# Patient Record
Sex: Male | Born: 1950 | Hispanic: No | Marital: Married | Smoking: Former smoker
Health system: Southern US, Community
[De-identification: ages and names within clinical notes are randomized; demographics above are authoritative.]

## PROBLEM LIST (undated history)

## (undated) DIAGNOSIS — I10 Essential (primary) hypertension: Secondary | ICD-10-CM

## (undated) DIAGNOSIS — T7840XA Allergy, unspecified, initial encounter: Secondary | ICD-10-CM

## (undated) DIAGNOSIS — K219 Gastro-esophageal reflux disease without esophagitis: Secondary | ICD-10-CM

## (undated) DIAGNOSIS — I2510 Atherosclerotic heart disease of native coronary artery without angina pectoris: Secondary | ICD-10-CM

## (undated) DIAGNOSIS — E785 Hyperlipidemia, unspecified: Secondary | ICD-10-CM

## (undated) DIAGNOSIS — E119 Type 2 diabetes mellitus without complications: Secondary | ICD-10-CM

## (undated) DIAGNOSIS — E11319 Type 2 diabetes mellitus with unspecified diabetic retinopathy without macular edema: Secondary | ICD-10-CM

## (undated) HISTORY — DX: Essential (primary) hypertension: I10

## (undated) HISTORY — PX: CARDIAC CATHETERIZATION: SHX172

## (undated) HISTORY — DX: Hyperlipidemia, unspecified: E78.5

## (undated) HISTORY — DX: Gastro-esophageal reflux disease without esophagitis: K21.9

## (undated) HISTORY — DX: Type 2 diabetes mellitus without complications: E11.9

## (undated) HISTORY — DX: Type 2 diabetes mellitus with unspecified diabetic retinopathy without macular edema: E11.319

## (undated) HISTORY — DX: Allergy, unspecified, initial encounter: T78.40XA

## (undated) HISTORY — DX: Atherosclerotic heart disease of native coronary artery without angina pectoris: I25.10

---

## 2004-09-01 ENCOUNTER — Ambulatory Visit: Payer: Self-pay | Admitting: Family Medicine

## 2004-09-01 ENCOUNTER — Ambulatory Visit: Payer: Self-pay | Admitting: *Deleted

## 2004-10-12 ENCOUNTER — Ambulatory Visit: Payer: Self-pay | Admitting: Family Medicine

## 2009-02-03 ENCOUNTER — Ambulatory Visit: Payer: Self-pay | Admitting: Internal Medicine

## 2009-02-04 ENCOUNTER — Encounter (INDEPENDENT_AMBULATORY_CARE_PROVIDER_SITE_OTHER): Payer: Self-pay | Admitting: Adult Health

## 2009-02-04 LAB — CONVERTED CEMR LAB
ALT: 19 units/L (ref 0–53)
Alkaline Phosphatase: 127 units/L — ABNORMAL HIGH (ref 39–117)
Basophils Absolute: 0 10*3/uL (ref 0.0–0.1)
Basophils Relative: 1 % (ref 0–1)
Chlamydia, Swab/Urine, PCR: NEGATIVE
Creatinine, Ser: 0.87 mg/dL (ref 0.40–1.50)
Eosinophils Absolute: 0.2 10*3/uL (ref 0.0–0.7)
MCHC: 33 g/dL (ref 30.0–36.0)
MCV: 82.1 fL (ref 78.0–100.0)
Monocytes Relative: 9 % (ref 3–12)
Neutro Abs: 5.5 10*3/uL (ref 1.7–7.7)
Neutrophils Relative %: 62 % (ref 43–77)
PSA: 0.18 ng/mL (ref 0.10–4.00)
Platelets: 216 10*3/uL (ref 150–400)
RBC: 5.02 M/uL (ref 4.22–5.81)
RDW: 13.8 % (ref 11.5–15.5)
Sodium: 136 meq/L (ref 135–145)
Total Bilirubin: 0.4 mg/dL (ref 0.3–1.2)
Total Protein: 7.4 g/dL (ref 6.0–8.3)

## 2009-02-05 ENCOUNTER — Ambulatory Visit: Payer: Self-pay | Admitting: Internal Medicine

## 2009-02-18 ENCOUNTER — Ambulatory Visit: Payer: Self-pay | Admitting: Internal Medicine

## 2009-02-18 ENCOUNTER — Encounter (INDEPENDENT_AMBULATORY_CARE_PROVIDER_SITE_OTHER): Payer: Self-pay | Admitting: Adult Health

## 2009-02-18 LAB — CONVERTED CEMR LAB
AST: 16 units/L (ref 0–37)
Albumin: 4 g/dL (ref 3.5–5.2)
Alkaline Phosphatase: 84 units/L (ref 39–117)
BUN: 14 mg/dL (ref 6–23)
Calcium: 9.3 mg/dL (ref 8.4–10.5)
Chloride: 102 meq/L (ref 96–112)
Glucose, Bld: 204 mg/dL — ABNORMAL HIGH (ref 70–99)
Potassium: 4.4 meq/L (ref 3.5–5.3)
Sodium: 138 meq/L (ref 135–145)
Total Protein: 6.5 g/dL (ref 6.0–8.3)

## 2009-02-19 ENCOUNTER — Encounter (INDEPENDENT_AMBULATORY_CARE_PROVIDER_SITE_OTHER): Payer: Self-pay | Admitting: Adult Health

## 2009-02-19 LAB — CONVERTED CEMR LAB
HDL: 45 mg/dL (ref >39)
LDL Cholesterol: 29 mg/dL (ref 0–99)
Triglycerides: 60 mg/dL (ref <150)

## 2009-05-05 ENCOUNTER — Ambulatory Visit: Payer: Self-pay | Admitting: Internal Medicine

## 2009-05-14 ENCOUNTER — Encounter: Payer: Self-pay | Admitting: Internal Medicine

## 2009-05-14 ENCOUNTER — Ambulatory Visit (HOSPITAL_COMMUNITY): Admission: RE | Admit: 2009-05-14 | Discharge: 2009-05-14 | Payer: Self-pay | Admitting: Internal Medicine

## 2009-06-04 ENCOUNTER — Ambulatory Visit: Payer: Self-pay | Admitting: Internal Medicine

## 2009-08-04 ENCOUNTER — Encounter (INDEPENDENT_AMBULATORY_CARE_PROVIDER_SITE_OTHER): Payer: Self-pay | Admitting: Adult Health

## 2009-08-04 ENCOUNTER — Ambulatory Visit: Payer: Self-pay | Admitting: Internal Medicine

## 2009-08-04 LAB — CONVERTED CEMR LAB
ALT: 19 units/L (ref 0–53)
CO2: 23 meq/L (ref 19–32)
Calcium: 9 mg/dL (ref 8.4–10.5)
Chloride: 107 meq/L (ref 96–112)
Cholesterol: 82 mg/dL (ref 0–200)
Glucose, Bld: 212 mg/dL — ABNORMAL HIGH (ref 70–99)
HDL: 33 mg/dL — ABNORMAL LOW (ref >39)
Helicobacter Pylori Antibody-IgG: <0.4
LDL Cholesterol: 1 mg/dL (ref 0–99)
Sodium: 140 meq/L (ref 135–145)
Total Bilirubin: 0.3 mg/dL (ref 0.3–1.2)
Total CHOL/HDL Ratio: 2.5
Total Protein: 6.8 g/dL (ref 6.0–8.3)
Triglycerides: 239 mg/dL — ABNORMAL HIGH (ref <150)
VLDL: 48 mg/dL — ABNORMAL HIGH (ref 0–40)

## 2009-09-21 ENCOUNTER — Ambulatory Visit: Payer: Self-pay | Admitting: Internal Medicine

## 2009-12-14 ENCOUNTER — Ambulatory Visit: Payer: Self-pay | Admitting: Internal Medicine

## 2009-12-24 ENCOUNTER — Ambulatory Visit: Payer: Self-pay | Admitting: Internal Medicine

## 2010-01-13 ENCOUNTER — Ambulatory Visit: Payer: Self-pay | Admitting: Internal Medicine

## 2010-02-11 ENCOUNTER — Ambulatory Visit: Payer: Self-pay | Admitting: Internal Medicine

## 2010-02-11 ENCOUNTER — Encounter (INDEPENDENT_AMBULATORY_CARE_PROVIDER_SITE_OTHER): Payer: Self-pay | Admitting: Adult Health

## 2010-02-11 LAB — CONVERTED CEMR LAB
ALT: 19 units/L (ref 0–53)
AST: 19 units/L (ref 0–37)
Alkaline Phosphatase: 84 units/L (ref 39–117)
Creatinine, Ser: 0.93 mg/dL (ref 0.40–1.50)
Total Bilirubin: 0.4 mg/dL (ref 0.3–1.2)
Total CHOL/HDL Ratio: 2.7
VLDL: 15 mg/dL (ref 0–40)

## 2010-04-14 ENCOUNTER — Ambulatory Visit: Payer: Self-pay | Admitting: Internal Medicine

## 2010-04-14 ENCOUNTER — Encounter (INDEPENDENT_AMBULATORY_CARE_PROVIDER_SITE_OTHER): Payer: Self-pay | Admitting: Adult Health

## 2010-04-14 LAB — CONVERTED CEMR LAB: Microalb, Ur: 2.75 mg/dL — ABNORMAL HIGH (ref 0.00–1.89)

## 2010-08-26 ENCOUNTER — Encounter (INDEPENDENT_AMBULATORY_CARE_PROVIDER_SITE_OTHER): Payer: Self-pay | Admitting: *Deleted

## 2010-08-26 LAB — CONVERTED CEMR LAB
Calcium: 9.2 mg/dL (ref 8.4–10.5)
Creatinine, Ser: 1.13 mg/dL (ref 0.40–1.50)
Hgb A1c MFr Bld: 9.9 % — ABNORMAL HIGH (ref <5.7)
Sodium: 133 meq/L — ABNORMAL LOW (ref 135–145)

## 2015-05-13 ENCOUNTER — Ambulatory Visit (INDEPENDENT_AMBULATORY_CARE_PROVIDER_SITE_OTHER): Payer: Medicaid Other | Admitting: Family Medicine

## 2015-05-13 ENCOUNTER — Encounter: Payer: Self-pay | Admitting: Family Medicine

## 2015-05-13 VITALS — BP 112/58 | HR 76 | Temp 98.5°F | Ht 68.0 in | Wt 147.0 lb

## 2015-05-13 DIAGNOSIS — Z7189 Other specified counseling: Secondary | ICD-10-CM | POA: Diagnosis present

## 2015-05-13 DIAGNOSIS — E785 Hyperlipidemia, unspecified: Secondary | ICD-10-CM | POA: Diagnosis not present

## 2015-05-13 DIAGNOSIS — I10 Essential (primary) hypertension: Secondary | ICD-10-CM

## 2015-05-13 DIAGNOSIS — E1169 Type 2 diabetes mellitus with other specified complication: Secondary | ICD-10-CM | POA: Diagnosis not present

## 2015-05-13 DIAGNOSIS — R05 Cough: Secondary | ICD-10-CM

## 2015-05-13 DIAGNOSIS — J302 Other seasonal allergic rhinitis: Secondary | ICD-10-CM | POA: Diagnosis not present

## 2015-05-13 DIAGNOSIS — Z7689 Persons encountering health services in other specified circumstances: Secondary | ICD-10-CM

## 2015-05-13 DIAGNOSIS — K219 Gastro-esophageal reflux disease without esophagitis: Secondary | ICD-10-CM | POA: Insufficient documentation

## 2015-05-13 DIAGNOSIS — R059 Cough, unspecified: Secondary | ICD-10-CM | POA: Insufficient documentation

## 2015-05-13 DIAGNOSIS — E11319 Type 2 diabetes mellitus with unspecified diabetic retinopathy without macular edema: Secondary | ICD-10-CM

## 2015-05-13 NOTE — Progress Notes (Signed)
Patient ID: Alec York, male   DOB: Dec 08, 1950, 64 y.o.   MRN: [id number]    Subjective: XO:VANVBTYO HPI: Patient is a 64 y.o. male presenting to clinic today to establish care/diabetes management. Concerns today include:  1. Diabetes:  High at home: 315 Low at home: 150 Taking medications: Novolog 12u BID, Lantus 50u qhs, Amaryl 4mg  BID, Zocor 40, Lisinopril 40 Side effects: Last hypoglycemic episode ~4 months ago.   ROS: denies fever, chills, dizziness, LOC, polyuria, polydipsia, or chest pain. Endorses occasional numbness and tingling in LE but no ulcers or callus formation.  Patient has diabetic retinopathy and is followed by an eye doctor. Last eye exam: 08/2014 Last foot exam: 03/18/15 Last A1c: 12.8 (03/18/15) Nephropathy screen indicated?: no on ACE-I Last flu, zoster and/or pneumovax: flu shot last year, also has received pneumonia shot at some point.  Unsure of zoster.   2. Cough: Patient has had an ongoing dry cough for about 15 days.  Has used a cough medicine with DM in it with little relief.  He denies fevers, chills, weight loss, sick contacts, CP, SOB, wheeze, rhinorrhea.  Former smoker.  Exercises daily without difficulty.  Social History Reviewed: former smoker. FamHx and MedHx updated.  Please see EMR.  ROS: All other systems reviewed and are negative.  Objective: Office vital signs reviewed. BP 112/58 mmHg  Pulse 76  Temp(Src) 98.5 F (36.9 C) (Oral)  Ht 5\' 8"  (1.727 m)  Wt 147 lb (66.679 kg)  BMI 22.36 kg/m2  Physical Examination:  General: Awake, alert, well nourished, NAD HEENT: Normal, EOMI, MMM, poor dentition with caries and teeth missing Neck: supple, no LAD Cardio: RRR, S1S2 heard, no murmurs appreciated Pulm: CTAB, no wheezes, rhonchi or rales GI: soft, NT/ND,+BS x4, no hepatomegaly, no splenomegaly Extremities: WWP, No edema, cyanosis or clubbing; +2 pulses bilaterally MSK: Normal gait and station Skin: dry, intact, no rashes or  lesions  Assessment: 64 y.o. male with T2DM, HTN, HLD, diabetic retinopathy here to establish care.  Plan: See Problem List and After Visit Summary   Janora Norlander, DO PGY-1, [place]

## 2015-05-13 NOTE — Patient Instructions (Addendum)
It was a pleasure seeing you today, Mr Drone!  Information regarding what we discussed is included in this packet.  Please make an appointment to see me in 2 months for diabetes follow up.  We will check your hemoglobin A1c at that time.  In the meantime, continue taking your Diabetes medications as directed.  Continue yoga and diet modification.  We may need to refer you to an endocrinologist if your A1c has not improved on current therapies.  Take honey for your cough.  You may use Robitussin (PLAIN, no DM) for cough if honey does not help.  Please feel free to call our office at [phone] if any questions or concerns arise.  Warm Regards, Ashly M. Lajuana Ripple, DO

## 2015-05-13 NOTE — Assessment & Plan Note (Signed)
15 days.  No URI signs.  Possibly allergy related.  Pt with h/o smoking.  If not improved, would consider imaging.  No red flags at this time. -Honey.  If no relief with honey, use Robitussin PLAIN.  Avoid D and DM in setting of HTN -Return precautions reviewed.

## 2015-05-13 NOTE — Assessment & Plan Note (Signed)
Controlled.  Somewhat soft for age [age]. -Continue Lisinopril 40mg  for now.   -Will consider decreasing this if continued soft pressures at next visit. -Continue Yoga and diet modification -Return in 2 months for T2DM check

## 2015-05-13 NOTE — Assessment & Plan Note (Signed)
Last A1c 12.8 02/2015.  Lipid panel WNL.  Zocor 40mg , Lisinopril 40mg , Novolog, Lantus, Amaryl.  Not on aspirin.  No lows. -Would recommend addition of Aspirin 81mg  for primary prevention -Continue current therapies.   -Would consider changing Zocor to a statin with less medication interactions.  Patient tolerating medication. -Will repeat A1c in 2 months.   -Discussed consideration for endocrinologist referral if A1c continues to be high on multitherapies -Patient signed release of information form.  Would like to know what medications have tried/responses/failures/ which vaccines needed, etc -Will administer pna vaccine and give Rx for Zoster if not obtained -Patient to schedule appt with opthalomologist for continued f/u  -Return in 2 months for diabetes management.

## 2015-05-26 ENCOUNTER — Telehealth: Payer: Self-pay | Admitting: Family Medicine

## 2015-05-26 ENCOUNTER — Other Ambulatory Visit: Payer: Self-pay | Admitting: Family Medicine

## 2015-05-26 DIAGNOSIS — Z1211 Encounter for screening for malignant neoplasm of colon: Secondary | ICD-10-CM

## 2015-05-26 NOTE — Telephone Encounter (Signed)
Patient is calling about a gastroenterology (GI) appt that was supposed to be made for him. He says that he has been waiting on a phone call about this. He attempted to make the appt himself but he needs the referral/authorization placed for his insurance. Thank you, Fonda Kinder, ASA

## 2015-05-26 NOTE — Telephone Encounter (Signed)
PT informed that referral has been placed and that someone will be in contact with him. Katharina Caper, April D, [place]

## 2015-05-26 NOTE — Telephone Encounter (Signed)
Referral placed.

## 2015-05-29 ENCOUNTER — Ambulatory Visit: Payer: Medicaid Other | Admitting: Family Medicine

## 2015-06-01 ENCOUNTER — Encounter: Payer: Self-pay | Admitting: Family Medicine

## 2015-06-01 ENCOUNTER — Ambulatory Visit (INDEPENDENT_AMBULATORY_CARE_PROVIDER_SITE_OTHER): Payer: Medicare Other | Admitting: Family Medicine

## 2015-06-01 VITALS — BP 121/51 | HR 74 | Temp 97.7°F | Wt 149.5 lb

## 2015-06-01 DIAGNOSIS — R05 Cough: Secondary | ICD-10-CM

## 2015-06-01 DIAGNOSIS — M7551 Bursitis of right shoulder: Secondary | ICD-10-CM | POA: Insufficient documentation

## 2015-06-01 DIAGNOSIS — R059 Cough, unspecified: Secondary | ICD-10-CM

## 2015-06-01 DIAGNOSIS — J309 Allergic rhinitis, unspecified: Secondary | ICD-10-CM | POA: Insufficient documentation

## 2015-06-01 MED ORDER — NAPROXEN 500 MG PO TABS: 500.0000 mg | ORAL_TABLET | Freq: Two times a day (BID) | ORAL | Status: DC

## 2015-06-01 MED ORDER — BENZONATATE 100 MG PO CAPS: 100.0000 mg | ORAL_CAPSULE | Freq: Three times a day (TID) | ORAL | Status: DC | PRN

## 2015-06-01 MED ORDER — MOMETASONE FUROATE 50 MCG/ACT NA SUSP: 2.0000 | Freq: Every day | NASAL | Status: DC

## 2015-06-01 NOTE — Patient Instructions (Signed)
Thank you for coming in to clinic today.  1. For your Right Shoulder Pain - I think this is bursitis due to joint inflammation. - Take Naprosyn 500mg  twice daily (with food) for 2 weeks, then as needed - May take Tylenol Extra Strength 500mg  pills (take 1-2 tablets) every 6 hours as needed for pain - Stay active , do not lift heavy objects - use Heating pad as needed 2. For cough - I think due to allergies - Start nasonex nasal spray for next 1 month, 2 sprays in each nostril daily - Given Tessalon for cough, use as needed - May try over the counter Claritin 10mg  daily  Call [facility] with your questions prior to Endoscopy  Please schedule a follow-up appointment with Dr. Lajuana Ripple in 1 month to follow-up Shoulder  If you have any other questions or concerns, please feel free to call the clinic to contact me. You may also schedule an earlier appointment if necessary.  However, if your symptoms get significantly worse, please go to the Emergency Department to seek immediate medical attention.  Alec York, [place]

## 2015-06-01 NOTE — Progress Notes (Signed)
   Subjective:    Patient ID: Alec York, male    DOB: February 21, 1951, 64 y.o.   MRN: [id number]  Patient presents for a same day appointment.  HPI  RIGHT UPPER ARM / SHOULDER PAIN: - Reports symptoms of Right shoulder pain started 1 month ago. Previously had similar pain in Left shoulder since resolved over past few months. Denies any prior shoulder injury, surgery. States pain in the muscles from front R-shoulder to elbow but does not radiate into lower arm or hand. Describes constant pain 8/10, worse with activity or lifting, occasionally wakes up from sleep. - Currently disabled due to vision problem, not employed. Not currently doing any strenuous activities or occupation, no known scenario that could potentially have suffered trauma or injury - Took one Tylenol pill with good relief, otherwise has not taken any other meds for this. No topical therapy. - Admits stiffness in Right shoulder - Denies any weakness, numbness, tingling, pain in other joints, rash or erythema  COUGH: - Last seen at [facility] 05/13/15 for same complaint. Continues to complain of non-productive cough, for past 1 month, seems to be about the same. No significant associated symptoms. No previous URI or sinus infection. - H/o lisinopril x 3 years without problem, no prior cough or reaction. - No other sick contacts. No history of TB or known exposures. - Denies any fevers/chills, SOB, CP, sore throat  I have reviewed and updated the following as appropriate: allergies and current medications  Social Hx: - Former smoker - previously smoked 5 years ago for about 10-15 years  Review of Systems  See above HPI    Objective:   Physical Exam  BP 121/51 mmHg  Pulse 74  Temp(Src) 97.7 F (36.5 C) (Oral)  Wt 149 lb 8 oz (67.813 kg)  Gen - well-appearing, comfortable, cooperative, NAD HEENT - oropharynx clear, MMM Neck - supple, full active ROM, non-tender, Spurling's negative for radiculopathy Lungs - CTAB, no  wheezing, crackles, or rhonchi. Normal work of breathing. MSK - Right Shoulder: no deformity visible, stable to Left. Mild +TTP anterior shoulder region without any posterior tenderness or C/T-spine tenderness. Limited active ROM forward flex limited by pain above shoulder compared to Left (normal full active ROM all ranges), abduction up to shoulder only, pain with internal rotation behind back. 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 Skin - warm, dry, no rashes Neuro - intact distal sensation to light touch     Assessment & Plan:   See specific A&P problem list for details.

## 2015-06-01 NOTE — Assessment & Plan Note (Addendum)
Persistent chronic non-productive dry cough x 1.5 months, same complaint from last visit. Exam and rest of history unremarkable. Lungs clear. Considered ACEi etiology (on Lisinopril >3 years, still may be possibility), also with h/o allergies previously may be due to postnasal drainage w/o active sinusitis / URI.  Plan: 1. Start Nasonex 2 sprays daily up to 1 month 2. Try OTC claritin vs zyrtec x 1 month 3. Start OTC Nasal Saline 4. Given rx Tessalon 100mg  TID PRN 5. RTC 1 mo, consider DC ACEi and switch to ARB if no further etiology clear

## 2015-06-01 NOTE — Assessment & Plan Note (Addendum)
Consistent with subacute R-shoulder bursitis vs rotator cuff tendinopathy with some reduced active ROM but without significant evidence of muscle tear (no weakness). No clear etiology of injury, but pt 46 yr, may have underlying arthritis - No imaging on chart - Inadequate medical therapy x 1 month  Plan: 1. Start Naprosyn Naprosyn 500mg  twice daily (with food) for 2 weeks, then as needed 2. May take Tylenol Ex Str 500mg  (take 1-2 tablets) q 6 hr PRN 3. Relative rest but keep shoulder mobile, demonstrated ROM exercises, avoid heavy lifting 4. May try heating pad PRN 5. RTC 1 month re-evaluation, if not improved consider subacromial steroid inj, X-rays eval for arthritis. If worsening night-time symptoms or weakness, consider referral to [facility] for rotator cuff eval.

## 2015-06-04 DIAGNOSIS — K2980 Duodenitis without bleeding: Secondary | ICD-10-CM | POA: Diagnosis not present

## 2015-06-04 DIAGNOSIS — K449 Diaphragmatic hernia without obstruction or gangrene: Secondary | ICD-10-CM | POA: Diagnosis not present

## 2015-06-04 DIAGNOSIS — D509 Iron deficiency anemia, unspecified: Secondary | ICD-10-CM | POA: Diagnosis not present

## 2015-06-04 DIAGNOSIS — K259 Gastric ulcer, unspecified as acute or chronic, without hemorrhage or perforation: Secondary | ICD-10-CM | POA: Diagnosis not present

## 2015-06-08 DIAGNOSIS — E11351 Type 2 diabetes mellitus with proliferative diabetic retinopathy with macular edema: Secondary | ICD-10-CM | POA: Diagnosis not present

## 2015-06-08 DIAGNOSIS — H3582 Retinal ischemia: Secondary | ICD-10-CM | POA: Diagnosis not present

## 2015-06-19 DIAGNOSIS — E11351 Type 2 diabetes mellitus with proliferative diabetic retinopathy with macular edema: Secondary | ICD-10-CM | POA: Diagnosis not present

## 2015-08-05 DIAGNOSIS — H43391 Other vitreous opacities, right eye: Secondary | ICD-10-CM | POA: Diagnosis not present

## 2015-08-05 DIAGNOSIS — H3582 Retinal ischemia: Secondary | ICD-10-CM | POA: Diagnosis not present

## 2015-08-05 DIAGNOSIS — E11351 Type 2 diabetes mellitus with proliferative diabetic retinopathy with macular edema: Secondary | ICD-10-CM | POA: Diagnosis not present

## 2015-08-05 DIAGNOSIS — E11359 Type 2 diabetes mellitus with proliferative diabetic retinopathy without macular edema: Secondary | ICD-10-CM | POA: Diagnosis not present

## 2015-08-19 ENCOUNTER — Ambulatory Visit (INDEPENDENT_AMBULATORY_CARE_PROVIDER_SITE_OTHER): Payer: Medicare Other | Admitting: Family Medicine

## 2015-08-19 ENCOUNTER — Encounter: Payer: Self-pay | Admitting: Family Medicine

## 2015-08-19 VITALS — BP 125/62 | HR 103 | Wt 174.4 lb

## 2015-08-19 DIAGNOSIS — M7501 Adhesive capsulitis of right shoulder: Secondary | ICD-10-CM | POA: Insufficient documentation

## 2015-08-19 DIAGNOSIS — E11319 Type 2 diabetes mellitus with unspecified diabetic retinopathy without macular edema: Secondary | ICD-10-CM | POA: Diagnosis not present

## 2015-08-19 DIAGNOSIS — I10 Essential (primary) hypertension: Secondary | ICD-10-CM

## 2015-08-19 DIAGNOSIS — Z23 Encounter for immunization: Secondary | ICD-10-CM

## 2015-08-19 DIAGNOSIS — E11311 Type 2 diabetes mellitus with unspecified diabetic retinopathy with macular edema: Secondary | ICD-10-CM | POA: Diagnosis present

## 2015-08-19 LAB — POCT GLYCOSYLATED HEMOGLOBIN (HGB A1C): Hemoglobin A1C: 10.7

## 2015-08-19 NOTE — Patient Instructions (Addendum)
It was a pleasure seeing you today, Mr Menken.  I have place a referral to Sports Medicine for your shoulder and to Endocrinology for your Diabetes. Please make an appointment to see me in 3 months.  Please feel free to call our office at [phone] if any questions or concerns arise.  Warm Regards, Ashly M. Gottschalk, DO Diabetes and Foot Care Diabetes may cause you to have problems because of poor blood supply (circulation) to your feet and legs. This may cause the skin on your feet to become thinner, break easier, and heal more slowly. Your skin may become dry, and the skin may peel and crack. You may also have nerve damage in your legs and feet causing decreased feeling in them. You may not notice minor injuries to your feet that could lead to infections or more serious problems. Taking care of your feet is one of the most important things you can do for yourself.  HOME CARE INSTRUCTIONS  Wear shoes at all times, even in the house. Do not go barefoot. Bare feet are easily injured.  Check your feet daily for blisters, cuts, and redness. If you cannot see the bottom of your feet, use a mirror or ask someone for help.  Wash your feet with warm water (do not use hot water) and mild soap. Then pat your feet and the areas between your toes until they are completely dry. Do not soak your feet as this can dry your skin.  Apply a moisturizing lotion or petroleum jelly (that does not contain alcohol and is unscented) to the skin on your feet and to dry, brittle toenails. Do not apply lotion between your toes.  Trim your toenails straight across. Do not dig under them or around the cuticle. File the edges of your nails with an emery board or nail file.  Do not cut corns or calluses or try to remove them with medicine.  Wear clean socks or stockings every day. Make sure they are not too tight. Do not wear knee-high stockings since they may decrease blood flow to your legs.  Wear shoes that fit  properly and have enough cushioning. To break in new shoes, wear them for just a few hours a day. This prevents you from injuring your feet. Always look in your shoes before you put them on to be sure there are no objects inside.  Do not cross your legs. This may decrease the blood flow to your feet.  If you find a minor scrape, cut, or break in the skin on your feet, keep it and the skin around it clean and dry. These areas may be cleansed with mild soap and water. Do not cleanse the area with peroxide, alcohol, or iodine.  When you remove an adhesive bandage, be sure not to damage the skin around it.  If you have a wound, look at it several times a day to make sure it is healing.  Do not use heating pads or hot water bottles. They may burn your skin. If you have lost feeling in your feet or legs, you may not know it is happening until it is too late.  Make sure your health care Tarea Skillman performs a complete foot exam at least annually or more often if you have foot problems. Report any cuts, sores, or bruises to your health care Christella App immediately. SEEK MEDICAL CARE IF:   You have an injury that is not healing.  You have cuts or breaks in the skin.  You have an ingrown nail.  You notice redness on your legs or feet.  You feel burning or tingling in your legs or feet.  You have pain or cramps in your legs and feet.  Your legs or feet are numb.  Your feet always feel cold. SEEK IMMEDIATE MEDICAL CARE IF:   There is increasing redness, swelling, or pain in or around a wound.  There is a red line that goes up your leg.  Pus is coming from a wound.  You develop a fever or as directed by your health care Sabel Hornbeck.  You notice a bad smell coming from an ulcer or wound. Document Released: 11/04/2000 Document Revised: 07/10/2013 Document Reviewed: 04/16/2013 [facility] Patient Information 2015 [place], [place]. This information is not intended to replace advice given to you by your  health care Emmery Seiler. Make sure you discuss any questions you have with your health care Jewelz Kobus.

## 2015-08-19 NOTE — Progress Notes (Signed)
    Subjective:  CC:DM follow up HPI: Patient is a 64 y.o. male presenting to clinic today for follow up. Concerns today include:  1. Diabetes:  High at home: 300's when forgets insulin (otherwise 140-150's in am) Low at home: 120 Taking medications: Janumet, Amaryl, Lantus 50u HS, Novolog 20u QAM Side effects: none ROS: denies fever, chills, dizziness, LOC, chest pain.  Endorses numbness or tingling in extremities, polyuria, polydipsia Last eye exam: 06/2015.   Last foot exam: will perform today Last A1c: 02/2015 (10.2) Nephropathy screen indicated?: ACEi (01/2015 microalb 2.7)  Last flu, zoster and/or pneumovax: today flu  2. R shoulder pain Has had for about 4 months.  Has gotten worse over last 2 months.  Was seen in clinic and given Naproxen 500mg , which did not help.  Denies injury.   Cannot sleep on R shoulder.  Pain is described as achy in nature.  Pain is worse with trying to lift arm over head.  No overt pain in any other joints.  No fevers.  Endorses weakness in R forearm and numbness in tingling in both hands.  3. HTN Compliant with ACEi.  No concerns.  Denies CP, SOB, headache, edema.  Social History Reviewed: non smoker. FamHx and MedHx updated.  Please see EMR. Health Maintenance: Flu shot today  ROS: All other systems reviewed and are negative.  Objective: Office vital signs reviewed. BP 125/62 mmHg  Pulse 103  Wt 174 lb 6.4 oz (79.107 kg)  Physical Examination:  General: Awake, alert, well nourished, NAD HEENT: Normal, MMM Cardio: RRR, S1S2 heard, no murmurs appreciated Pulm: CTAB, no wheezes, rhonchi or rales, normal WOB Extremities: WWP, No edema, cyanosis or clubbing; +2 pulses bilaterally  RUE: AROM limited in flexion, abduction and extension/IR.  Strength 5/5 in all planes.  Pain with empty can test, Hawkins and crossover testing.  No scapular winging.  No ecchymosis or edema. MSK: Normal gait and station Neuro: Strength and sensation grossly intact,  follows commands  Results for orders placed or performed in visit on 08/19/15 (from the past 24 hour(s))  HgB A1c     Status: Abnormal   Collection Time: 08/19/15  9:50 AM  Result Value Ref Range   Hemoglobin A1C 10.7    Assessment/ Plan: 64 y.o. male with  Essential hypertension BP well controlled. -Continue ACEi  Type 2 diabetes mellitus with diabetic retinopathy A1c worsening today 10.7 from 10.2 in April.  Unsure if patient is simply not compliant (as he admits to forgetting to take meds at times) vs not monitoring diet vs refractory to multi therapies -Referral to endocrinology placed today -Flu shot given.  Will need 2nd PNA shot after 67 -DM foot exam performed today.  No ulcerations but decreased monofilament sensation. -Return in 3 months or sooner if needed.  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   -Patient to stop Naproxen and continue Motrin PRN -Home exercises provided -Referral to Sports medicine for evaluation/possible injection -Return precautions reviewed -Follow up PRN   Janora Norlander, DO PGY-2, [place]

## 2015-08-19 NOTE — Assessment & Plan Note (Signed)
BP well controlled. -Continue ACEi

## 2015-08-19 NOTE — Assessment & Plan Note (Signed)
A1c worsening today 10.7 from 10.2 in April.  Unsure if patient is simply not compliant (as he admits to forgetting to take meds at times) vs not monitoring diet vs refractory to multi therapies -Referral to endocrinology placed today -Flu shot given.  Will need 2nd PNA shot after 67 -DM foot exam performed today.  No ulcerations but decreased monofilament sensation. -Return in 3 months or sooner if needed.

## 2015-08-19 NOTE — Assessment & Plan Note (Signed)
Suspect frozen shoulder.   -Patient to stop Naproxen and continue Motrin PRN -Home exercises provided -Referral to Sports medicine for evaluation/possible injection -Return precautions reviewed -Follow up PRN

## 2015-08-20 ENCOUNTER — Ambulatory Visit: Payer: Medicare Other | Admitting: Family Medicine

## 2015-09-02 ENCOUNTER — Ambulatory Visit: Payer: Medicare Other | Admitting: Endocrinology

## 2015-09-02 ENCOUNTER — Ambulatory Visit (INDEPENDENT_AMBULATORY_CARE_PROVIDER_SITE_OTHER): Payer: Medicare Other | Admitting: Endocrinology

## 2015-09-02 ENCOUNTER — Encounter: Payer: Self-pay | Admitting: Endocrinology

## 2015-09-02 VITALS — BP 118/76 | HR 87 | Temp 98.2°F | Ht 68.5 in | Wt 151.0 lb

## 2015-09-02 DIAGNOSIS — E11319 Type 2 diabetes mellitus with unspecified diabetic retinopathy without macular edema: Secondary | ICD-10-CM

## 2015-09-02 DIAGNOSIS — R9431 Abnormal electrocardiogram [ECG] [EKG]: Secondary | ICD-10-CM | POA: Insufficient documentation

## 2015-09-02 NOTE — Patient Instructions (Addendum)
Please see a heart specialist.  you will receive a phone call, about a day and time for an appointment. good diet and exercise significantly improve the control of your diabetes.  please let me know if you wish to be referred to a dietician.  high blood sugar is very risky to your health.  you should see an eye doctor and dentist every year.  It is very important to get all recommended vaccinations.  controlling your blood pressure and cholesterol drastically reduces the damage diabetes does to your body.  Those who smoke should quit.  please discuss these with your doctor.   check your blood sugar twice a day.  vary the time of day when you check, between before the 3 meals, and at bedtime.  also check if you have symptoms of your blood sugar being too high or too low.  please keep a record of the readings and bring it to your next appointment here (or you can bring the meter itself).  You can write it on any piece of paper.  please call us sooner if your blood sugar goes below 70, or if you have a lot of readings over 200.   i think it is safe to go off the insulin.   In fact, you should stop taking the diabetes pills.   Please take the insulin as prescribed, and try not to miss any shots.   Please come back for a follow-up appointment in 1 month.

## 2015-09-02 NOTE — Progress Notes (Signed)
Subjective:    Patient ID: Alec York, male    DOB: 10-30-1951, 64 y.o.   MRN: [id number]  HPI pt states DM was dx'ed in 1990; he has mild neuropathy of the lower extremities; he has associated retinopathy and nephropathy; he has been on insulin since 2009; pt says his diet is good, but exercise is poor; he has never had pancreatitis, severe hypoglycemia or DKA.  He says cbg's are well-controlled when he takes insulin as rx'ed, but he often misses it.  He wants to go off insulin if possible.   Past Medical History  Diagnosis Date  . Diabetes mellitus without complication ([place])   . Hypertension   . Hyperlipidemia   . Diabetic retinopathy ([place])     Disabled 2/2 retinopathy  . GERD (gastroesophageal reflux disease)   . Allergy     No past surgical history on file.  Social History   Social History  . Marital Status: Married    Spouse Name: N/A  . Number of Children: N/A  . Years of Education: N/A   Occupational History  . Not on file.   Social History Main Topics  . Smoking status: Former Research scientist (life sciences)  . Smokeless tobacco: Never Used  . Alcohol Use: No  . Drug Use: No  . Sexual Activity: Yes    Birth Control/ Protection: None   Other Topics Concern  . Not on file   Social History Narrative    Current Outpatient Prescriptions on File Prior to Visit  Medication Sig Dispense Refill  . aspirin 81 MG tablet Take 81 mg by mouth daily.    . insulin aspart (NOVOLOG) 100 UNIT/ML injection Inject 12 Units into the skin 2 (two) times daily after a meal.    . insulin glargine (LANTUS) 100 UNIT/ML injection Inject 50 Units into the skin at bedtime.    [name] lisinopril (PRINIVIL,ZESTRIL) 40 MG tablet Take 40 mg by mouth daily.    . mometasone (NASONEX) 50 MCG/ACT nasal spray Place 2 sprays into the nose daily. 17 g 2  . naproxen (NAPROSYN) 500 MG tablet Take 1 tablet (500 mg total) by mouth 2 (two) times daily with a meal. (Patient not taking: Reported on 09/02/2015) 60 tablet 0  .  simvastatin (ZOCOR) 40 MG tablet Take 40 mg by mouth daily.     No current facility-administered medications on file prior to visit.    No Known Allergies  Family History  Problem Relation Age of Onset  . Diabetes Neg Hx     BP 118/76 mmHg  Pulse 87  Temp(Src) 98.2 F (36.8 C) (Oral)  Ht 5' 8.5" (1.74 m)  Wt 151 lb (68.493 kg)  BMI 22.62 kg/m2  SpO2 85%  Review of Systems denies weight loss, headache, chest pain, sob, n/v, urinary frequency, muscle cramps, excessive diaphoresis, depression, cold intolerance, rhinorrhea, and easy bruising.  He has chronic visual loss.    Objective:   Physical Exam VS: see vs page GEN: no distress HEAD: head: no deformity eyes: no periorbital swelling, no proptosis external nose and ears are normal mouth: no lesion seen NECK: supple, thyroid is not enlarged CHEST WALL: no deformity LUNGS: clear to auscultation BREASTS:  No gynecomastia CV: reg rate and rhythm, no murmur ABD: abdomen is soft, nontender.  no hepatosplenomegaly.  not distended.  no hernia MUSCULOSKELETAL: muscle bulk and strength are grossly normal.  no obvious joint swelling.  gait is normal and steady EXTEMITIES: no deformity.  no ulcer on the feet.  feet  are of normal color and temp.  no edema.  There is bilateral onychomycosis of the toenails PULSES: dorsalis pedis intact bilat.  no carotid bruit NEURO:  cn 2-12 grossly intact.   readily moves all 4's.  sensation is intact to touch on the feet, but decreased from normal.   SKIN:  Normal texture and temperature.  No rash or suspicious lesion is visible.  There is hyperpigmentation of the legs and feet NODES:  None palpable at the neck. PSYCH: alert, well-oriented.  Does not appear anxious nor depressed.     Lab Results  Component Value Date   HGBA1C 10.7 08/19/2015  i personally reviewed electrocardiogram tracing (today): Indication: DM Impression: inverted T-waves laterally  I have reviewed outside records, and  summarized: Pt was noted to have severely elevated a1c, and ref here    Assessment & Plan:  DM: severe exacerbation.  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  Abnormal ecg, new: he is asymptomatic, but the changes are focal.    Patient is advised the following: Patient Instructions  Please see a heart specialist.  you will receive a phone call, about a day and time for an appointment. good diet and exercise significantly improve the control of your diabetes.  please let me know if you wish to be referred to a dietician.  high blood sugar is very risky to your health.  you should see an eye doctor and dentist every year.  It is very important to get all recommended vaccinations.  controlling your blood pressure and cholesterol drastically reduces the damage diabetes does to your body.  Those who smoke should quit.  please discuss these with your doctor.   check your blood sugar twice a day.  vary the time of day when you check, between before the 3 meals, and at bedtime.  also check if you have symptoms of your blood sugar being too high or too low.  please keep a record of the readings and bring it to your next appointment here (or you can bring the meter itself).  You can write it on any piece of paper.  please call us sooner if your blood sugar goes below 70, or if you have a lot of readings over 200.   i think it is safe to go off the insulin.   In fact, you should stop taking the diabetes pills.   Please take the insulin as prescribed, and try not to miss any shots.   Please come back for a follow-up appointment in 1 month.     correction: "it is not safe to go off the insulin."

## 2015-09-03 ENCOUNTER — Other Ambulatory Visit: Payer: Self-pay | Admitting: *Deleted

## 2015-09-03 ENCOUNTER — Telehealth: Payer: Self-pay | Admitting: Endocrinology

## 2015-09-03 MED ORDER — LISINOPRIL 40 MG PO TABS: 40.0000 mg | ORAL_TABLET | Freq: Every day | ORAL | Status: DC

## 2015-09-03 NOTE — Telephone Encounter (Signed)
Pt has been made aware of the correction on the AVS

## 2015-09-03 NOTE — Telephone Encounter (Signed)
please call patient: There is a misprint on yesterday's avs: It should say "it is not safe to go off the insulin." i'll see you next time.

## 2015-09-09 ENCOUNTER — Ambulatory Visit: Payer: Medicare Other | Admitting: Endocrinology

## 2015-09-09 ENCOUNTER — Ambulatory Visit (INDEPENDENT_AMBULATORY_CARE_PROVIDER_SITE_OTHER): Payer: Medicare Other | Admitting: Sports Medicine

## 2015-09-09 ENCOUNTER — Encounter: Payer: Self-pay | Admitting: Sports Medicine

## 2015-09-09 VITALS — BP 128/57 | Ht 68.5 in | Wt 150.0 lb

## 2015-09-09 DIAGNOSIS — M7501 Adhesive capsulitis of right shoulder: Secondary | ICD-10-CM | POA: Diagnosis present

## 2015-09-09 MED ORDER — NORTRIPTYLINE HCL 25 MG PO CAPS: 25.0000 mg | ORAL_CAPSULE | Freq: Every day | ORAL | Status: DC

## 2015-09-09 NOTE — Progress Notes (Signed)
   Subjective:    Patient ID: Alec York, male    DOB: October 18, 1951, 64 y.o.   MRN: [id number]  HPI chief complaint: Right shoulder pain and stiffness  64 year old male comes in today complaining of 2 months of worsening right shoulder pain and stiffness. No trauma that he can recall but a gradual onset of pain that is diffuse around the shoulder . He has become quite limited in his use of his right arm due to his stiffness. He gets pain at night as well. He denies any problems with his shoulder in the past. Denies pain in the left shoulder. No numbness or tingling. No prior shoulder surgeries. He has tried naproxen sodium without any pain relief.  Past medical history reviewed. It is most significant for severely uncontrolled diabetes mellitus. Medications reviewed Allergies reviewed    Review of Systems    as above Objective:   Physical Exam Well-developed, well-nourished. No acute distress. Awake alert and oriented 3. Vital signs reviewed.  Right shoulder: No atrophy. Patient has severely limited range of motion actively and passively in all planes. No tenderness to palpation. No erythema. Rotator cuff strength is difficult to assess due to his limited mobility. Good radial and ulnar pulses distally  Left shoulder: Full painless range of motion. Good strength. No signs of impingement. Neurovascularly intact distally.       Assessment & Plan:  Right shoulder pain secondary to adhesive capsulitis  Patient has rather pronounced adhesive capsulitis. I think he would benefit from an intra-articular cortisone injection but I'm hesitant to do this with his uncontrolled diabetes. Instead I will place him on 25 mg of amitriptyline daily at bedtime. He will take this for a week and if he is still having pain then he can increase his dose to 50 mg daily at bedtime. He will start physical therapy and will follow-up with me in one month. He has an appointment to follow-up with his  endocrinologist prior to his follow-up visit with me. If his endocrinologist feels that it is safe to perform an intra-articular cortisone injection that I will be happy to do so at his follow-up visit. I did reassure the patient that adhesive capsulitis does resolve spontaneously even without any specific treatment although it may take several months for this to happen. His uncontrolled diabetes also makes him a poor surgical candidate.

## 2015-09-16 ENCOUNTER — Ambulatory Visit: Payer: Medicare Other | Admitting: Sports Medicine

## 2015-09-22 ENCOUNTER — Telehealth: Payer: Self-pay | Admitting: Endocrinology

## 2015-09-22 NOTE — Telephone Encounter (Signed)
New Message  This message is to inform you that we have made 3 consecutive attempts to contact the patient since 09/03/2015. We have also mailed a letter to the patient to inform them to call in and schedule. Although we were unsuccessful in these attempts we wanted you to be aware of our efforts. Will remove the patient from our referral work queue at this time    Jarold Motto [facility]

## 2015-09-23 DIAGNOSIS — E113591 Type 2 diabetes mellitus with proliferative diabetic retinopathy without macular edema, right eye: Secondary | ICD-10-CM | POA: Diagnosis not present

## 2015-09-23 DIAGNOSIS — H3582 Retinal ischemia: Secondary | ICD-10-CM | POA: Diagnosis not present

## 2015-09-23 DIAGNOSIS — E113512 Type 2 diabetes mellitus with proliferative diabetic retinopathy with macular edema, left eye: Secondary | ICD-10-CM | POA: Diagnosis not present

## 2015-10-01 ENCOUNTER — Other Ambulatory Visit: Payer: Self-pay | Admitting: *Deleted

## 2015-10-01 NOTE — Telephone Encounter (Signed)
Patient also need Accu-chek Aviva Plus Test strips and Relion Pen Needles 31x6 mm.  They are not listed on medication list.  Derl Barrow, RN

## 2015-10-02 ENCOUNTER — Other Ambulatory Visit: Payer: Self-pay | Admitting: Family Medicine

## 2015-10-02 DIAGNOSIS — E11319 Type 2 diabetes mellitus with unspecified diabetic retinopathy without macular edema: Secondary | ICD-10-CM

## 2015-10-02 MED ORDER — NOVOLOG FLEXPEN 100 UNIT/ML ~~LOC~~ SOPN: 12.0000 [IU] | PEN_INJECTOR | Freq: Two times a day (BID) | SUBCUTANEOUS | Status: DC

## 2015-10-02 MED ORDER — INSULIN PEN NEEDLE 31G X 6 MM MISC: Status: DC

## 2015-10-02 MED ORDER — JANUMET 50-1000 MG PO TABS: 1.0000 | ORAL_TABLET | Freq: Two times a day (BID) | ORAL | Status: DC

## 2015-10-02 MED ORDER — SIMVASTATIN 40 MG PO TABS: 40.0000 mg | ORAL_TABLET | Freq: Every day | ORAL | Status: DC

## 2015-10-02 MED ORDER — NORTRIPTYLINE HCL 25 MG PO CAPS: 25.0000 mg | ORAL_CAPSULE | Freq: Every day | ORAL | Status: DC

## 2015-10-02 MED ORDER — LANTUS SOLOSTAR 100 UNIT/ML ~~LOC~~ SOPN: 50.0000 [IU] | PEN_INJECTOR | Freq: Every day | SUBCUTANEOUS | Status: DC

## 2015-10-02 MED ORDER — GLUCOSE BLOOD VI STRP: ORAL_STRIP | Status: DC

## 2016-03-15 DIAGNOSIS — E113593 Type 2 diabetes mellitus with proliferative diabetic retinopathy without macular edema, bilateral: Secondary | ICD-10-CM | POA: Diagnosis not present

## 2016-03-15 DIAGNOSIS — H3582 Retinal ischemia: Secondary | ICD-10-CM | POA: Diagnosis not present

## 2016-04-04 ENCOUNTER — Ambulatory Visit: Payer: Medicare Other | Admitting: Family Medicine

## 2016-05-25 ENCOUNTER — Ambulatory Visit (INDEPENDENT_AMBULATORY_CARE_PROVIDER_SITE_OTHER): Payer: Medicare Other | Admitting: Family Medicine

## 2016-05-25 ENCOUNTER — Encounter: Payer: Self-pay | Admitting: Family Medicine

## 2016-05-25 VITALS — BP 124/66 | HR 89 | Temp 97.7°F | Ht 69.0 in | Wt 142.4 lb

## 2016-05-25 DIAGNOSIS — Z1159 Encounter for screening for other viral diseases: Secondary | ICD-10-CM | POA: Diagnosis not present

## 2016-05-25 DIAGNOSIS — I10 Essential (primary) hypertension: Secondary | ICD-10-CM

## 2016-05-25 DIAGNOSIS — E1169 Type 2 diabetes mellitus with other specified complication: Secondary | ICD-10-CM | POA: Diagnosis not present

## 2016-05-25 DIAGNOSIS — E785 Hyperlipidemia, unspecified: Secondary | ICD-10-CM | POA: Diagnosis not present

## 2016-05-25 DIAGNOSIS — Z125 Encounter for screening for malignant neoplasm of prostate: Secondary | ICD-10-CM

## 2016-05-25 DIAGNOSIS — Z114 Encounter for screening for human immunodeficiency virus [HIV]: Secondary | ICD-10-CM

## 2016-05-25 DIAGNOSIS — E11319 Type 2 diabetes mellitus with unspecified diabetic retinopathy without macular edema: Secondary | ICD-10-CM | POA: Diagnosis present

## 2016-05-25 DIAGNOSIS — R9431 Abnormal electrocardiogram [ECG] [EKG]: Secondary | ICD-10-CM | POA: Diagnosis not present

## 2016-05-25 LAB — POCT GLYCOSYLATED HEMOGLOBIN (HGB A1C): Hemoglobin A1C: 12.8

## 2016-05-25 NOTE — Patient Instructions (Addendum)
I have placed orders to check your cholesterol, prostate, kidney function, liver function, electrolytes.  Your A1c is VERY elevated today.  Please make sure that you schedule an appointment with Dr Loanne Drilling (endocrinology) when you leave today.  Schedule an appt with me to be seen for your full physical exam.  Dr Cordelia Pen information:  Address: [address], [place], [place] [address]  Phone: [phone]

## 2016-05-25 NOTE — Progress Notes (Signed)
Subjective: CC: HTN, DM2 HPI: Alec York is a 65 y.o. male presenting to clinic today for office visit. Concerns today include:  1. Hypertension Blood pressure at home: 120/70-80s Blood pressure today: 124/66 Meds: Compliant with Lisinopril Side effects: none ROS: Denies headache, dizziness, visual changes, nausea, vomiting, chest pain, abdominal pain or shortness of breath.  2. Diabetes:  Has not been checking BG since before going to [place].  Notes that has been eating typical [place] cuisine consisting of rice, beans, breads.  No sugary beverages.   Taking medications: Amaryl, Janumet, Lantus, Novolog (notes that he occ misses Novolog).  Not missing doses otherwise. ROS: denies fever, chills, dizziness, LOC, polyuria, polydipsia, numbness or tingling in extremities or chest pain. Last eye exam: 02/2016 Last foot exam: 07/2015 Last A1c: > 10 (07/2015) Nephropathy screen indicated?: on ACE-I  3. Prostate screen Patient notes that he has not had issues with prostate in past.  No family history of prostate cancer.  Denies dysuria, hesitancy, frequency, hematuria, weight loss, fevers, chills.  Notes occ nocturia if DM not controlled (if he has not taken his medications as directed).  4. Health screening Patient notes that he has never been checked for HIV, Hep C.  No known exposure.  Sexually active only with wife.  No h/o IV drug use or blood transfusions.  No unplanned weight loss.  Would like to have checked.  Social History Reviewed: non smoker. FamHx and MedHx reviewed.  Please see EMR. Health Maintenance: HIV, Hep C screen.  ROS: Per HPI  Objective: Office vital signs reviewed. BP 124/66 mmHg  Pulse 89  Temp(Src) 97.7 F (36.5 C) (Oral)  Ht 5\' 9"  (1.753 m)  Wt 142 lb 6.4 oz (64.592 kg)  BMI 21.02 kg/m2  SpO2 100%  Physical Examination:  General: Awake, alert, well nourished, No acute distress HEENT: Normal, MMM Cardio: regular rate and rhythm, S1S2 heard,  no murmurs appreciated Pulm: clear to auscultation bilaterally, no wheezes, rhonchi or rales, normal WOB on room air Ext: bilateral feet without ulceration or callous formation Skin: dry, intact, no rashes or lesions  Results for orders placed or performed in visit on 05/25/16 (from the past 24 hour(s))  POCT A1C     Status: Abnormal   Collection Time: 05/25/16  3:07 PM  Result Value Ref Range   Hemoglobin A1C 12.8    Assessment/ Plan: 65 y.o. male   1. Type 2 diabetes mellitus with retinopathy, macular edema presence unspecified, unspecified laterality, unspecified long term insulin use status, unspecified retinopathy severity ([place]), uncontrolled.   - reduction of carbohydrates encouraged - Patient to schedule f/u with Dr Loanne Drilling, endocrinology.  Needs medication modification/ continued dietary counseling - Will discuss referral to Dr Jenne Campus at next appt - POCT A1C - Lipid panel; Future - COMPLETE METABOLIC PANEL WITH GFR; Future  2. Essential hypertension, controlled. - Continue current antihypertensives - Lipid panel; Future - COMPLETE METABOLIC PANEL WITH GFR; Future  3. Hyperlipidemia associated with type 2 diabetes mellitus ([place]) - Lipid panel; Future - COMPLETE METABOLIC PANEL WITH GFR; Future  4. Screening for HIV (human immunodeficiency virus) - HIV antibody; Future  5. Need for hepatitis C screening test - Hepatitis C antibody; Future  6. Screening for prostate cancer.  Per patient request, PSA ordered. - PSA, Medicare; Future  7. T wave inversion in EKG. Unfortunately, not obtained before patient left office.  Asymptomatic.  Repeat EKG recommended by endocrinology for t wave inversions appreciated on office EKG. - Will plan  to obtain repeat at next visit and consider referral to Cardiology. - EKG 12-Lead   Fasting labs ordered.  Patient to schedule full physical exam within next few weeks.  Janora Norlander, DO PGY-3, [facility]

## 2016-05-31 ENCOUNTER — Other Ambulatory Visit: Payer: Medicare Other

## 2016-05-31 DIAGNOSIS — Z1159 Encounter for screening for other viral diseases: Secondary | ICD-10-CM | POA: Diagnosis not present

## 2016-05-31 DIAGNOSIS — Z125 Encounter for screening for malignant neoplasm of prostate: Secondary | ICD-10-CM

## 2016-05-31 DIAGNOSIS — E1169 Type 2 diabetes mellitus with other specified complication: Secondary | ICD-10-CM

## 2016-05-31 DIAGNOSIS — Z114 Encounter for screening for human immunodeficiency virus [HIV]: Secondary | ICD-10-CM | POA: Diagnosis not present

## 2016-05-31 DIAGNOSIS — I10 Essential (primary) hypertension: Secondary | ICD-10-CM | POA: Diagnosis not present

## 2016-05-31 DIAGNOSIS — E11319 Type 2 diabetes mellitus with unspecified diabetic retinopathy without macular edema: Secondary | ICD-10-CM

## 2016-05-31 DIAGNOSIS — E785 Hyperlipidemia, unspecified: Secondary | ICD-10-CM

## 2016-06-01 LAB — LIPID PANEL
CHOL/HDL RATIO: 2.9 ratio (ref <=5.0)
CHOLESTEROL: 85 mg/dL — AB (ref 125–200)
HDL: 29 mg/dL — AB (ref >=40)
LDL Cholesterol: 24 mg/dL (ref <130)
Triglycerides: 162 mg/dL — ABNORMAL HIGH (ref <150)
VLDL: 32 mg/dL — ABNORMAL HIGH (ref <30)

## 2016-06-01 LAB — COMPLETE METABOLIC PANEL WITH GFR
ALBUMIN: 4.2 g/dL (ref 3.6–5.1)
ALK PHOS: 100 U/L (ref 40–115)
ALT: 19 U/L (ref 9–46)
AST: 19 U/L (ref 10–35)
BUN: 16 mg/dL (ref 7–25)
CALCIUM: 9.4 mg/dL (ref 8.6–10.3)
CO2: 22 mmol/L (ref 20–31)
Chloride: 99 mmol/L (ref 98–110)
Creat: 0.98 mg/dL (ref 0.70–1.25)
GFR, Est African American: >89 mL/min (ref >=60)
GFR, Est Non African American: 82 mL/min (ref >=60)
Glucose, Bld: 267 mg/dL — ABNORMAL HIGH (ref 65–99)
POTASSIUM: 4.6 mmol/L (ref 3.5–5.3)
Sodium: 134 mmol/L — ABNORMAL LOW (ref 135–146)
Total Bilirubin: 0.3 mg/dL (ref 0.2–1.2)
Total Protein: 7.2 g/dL (ref 6.1–8.1)

## 2016-06-01 LAB — HIV ANTIBODY (ROUTINE TESTING W REFLEX): HIV 1&2 Ab, 4th Generation: NONREACTIVE

## 2016-06-01 LAB — PSA, MEDICARE: PSA: 0.17 ng/mL (ref <=4.00)

## 2016-06-01 LAB — HEPATITIS C ANTIBODY: HCV AB: NEGATIVE

## 2016-06-02 ENCOUNTER — Encounter: Payer: Self-pay | Admitting: Family Medicine

## 2016-10-10 ENCOUNTER — Other Ambulatory Visit: Payer: Self-pay | Admitting: Family Medicine

## 2016-10-10 NOTE — Telephone Encounter (Signed)
Refill x2 months.  Please call patient to make sure that they have scheduled with Dr Arnoldo Lenis (endocrinologist) for more refills.

## 2016-10-17 NOTE — Telephone Encounter (Signed)
Contacted pt to give him the below message and he said that he is not going to that doctor because they charge him too much money and he doesn't have money.  Said that he is only going to see Dr. Lajuana Ripple.  Will route to PCP as an FYI. Katharina Caper, April D, [place]

## 2016-10-28 ENCOUNTER — Other Ambulatory Visit: Payer: Self-pay | Admitting: Family Medicine

## 2016-12-13 DIAGNOSIS — E113591 Type 2 diabetes mellitus with proliferative diabetic retinopathy without macular edema, right eye: Secondary | ICD-10-CM | POA: Diagnosis not present

## 2016-12-13 DIAGNOSIS — H43812 Vitreous degeneration, left eye: Secondary | ICD-10-CM | POA: Diagnosis not present

## 2016-12-13 DIAGNOSIS — E113512 Type 2 diabetes mellitus with proliferative diabetic retinopathy with macular edema, left eye: Secondary | ICD-10-CM | POA: Diagnosis not present

## 2016-12-13 LAB — HM DIABETES EYE EXAM

## 2016-12-22 ENCOUNTER — Ambulatory Visit (INDEPENDENT_AMBULATORY_CARE_PROVIDER_SITE_OTHER): Payer: Medicare Other | Admitting: Family Medicine

## 2016-12-22 VITALS — BP 110/56 | HR 98 | Temp 98.4°F | Ht 69.0 in | Wt 148.4 lb

## 2016-12-22 DIAGNOSIS — Z23 Encounter for immunization: Secondary | ICD-10-CM

## 2016-12-22 DIAGNOSIS — E11319 Type 2 diabetes mellitus with unspecified diabetic retinopathy without macular edema: Secondary | ICD-10-CM | POA: Diagnosis not present

## 2016-12-22 DIAGNOSIS — R350 Frequency of micturition: Secondary | ICD-10-CM

## 2016-12-22 DIAGNOSIS — I10 Essential (primary) hypertension: Secondary | ICD-10-CM | POA: Diagnosis not present

## 2016-12-22 LAB — COMPLETE METABOLIC PANEL WITH GFR
ALBUMIN: 4.2 g/dL (ref 3.6–5.1)
ALK PHOS: 86 U/L (ref 40–115)
ALT: 21 U/L (ref 9–46)
AST: 23 U/L (ref 10–35)
BILIRUBIN TOTAL: 0.4 mg/dL (ref 0.2–1.2)
BUN: 13 mg/dL (ref 7–25)
CO2: 22 mmol/L (ref 20–31)
CREATININE: 1.21 mg/dL (ref 0.70–1.25)
Calcium: 9.3 mg/dL (ref 8.6–10.3)
Chloride: 99 mmol/L (ref 98–110)
GFR, Est African American: 73 mL/min (ref >=60)
GFR, Est Non African American: 63 mL/min (ref >=60)
GLUCOSE: 251 mg/dL — AB (ref 65–99)
Potassium: 5.3 mmol/L (ref 3.5–5.3)
SODIUM: 133 mmol/L — AB (ref 135–146)
TOTAL PROTEIN: 7.4 g/dL (ref 6.1–8.1)

## 2016-12-22 LAB — PSA: PSA: 0.2 ng/mL (ref <=4.0)

## 2016-12-22 LAB — POCT GLYCOSYLATED HEMOGLOBIN (HGB A1C): HEMOGLOBIN A1C: 12.6

## 2016-12-22 MED ORDER — ZOSTER VACCINE LIVE 19400 UNT/0.65ML ~~LOC~~ SUSR: 0.6500 mL | Freq: Once | SUBCUTANEOUS | 0 refills | Status: AC

## 2016-12-22 MED ORDER — ZOSTER VACCINE LIVE 19400 UNT/0.65ML ~~LOC~~ SUSR: 0.6500 mL | Freq: Once | SUBCUTANEOUS | 0 refills | Status: DC

## 2016-12-22 NOTE — Patient Instructions (Signed)
I recommend that you schedule an appointment with Dr Valentina Lucks (he is our pharmacist here) to help you tighten control of your blood sugar.  Your A1c continues to be elevated.  Monitor you blood sugars daily.  I have enclosed a blood sugar log.  I recommend that you have your Diabetic eye exam done.  Schedule your physical exam in the next 3 months.

## 2016-12-22 NOTE — Assessment & Plan Note (Signed)
BP controlled, if anything on the low side of normal.  Will consider reducing ACE-I.  CMP ordered.

## 2016-12-22 NOTE — Assessment & Plan Note (Addendum)
A1c 12.6 today.  He is noncompliant with Novolog and BG testing.  I am reluctant to titrate insulin when he is not taking it.  I have recommended that he follow up with Dr Valentina Lucks in the next couple of weeks.  He seems concerned about developing gangrene/ amputations but this does not motivate him to take his insulin.  Perhaps he would also benefit from [place] visit at the Pharmacy visit with Dr Valentina Lucks.  DM foot exam performed.  Rx written for Accucheck meter/ strips/ lancets today x12 months.  Test QID.  CMP.  Will obtain DM eye exam from [facility].  BG log given to patient to bring to next visit.

## 2016-12-22 NOTE — Addendum Note (Signed)
Addended by: Katharina Caper, APRIL D on: 12/22/2016 12:33 PM   Modules accepted: Orders, SmartSet

## 2016-12-22 NOTE — Progress Notes (Signed)
Subjective: CC: DM HPI: Alec York is a 66 y.o. male presenting to clinic today for:  1. Diabetes:  Patient last seen 05/25/16 for DM.  At that time his A1c was 12.8.  He was taking Amaryl, Janumet, Lantus and inconsistently Novolog.  Today, he reports that he has not taken Novolog in over a month.  He notes that he has also not been checking his BG in >1 month.  Reports compliance with ACE-I, ASA, and statin. ROS: denies fever, chills, dizziness, LOC, chest pain, numbness or tingling in extremities.  Endorses polyuria, polydipsia.  Last eye exam: last week, [facility] Last foot exam: >6 months ago Nephropathy screen indicated?: NO. on ACE-I, which he reports compliance Last flu, zoster and/or pneumovax: Due for PNA, Zostavax and TDap  2.Hypertension Meds: Compliant with Lisinopril 40mg  daily ROS: Denies headache, dizziness, visual changes, nausea, vomiting, chest pain, abdominal pain or shortness of breath.  Social Hx reviewed: non smoker. MedHx, medications and allergies reviewed.  Please see EMR. Health Maintenance: Flu, Shingles, PNA, TDap ROS: Per HPI  Objective: Office vital signs reviewed. BP (!) 110/56   Pulse 98   Temp 98.4 F (36.9 C) (Oral)   Ht 5\' 9"  (1.753 m)   Wt 148 lb 6.4 oz (67.3 kg)   SpO2 98%   BMI 21.91 kg/m   Physical Examination:  General: Awake, alert, well nourished, well appearing male, accompanied to visit by wife, No acute distress HEENT: Normal    Eyes: wears glasses, sclera white    Throat: moist mucus membranes Cardio: regular rate and rhythm, S1S2 heard, no murmurs appreciated Pulm: clear to auscultation bilaterally, no wheezes, rhonchi or rales; normal work of breathing on room air Ext: cool, +1posterior tib pulses  Diabetic Foot Form - Detailed   Diabetic Foot Exam - detailed Diabetic Foot exam was performed with the following findings:  Yes 12/22/2016  9:46 AM  Visual Foot Exam completed.:  Yes  Is there a history of foot  ulcer?:  No Can the patient see the bottom of their feet?:  Yes Are the shoes appropriate in style and fit?:  Yes Is there swelling or and abnormal foot shape?:  No Are the toenails long?:  No Are the toenails thick?:  Yes Do you have pain in calf while walking?:  No Is there a claw toe deformity?:  No Is there elevated skin temparature?:  No Is there limited skin dorsiflexion?:  No Is there foot or ankle muscle weakness?:  No Are the toenails ingrown?:  No Normal Range of Motion:  Yes Pulse Foot Exam completed.:  Yes  Right posterior Tibialias:  Present Left posterior Tibialias:  Present  Right Dorsalis Pedis:  Present Left Dorsalis Pedis:  Present  Sensory Foot Exam Completed.:  Yes Swelling:  No Semmes-Weinstein Monofilament Test R Foot Test Control:  Pos L Foot Test Control:  Pos  R Site 1-Great Toe:  Pos L Site 1-Great Toe:  Pos  R Site 4:  Pos L Site 4:  Pos  R Site 5:  Pos L Site 5:  Pos    Comments:  Decreased monofilament on the dorsal aspect of toes 3-5 bilaterally.     Results for orders placed or performed in visit on 12/22/16 (from the past 24 hour(s))  HgB A1c     Status: Abnormal   Collection Time: 12/22/16  9:24 AM  Result Value Ref Range   Hemoglobin A1C 12.6    Assessment/ Plan: 66 y.o. male  Type 2 diabetes mellitus with diabetic retinopathy A1c 12.6 today.  He is noncompliant with Novolog and BG testing.  I am reluctant to titrate insulin when he is not taking it.  I have recommended that he follow up with Dr Valentina Lucks in the next couple of weeks.  He seems concerned about developing gangrene/ amputations but this does not motivate him to take his insulin.  Perhaps he would also benefit from [place] visit at the Pharmacy visit with Dr Valentina Lucks.  DM foot exam performed.  Rx written for Accucheck meter/ strips/ lancets today x12 months.  Test QID.  CMP.  Will obtain DM eye exam from [facility].  BG log given to patient to bring to next  visit.  Essential hypertension BP controlled, if anything on the low side of normal.  Will consider reducing ACE-I.  CMP ordered.  Urinary frequency.  Likely related to uncontrolled BG.  However, patient would like PSA test done, so will complete this.  Last PSA 05/2016 was 0.17. - COMPLETE METABOLIC PANEL WITH GFR - PSA  Need for shingles vaccine. Rx provided - Zoster Vaccine Live, PF, (ZOSTAVAX) 29562 UNT/0.65ML injection; Inject 19,400 Units into the skin once.  Dispense: 1 each; Refill: 0  Encounter for immunization - Flu Vaccine QUAD 36+ mos IM administered today  Need for Tdap vaccination - Rx for Tdap provided.  Follow up in 3 months for A1c, Annual exam.  Follow up in next 2 weeks with Dr Valentina Lucks. Janora Norlander, DO PGY-3, [place] [facility]

## 2016-12-23 ENCOUNTER — Telehealth: Payer: Self-pay | Admitting: Family Medicine

## 2016-12-23 ENCOUNTER — Other Ambulatory Visit: Payer: Self-pay | Admitting: Family Medicine

## 2016-12-23 ENCOUNTER — Encounter: Payer: Self-pay | Admitting: Family Medicine

## 2016-12-23 DIAGNOSIS — E11319 Type 2 diabetes mellitus with unspecified diabetic retinopathy without macular edema: Secondary | ICD-10-CM

## 2016-12-23 MED ORDER — ACCU-CHEK SOFTCLIX LANCET DEV MISC: 3 refills | Status: DC

## 2016-12-23 MED ORDER — BLOOD GLUCOSE MONITOR KIT: PACK | 0 refills | Status: DC

## 2016-12-23 MED ORDER — GLUCOSE BLOOD VI STRP: ORAL_STRIP | 3 refills | Status: DC

## 2016-12-23 NOTE — Telephone Encounter (Signed)
rx needs to include icd 10 code

## 2016-12-23 NOTE — Telephone Encounter (Signed)
Signed by Dr Ree Kida.  Rx's placed in fax pile to be sent to [facility] pharmacy [phone]

## 2016-12-23 NOTE — Telephone Encounter (Signed)
Needs another prescription for accucheck avia plus and test strips, soft click lancets.  Pt wants 3 month supply and 3 refills.  Needs to be signed by faculty rather than resident.  [facility]

## 2016-12-26 ENCOUNTER — Telehealth: Payer: Self-pay | Admitting: Family Medicine

## 2016-12-26 NOTE — Telephone Encounter (Signed)
-----   Message from Janora Norlander, DO sent at 12/22/2016 11:27 AM EST ----- Regarding: eye exam Please obtain DM eye exam from [place]  Thanks!

## 2016-12-26 NOTE — Telephone Encounter (Signed)
I have requested a DM eye exam report from [facility] . Their office will fax the report to our office.    [place]

## 2017-01-12 ENCOUNTER — Ambulatory Visit: Payer: Medicare Other | Admitting: Pharmacist

## 2017-04-28 ENCOUNTER — Other Ambulatory Visit: Payer: Self-pay | Admitting: *Deleted

## 2017-04-28 MED ORDER — LANTUS SOLOSTAR 100 UNIT/ML ~~LOC~~ SOPN: PEN_INJECTOR | SUBCUTANEOUS | 0 refills | Status: DC

## 2017-05-08 ENCOUNTER — Telehealth: Payer: Self-pay | Admitting: *Deleted

## 2017-05-08 NOTE — Telephone Encounter (Signed)
Prior Authorization received from [employer] for [employer]. Formulary preferred by insurance: [place]mir, Engineer, agricultural or [place].  Please change to one of the preferred medications.   Derl Barrow, RN

## 2017-05-09 ENCOUNTER — Other Ambulatory Visit: Payer: Self-pay | Admitting: Family Medicine

## 2017-05-09 MED ORDER — BASAGLAR KWIKPEN 100 UNIT/ML ~~LOC~~ SOPN: 50.0000 [IU] | PEN_INJECTOR | Freq: Every day | SUBCUTANEOUS | 0 refills | Status: DC

## 2017-05-09 NOTE — Telephone Encounter (Signed)
Prescription for Basaglar sent in. Dose is still 50u qhs.  Please let patient know of change and have him schedule a f/u to meet new PCP in 1 month for DM2/ continued refills.

## 2017-05-09 NOTE — Telephone Encounter (Signed)
Patient aware that insulin was changed to Basaglar 50 Units and to schedule appointment with new PCP.  Derl Barrow, RN

## 2017-05-09 NOTE — Telephone Encounter (Signed)
Another PA request for Lantus SoloStar.  Please see previous message.  Derl Barrow, RN

## 2017-05-09 NOTE — Progress Notes (Signed)
Insurance does not cover lantus.   Replaced with basaglar./

## 2017-05-25 ENCOUNTER — Ambulatory Visit: Payer: Medicare Other | Admitting: Family Medicine

## 2017-06-15 ENCOUNTER — Encounter: Payer: Self-pay | Admitting: Student

## 2017-06-15 ENCOUNTER — Ambulatory Visit (INDEPENDENT_AMBULATORY_CARE_PROVIDER_SITE_OTHER): Payer: Medicare Other | Admitting: Student

## 2017-06-15 VITALS — BP 90/50 | HR 69 | Temp 98.1°F | Ht 69.0 in | Wt 145.8 lb

## 2017-06-15 DIAGNOSIS — Z23 Encounter for immunization: Secondary | ICD-10-CM | POA: Diagnosis not present

## 2017-06-15 DIAGNOSIS — M25561 Pain in right knee: Secondary | ICD-10-CM | POA: Diagnosis not present

## 2017-06-15 DIAGNOSIS — M25562 Pain in left knee: Secondary | ICD-10-CM | POA: Diagnosis not present

## 2017-06-15 DIAGNOSIS — I10 Essential (primary) hypertension: Secondary | ICD-10-CM

## 2017-06-15 DIAGNOSIS — M25569 Pain in unspecified knee: Secondary | ICD-10-CM

## 2017-06-15 DIAGNOSIS — E11319 Type 2 diabetes mellitus with unspecified diabetic retinopathy without macular edema: Secondary | ICD-10-CM | POA: Diagnosis present

## 2017-06-15 DIAGNOSIS — G8929 Other chronic pain: Secondary | ICD-10-CM

## 2017-06-15 LAB — POCT GLYCOSYLATED HEMOGLOBIN (HGB A1C): Hemoglobin A1C: >15

## 2017-06-15 MED ORDER — NOVOLOG FLEXPEN 100 UNIT/ML ~~LOC~~ SOPN: 10.0000 [IU] | PEN_INJECTOR | Freq: Three times a day (TID) | SUBCUTANEOUS | 11 refills | Status: DC

## 2017-06-15 MED ORDER — LIRAGLUTIDE 18 MG/3ML ~~LOC~~ SOPN: PEN_INJECTOR | SUBCUTANEOUS | 3 refills | Status: DC

## 2017-06-15 MED ORDER — METFORMIN HCL 1000 MG PO TABS: 1000.0000 mg | ORAL_TABLET | Freq: Two times a day (BID) | ORAL | 3 refills | Status: DC

## 2017-06-15 MED ORDER — TETANUS-DIPHTH-ACELL PERTUSSIS 5-2.5-18.5 LF-MCG/0.5 IM SUSP: 0.5000 mL | Freq: Once | INTRAMUSCULAR | 0 refills | Status: DC

## 2017-06-15 MED ORDER — GLUCAGON (RDNA) 1 MG IJ KIT: 1.0000 mg | PACK | Freq: Once | INTRAMUSCULAR | 12 refills | Status: DC | PRN

## 2017-06-15 NOTE — Patient Instructions (Addendum)
It was great seeing you today! We have addressed the following issues today Diabetes: your A1c is greater than 15% today. It was 12.6% about 6 months ago. Your goal A1c is less than 8%. See below for more information about A1c.  1. Check your blood glucose 4 times a day (15 minutes before each main meal and about 10 pm) 2-3 days a week and write down the numbers on the book we gave you 2. Inject 50 units of Basaglar before bedtime (at 10 PM) 3. Inject 10 units of NovoLog 3 times a day (15 minutes before each meal) 4. We will stopped the Janumet and glimepiride 5. We started metformin 1000 mg twice a day 6. We started Victoza injection once a day. Follow the directions on the prescription 7. Eat regular meals (breakfast, lunch and dinner) around-the-clock. You may snack as needed. See below about few tips and recommendations on diet and exercise.  8. Watch for symptoms of low blood sugar (hypoglycemia). Read below about these symptoms and management. 9. Please come back and see [place] in two weeks.  10. Please bring all your medication bottles to that visit.   What is A1c:  The A1C test result reflects your average blood sugar level for the past two to three months. Specifically, the A1C test measures what percentage of your hemoglobin - a protein in red blood cells that carries oxygen - is coated with sugar (glycated). The higher your A1C level, the poorer your blood sugar control and the higher your risk of diabetes complications. Portion Size    Choose healthier foods such as 100% whole grains, vegetables, fruits, beans, nut seeds, olive oil, most vegetable oils, fat-free dietary, wild game and fish.   Avoid sweet tea, other sweetened beverages, soda, fruit juice, cold cereal and milk and trans fat.   Eat at least 3 meals and 1-2 snacks per day.  Aim for no more than 5 hours between eating.  Eat breakfast within one hour of getting up.    Exercise at least 150 minutes per week, including  weight resistance exercises 3 or 4 times per week.   Try to lose at least 7-10% of your current body weight.   Hypoglycemia Hypoglycemia is when the sugar (glucose) level in the blood is too low. Symptoms of low blood sugar may include:  Feeling: ? Hungry. ? Worried or nervous (anxious). ? Sweaty and clammy. ? Confused. ? Dizzy. ? Sleepy. ? Sick to your stomach (nauseous).  Having: ? A fast heartbeat. ? A headache. ? A change in your vision. ? Jerky movements that you cannot control (seizure). ? Nightmares. ? Tingling or no feeling (numbness) around the mouth, lips, or tongue.  Having trouble with: ? Talking. ? Paying attention (concentrating). ? Moving (coordination). ? Sleeping.  Shaking.  Passing out (fainting).  Getting upset easily (irritability).  Low blood sugar can happen to people who have diabetes and people who do not have diabetes. Low blood sugar can happen quickly, and it can be an emergency. Treating Low Blood Sugar Low blood sugar is often treated by eating or drinking something sugary right away. If you can think clearly and swallow safely, follow the 15:15 rule:  Take 15 grams of a fast-acting carb (carbohydrate). Some fast-acting carbs are: ? 1 tube of glucose gel. ? 3 sugar tablets (glucose pills). ? 6-8 pieces of hard candy. ? 4 oz (120 mL) of fruit juice. ? 4 oz (120 mL) of regular (not diet) soda.  Check your  blood sugar 15 minutes after you take the carb.  If your blood sugar is still at or below 70 mg/dL (3.9 mmol/L), take 15 grams of a carb again.  If your blood sugar does not go above 70 mg/dL (3.9 mmol/L) after 3 tries, get help right away.  After your blood sugar goes back to normal, eat a meal or a snack within 1 hour.  Treating Very Low Blood Sugar If your blood sugar is at or below 54 mg/dL (3 mmol/L), you have very low blood sugar (severe hypoglycemia). This is an emergency. Do not wait to see if the symptoms will go away. Get  medical help right away. Call your local emergency services (911 in the [place] Do not drive yourself to the hospital. If you have very low blood sugar and you cannot eat or drink, you may need a glucagon shot (injection). A family member or friend should learn how to check your blood sugar and how to give you a glucagon shot. Ask your doctor if you need to have a glucagon shot kit at home. Follow these instructions at home: General instructions  Avoid any diets that cause you to not eat enough food. Talk with your doctor before you start any new diet.  Take over-the-counter and prescription medicines only as told by your doctor.  Limit alcohol to no more than 1 drink per day for nonpregnant women and 2 drinks per day for men. One drink equals 12 oz of beer, 5 oz of wine, or 1 oz of hard liquor.  Keep all follow-up visits as told by your doctor. This is important. If You Have Diabetes:   Make sure you know the symptoms of low blood sugar.  Always keep a source of sugar with you, such as: ? Sugar. ? Sugar tablets. ? Glucose gel. ? Fruit juice. ? Regular soda (not diet soda). ? Milk. ? Hard candy. ? Honey.  Take your medicines as told.  Follow your exercise and meal plan. ? Eat on time. Do not skip meals. ? Follow your sick day plan when you cannot eat or drink normally. Make this plan ahead of time with your doctor.  Check your blood sugar as often as told by your doctor. Always check before and after exercise.  Share your diabetes care plan with: ? Your work or school. ? People you live with.  Check your pee (urine) for ketones: ? When you are sick. ? As told by your doctor.  Carry a card or wear jewelry that says you have diabetes. If You Have Low Blood Sugar From Other Causes:   Check your blood sugar as often as told by your doctor.  Follow instructions from your doctor about what you cannot eat or drink. Contact a doctor if:  You have trouble keeping your blood  sugar in your target range.  You have low blood sugar often. Get help right away if:  You still have symptoms after you eat or drink something sugary.  Your blood sugar is at or below 54 mg/dL (3 mmol/L).  You have jerky movements that you cannot control.  You pass out. These symptoms may be an emergency. Do not wait to see if the symptoms will go away. Get medical help right away. Call your local emergency services (911 in the [place] Do not drive yourself to the hospital. This information is not intended to replace advice given to you by your health care provider. Make sure you discuss any questions you have  with your health care provider. Document Released: 02/01/2010 Document Revised: 04/14/2016 Document Reviewed: 12/11/2015 Elsevier Interactive Patient Education  [employer].

## 2017-06-15 NOTE — Assessment & Plan Note (Addendum)
Poorly controlled partly due to poor compliance with his medications. A1c > 15% today. There is also an element of poor healthy literacy. Discussed about his A1c and the impact of poorly controlled diabetes on his health. Patient voices understanding this. Plan:  1. Check your blood glucose 4 times a day (15 minutes before each main meal and about 10 pm) 2-3 days a week and write down the numbers on the book we gave you.  2. Inject 50 units of Basaglar before bedtime (at 10 PM) 3. Inject 10 units of NovoLog 3 times a day (15 minutes before each meal) 4. We stopped Janumet and glimepiride. The later will increase his risk of hypoglycemia with insulin. 5. We started metformin 1000 mg twice a day 6. We started Victoza injection once a day. Follow the directions on the prescription. 7. Eat regular meals (breakfast, lunch and dinner) around-the-clock. You may snack as needed. Gave him handout on diet, portion size and exercise.  8. Watch for symptoms of low blood sugar (hypoglycemia). Gave handout about symptoms and management of hypoglycemia. Gave Rx for glucagon. 9. Please come back and see [place] in two weeks.  10. Follow up in two weeks.

## 2017-06-15 NOTE — Progress Notes (Signed)
Subjective:    Alec York is a 66 y.o. old male here for follow up on diabetes Hindi interpretor with ID #: [id number] was used for this encounter  HPI Diabetes Mellitus Patient presents for follow up of diabetes. Current symptoms include: hyperglycemia, increase appetite, polydipsia, polyuria and visual disturbances. Symptoms have gradually worsened. Patient denies foot ulcerations, hypoglycemia , nausea, vomiting, weight loss and or fever. Evaluation to date has included: hemoglobin A1C which was elevated to >15% today. His previous A1c was 12.6%.  Home fasting sugars: BGs range between 180 and 220. He checks his BG in the morning two to three times a week. Patient is supposed to be on basaglar 50 units at bedtime, NovoLog 15 units twice a day with meals, Janumet and glimipride. However, he injects 40 units of basaglar before bedtime and 15 units of Novolog once a day before dinner. He hasn't been to a dentist in his life due to insurance issue. Doesn't exercise. Wife cooks at home. He reports eating some vegetables.  Denies drinking juice or soda.   PMH/Problem List: has Acid reflux; Type 2 diabetes mellitus with diabetic retinopathy ([place]); Essential hypertension; Hyperlipidemia associated with type 2 diabetes mellitus ([place]); Bursitis of right shoulder; Allergic rhinitis; Adhesive capsulitis of right shoulder; Nonspecific abnormal electrocardiogram (ECG) (EKG); and Knee pain, chronic on his problem list.   has a past medical history of Allergy; Diabetes mellitus without complication ([place]); Diabetic retinopathy ([place]); GERD (gastroesophageal reflux disease); Hyperlipidemia; and Hypertension.  FH:  Family History  Problem Relation Age of Onset  . Diabetes Neg Hx     SH Social History  Substance Use Topics  . Smoking status: Former Research scientist (life sciences)  . Smokeless tobacco: Never Used  . Alcohol use No    Review of Systems Review of systems negative except for pertinent positives and negatives in history  of present illness above.     Objective:    Vitals:   06/15/17 0837  BP: (!) 90/50  Pulse: 69  Temp: 98.1 F (36.7 C)  TempSrc: Oral  SpO2: 99%  Weight: 145 lb 12.8 oz (66.1 kg)  Height: 5\' 9"  (1.753 m)    Physical Exam GEN: appears well, no apparent distress. HEM: negative for cervical or periauricular lymphadenopathies ENDO: negative thyromegally CVS: RRR, nl S1&S2, no murmurs, no edema. DP and PT pulses 1+ bilaterally RESP: no IWOB, good air movement bilaterally, CTAB GI: BS present & normal, soft, NTND MSK: no focal tenderness or notable swelling SKIN: no apparent skin lesion on his feet or legs. Toenails short NEURO: alert and oiented appropriately, no gross deficits  PSYCH: euthymic mood with congruent affect    Assessment and Plan:  Type 2 diabetes mellitus with diabetic retinopathy Poorly controlled partly due to poor compliance with his medications. A1c > 15% today. There is also an element of poor healthy literacy. Discussed about his A1c and the impact of poorly controlled diabetes on his health. Patient voices understanding this. Plan:  1. Check your blood glucose 4 times a day (15 minutes before each main meal and about 10 pm) 2-3 days a week and write down the numbers on the book we gave you.  2. Inject 50 units of Basaglar before bedtime (at 10 PM) 3. Inject 10 units of NovoLog 3 times a day (15 minutes before each meal) 4. We stopped Janumet and glimepiride. The later will increase his risk of hypoglycemia with insulin. 5. We started metformin 1000 mg twice a day 6. We started Victoza injection once a day.  Follow the directions on the prescription. 7. Eat regular meals (breakfast, lunch and dinner) around-the-clock. You may snack as needed. Gave him handout on diet, portion size and exercise.  8. Watch for symptoms of low blood sugar (hypoglycemia). Gave handout about symptoms and management of hypoglycemia. Gave Rx for glucagon. 9. Please come back and see [place]  in two weeks.  10. Follow up in two weeks.   Essential hypertension He is slightly hypotensive but not symptomatic.  -BMP today. We may need to back up on his medication if this is the case when he returns in two weeks  Knee pain, chronic Couldn't get time to address this today. Will discuss when he returns in two weeks  Orders Placed This Encounter  Procedures  . Basic metabolic panel  . HgB A1c   Meds ordered this encounter  Medications  . DISCONTD: Tdap (BOOSTRIX) 5-2.5-18.5 LF-MCG/0.5 injection    Sig: Inject 0.5 mLs into the muscle once.    Dispense:  0.5 mL    Refill:  0  . liraglutide 18 MG/3ML SOPN    Sig: Inject 0.6 mg once daily for 1 week; then increase to 1.2 mg once daily; if your blood glucose remains greater than 140 mg/dL, may increase further to 1.8 mg once daily.    Dispense:  9 mL    Refill:  3  . NOVOLOG FLEXPEN 100 UNIT/ML FlexPen    Sig: Inject 10 Units into the skin 3 (three) times daily with meals.    Dispense:  15 mL    Refill:  11    Please consider 90 day supplies to promote better adherence  . glucagon 1 MG injection    Sig: Inject 1 mg into the vein once as needed.    Dispense:  1 each    Refill:  12  . metFORMIN (GLUCOPHAGE) 1000 MG tablet    Sig: Take 1 tablet (1,000 mg total) by mouth 2 (two) times daily with a meal.    Dispense:  180 tablet    Refill:  3   Return in about 2 weeks (around 06/29/2017) for DM and Knee pain.  Mercy Riding, MD 06/15/17 Pager: [phone]

## 2017-06-15 NOTE — Assessment & Plan Note (Signed)
Couldn't get time to address this today. Will discuss when he returns in two weeks

## 2017-06-15 NOTE — Assessment & Plan Note (Signed)
He is slightly hypotensive but not symptomatic.  -BMP today. We may need to back up on his medication if this is the case when he returns in two weeks

## 2017-06-16 ENCOUNTER — Encounter: Payer: Self-pay | Admitting: Student

## 2017-06-16 LAB — BASIC METABOLIC PANEL
BUN / CREAT RATIO: 10 (ref 10–24)
BUN: 12 mg/dL (ref 8–27)
CHLORIDE: 93 mmol/L — AB (ref 96–106)
CO2: 23 mmol/L (ref 20–29)
Calcium: 9.3 mg/dL (ref 8.6–10.2)
Creatinine, Ser: 1.16 mg/dL (ref 0.76–1.27)
GFR calc non Af Amer: 66 mL/min/{1.73_m2} (ref >59)
GFR, EST AFRICAN AMERICAN: 77 mL/min/{1.73_m2} (ref >59)
GLUCOSE: 421 mg/dL — AB (ref 65–99)
POTASSIUM: 5.8 mmol/L — AB (ref 3.5–5.2)
SODIUM: 131 mmol/L — AB (ref 134–144)

## 2017-06-16 NOTE — Progress Notes (Signed)
BMP with glucose to 421 and hyperkalemia to 5.8. Normal bicarb and no anion gap. Called and advised patient to stop lisinopril. Patient to check his blood pressure and keep log. Advised patient to take lisinopril only if his blood pressure is more than 140/90. Patient has follow up in two weeks.  Patient voiced understanding and agrees.

## 2017-06-20 ENCOUNTER — Other Ambulatory Visit: Payer: Self-pay | Admitting: Student

## 2017-06-20 DIAGNOSIS — E11319 Type 2 diabetes mellitus with unspecified diabetic retinopathy without macular edema: Secondary | ICD-10-CM

## 2017-06-20 MED ORDER — GLUCAGON (RDNA) 1 MG IJ KIT: 1.0000 mg | PACK | Freq: Once | INTRAMUSCULAR | 12 refills | Status: DC | PRN

## 2017-07-07 ENCOUNTER — Ambulatory Visit (INDEPENDENT_AMBULATORY_CARE_PROVIDER_SITE_OTHER): Payer: Medicare Other | Admitting: Student

## 2017-07-07 ENCOUNTER — Encounter: Payer: Self-pay | Admitting: Student

## 2017-07-07 VITALS — BP 130/62 | HR 84 | Temp 98.1°F | Ht 69.0 in | Wt 147.4 lb

## 2017-07-07 DIAGNOSIS — I739 Peripheral vascular disease, unspecified: Secondary | ICD-10-CM

## 2017-07-07 DIAGNOSIS — M792 Neuralgia and neuritis, unspecified: Secondary | ICD-10-CM | POA: Diagnosis not present

## 2017-07-07 DIAGNOSIS — E11319 Type 2 diabetes mellitus with unspecified diabetic retinopathy without macular edema: Secondary | ICD-10-CM | POA: Diagnosis present

## 2017-07-07 DIAGNOSIS — E1169 Type 2 diabetes mellitus with other specified complication: Secondary | ICD-10-CM

## 2017-07-07 DIAGNOSIS — E1142 Type 2 diabetes mellitus with diabetic polyneuropathy: Secondary | ICD-10-CM | POA: Insufficient documentation

## 2017-07-07 DIAGNOSIS — Z79899 Other long term (current) drug therapy: Secondary | ICD-10-CM

## 2017-07-07 DIAGNOSIS — Z5181 Encounter for therapeutic drug level monitoring: Secondary | ICD-10-CM | POA: Diagnosis not present

## 2017-07-07 DIAGNOSIS — E785 Hyperlipidemia, unspecified: Secondary | ICD-10-CM | POA: Diagnosis not present

## 2017-07-07 DIAGNOSIS — I10 Essential (primary) hypertension: Secondary | ICD-10-CM

## 2017-07-07 MED ORDER — LIRAGLUTIDE 18 MG/3ML ~~LOC~~ SOPN: PEN_INJECTOR | SUBCUTANEOUS | 3 refills | Status: DC

## 2017-07-07 MED ORDER — GABAPENTIN 300 MG PO CAPS: 300.0000 mg | ORAL_CAPSULE | Freq: Every day | ORAL | 3 refills | Status: DC

## 2017-07-07 MED ORDER — LISINOPRIL 5 MG PO TABS: 5.0000 mg | ORAL_TABLET | Freq: Every day | ORAL | 0 refills | Status: DC

## 2017-07-07 NOTE — Assessment & Plan Note (Signed)
Well-controlled off medication. He used to be on lisinopril 40 mg daily. We discontinued this about 2 weeks ago due to hyperkalemia. Will resume at 5 mg daily for renal protection. BMP today.

## 2017-07-07 NOTE — Progress Notes (Signed)
Subjective:    Alec York is a 66 y.o. old male here for follow-up on diabetes, hypertension and leg pain Pacific interpreter was ID [id number] was used for part of this encounter. HPI Diabetes: was seen in clinic about 2 weeks ago. A1c greater than 15 at that time. We added Victoza and increased his insulin doses at that visit. However, patient has not filled the prescription yet. He reports taking his Basaglar 40-50 units nightly,  novoLog 10 units 3 times a day. And metformin 1000 mg twice a day. He brought his fasting and pre-meal blood glucose numbers to this visit, which ranges from 140-202. Fasting and pre-meal blood glucose is usually in upper 100s. Denies symptoms of hypoglycemia. Reports some numbness and tingling in his legs. Has diabetic retinopathy already. He also takes atorvastatin and aspirin. We stopped his lisinopril due to hyperkalemia.  Hypertension: discontinued his lisinopril about 2 weeks ago due to hyperkalemia. He brought his home blood pressure log to this visit. Systolic blood pressure in the range of 109-139. Diastolic blood pressure in the range of 58-80. Blood pressure from this morning 131/71 at home. Denies chest pain or dyspnea.  Leg pain: reports some tingling sensation in his legs bilaterally. This has been a chronic issue. Denies fever or swelling. Lost his left being toenail about 10 days ago when he was taking off his socks. Denies swelling, discharge, redness or fever. Patient is a former smoker. He is on aspirin and atorvastatin daily. Reports good compliance with his medications.  PMH/Problem List: has Acid reflux; Type 2 diabetes mellitus with diabetic retinopathy ([place]); Essential hypertension; Hyperlipidemia associated with type 2 diabetes mellitus ([place]); Bursitis of right shoulder; Allergic rhinitis; Adhesive capsulitis of right shoulder; Nonspecific abnormal electrocardiogram (ECG) (EKG); Knee pain, chronic; PAD (peripheral artery disease) ([place]); and  Neuropathic pain on his problem list.   has a past medical history of Allergy; Diabetes mellitus without complication ([place]); Diabetic retinopathy ([place]); GERD (gastroesophageal reflux disease); Hyperlipidemia; and Hypertension.  FH:  Family History  Problem Relation Age of Onset  . Diabetes Neg Hx     SH Social History  Substance Use Topics  . Smoking status: Former Research scientist (life sciences)  . Smokeless tobacco: Never Used  . Alcohol use No    Review of Systems Review of systems negative except for pertinent positives and negatives in history of present illness above.     Objective:     Vitals:   07/07/17 0831  BP: 130/62  Pulse: 84  Temp: 98.1 F (36.7 C)  TempSrc: Oral  SpO2: 97%  Weight: 147 lb 6.4 oz (66.9 kg)  Height: 5\' 9"  (1.753 m)    Physical Exam GEN: appears well, no apparent distress. Head: normocephalic and atraumatic  CVS: RRR, nl S1&S2, no murmurs, no edema. Very faint DP and PT pulses bilaterally. RESP: no IWOB, good air movement bilaterally, CTAB GI: BS present & normal, soft, NTND GU: no suprapubic or CVA tenderness MSK: no focal tenderness or notable swelling SKIN: Shining skins in lower extremities bilaterally. Very cold to touch bilaterally. NEURO: alert and oiented appropriately, no gross deficits  PSYCH: euthymic mood with congruent affect Diabetic Foot Exam: Inspection: no skin lesion, ulcer or callus. Toenails trimmed  Vascular: Very faint DP & PT bilaterally Neuro: 9-point monofilament exam will very poor sharp sensation  Assessment and Plan:  Type 2 diabetes mellitus with diabetic retinopathy Called the pharmacy who confirmed that patient has not filled his Victoza. I strongly encouraged him to fill his Victoza and start using  it.  1. Check your blood glucose 4 times a day (15 minutes before each main meal and about 10 pm) 2-3 days a week and write down the numbers on the book we gave you. Please let us know if your blood glucose is less than 100  consistently for 3 to 4 days 2. Inject 50 units of Basglar before bedtime (at 10 PM) 3. Inject 10 units of NovoLog 3 times a day (15 minutes before each meal) 4. Continue taking the metformin 1000 mg twice a day 5. Please fill your prescription for Victoza and start using it. Follow the direction on the prescription. 6. Eat regular meals (breakfast, lunch and dinner) around-the-clock. You may snack as needed. See below about few tips and recommendations on diet and exercise.  7. Watch for symptoms of low blood sugar (hypoglycemia). Read below about these symptoms and management. 8. We have restarted your lisinopril at 5 mg daily 9. Please come back and see [place] in 3 months 10. Please bring all your medication bottles to that visit.  We will check vitamin B12 level today.  Essential hypertension Well-controlled off medication. He used to be on lisinopril 40 mg daily. We discontinued this about 2 weeks ago due to hyperkalemia. Will resume at 5 mg daily for renal protection. BMP today.  Hyperlipidemia associated with type 2 diabetes mellitus He is on atorvastatin and baby aspirin. Continued medications. Will check lipid panel today.   PAD (peripheral artery disease) ([place]) Patient was very faint DP and PT pulses bilaterally. Both feet are cold to touch. Also with shining skins over the lower legs. He has significant smoking history. He will benefit from ABI. Recommended follow-up with Dr. Valentina Lucks in 2 weeks. He is already on atorvastatin and aspirin. I strongly recommended daily walking.  Neuropathic pain Likely due to poorly controlled diabetes. There could also be some component of PAD contributing to this. We will start gabapentin at 300 mg nightly. He may go up to 3 times a day if needed. Warned about the side effects including drowsiness and sedation.  Orders Placed This Encounter  Procedures  . Basic metabolic panel  . Lipid Panel  . Vitamin B12   Meds ordered this encounter    Medications  . liraglutide 18 MG/3ML SOPN    Sig: Inject 0.6 mg once daily for 1 week; then increase to 1.2 mg once daily; if your blood glucose remains greater than 140 mg/dL, may increase further to 1.8 mg once daily.    Dispense:  9 mL    Refill:  3  . gabapentin (NEURONTIN) 300 MG capsule    Sig: Take 1 capsule (300 mg total) by mouth at bedtime. You may take every 8 hours.    Dispense:  90 capsule    Refill:  3  . lisinopril (PRINIVIL,ZESTRIL) 5 MG tablet    Sig: Take 1 tablet (5 mg total) by mouth daily.    Dispense:  90 tablet    Refill:  0   Return in about 2 weeks (around 07/21/2017) for ABI with Dr. Valentina Lucks and Wellness visit with Mayra Reel.  Mercy Riding, MD 07/07/17 Pager: [phone]

## 2017-07-07 NOTE — Patient Instructions (Signed)
It was great seeing you today! We have addressed the following issues today Diabetes:  1. Check your blood glucose 4 times a day (15 minutes before each main meal and about 10 pm) 2-3 days a week and write down the numbers on the book we gave you. Please let us know if your blood glucose is less than 100 consistently for 3 to 4 days 2. Inject 50 units of Basglar before bedtime (at 10 PM) 3. Inject 10 units of NovoLog 3 times a day (15 minutes before each meal) 4. Continue taking the metformin 1000 mg twice a day 5. Please take the new prescription we gave you to the pharmacy to have it filled 6. Eat regular meals (breakfast, lunch and dinner) around-the-clock. You may snack as needed. See below about few tips and recommendations on diet and exercise.  7. Watch for symptoms of low blood sugar (hypoglycemia). Read below about these symptoms and management. 8. We have restarted your lisinopril at 5 mg daily 9. Please come back and see [place] in 3 months 10. Please bring all your medication bottles to that visit.  Leg pain: This is likely due to your poorly controlled diabetes. We started you on a medication called gabapentin. You can start taking this at bedtime. You may increase to 3 times a daily if needed. This medicine may make you sleepy and drowsy. I also recommend follow-up with our pharmacist to check circulation in your legs. Meanwhile, I strongly recommend daily walking.   What is A1c:  The A1C test result reflects your average blood sugar level for the past two to three months. Specifically, the A1C test measures what percentage of your hemoglobin - a protein in red blood cells that carries oxygen - is coated with sugar (glycated). The higher your A1C level, the poorer your blood sugar control and the higher your risk of diabetes complications. Portion Size    Choose healthier foods such as 100% whole grains, vegetables, fruits, beans, nut seeds, olive oil, most vegetable oils, fat-free  dietary, wild game and fish.   Avoid sweet tea, other sweetened beverages, soda, fruit juice, cold cereal and milk and trans fat.   Eat at least 3 meals and 1-2 snacks per day.  Aim for no more than 5 hours between eating.  Eat breakfast within one hour of getting up.    Exercise at least 150 minutes per week, including weight resistance exercises 3 or 4 times per week.   Try to lose at least 7-10% of your current body weight.   Hypoglycemia Hypoglycemia is when the sugar (glucose) level in the blood is too low. Symptoms of low blood sugar may include:  Feeling: ? Hungry. ? Worried or nervous (anxious). ? Sweaty and clammy. ? Confused. ? Dizzy. ? Sleepy. ? Sick to your stomach (nauseous).  Having: ? A fast heartbeat. ? A headache. ? A change in your vision. ? Jerky movements that you cannot control (seizure). ? Nightmares. ? Tingling or no feeling (numbness) around the mouth, lips, or tongue.  Having trouble with: ? Talking. ? Paying attention (concentrating). ? Moving (coordination). ? Sleeping.  Shaking.  Passing out (fainting).  Getting upset easily (irritability).  Low blood sugar can happen to people who have diabetes and people who do not have diabetes. Low blood sugar can happen quickly, and it can be an emergency. Treating Low Blood Sugar Low blood sugar is often treated by eating or drinking something sugary right away. If you can think clearly and  swallow safely, follow the 15:15 rule:  Take 15 grams of a fast-acting carb (carbohydrate). Some fast-acting carbs are: ? 1 tube of glucose gel. ? 3 sugar tablets (glucose pills). ? 6-8 pieces of hard candy. ? 4 oz (120 mL) of fruit juice. ? 4 oz (120 mL) of regular (not diet) soda.  Check your blood sugar 15 minutes after you take the carb.  If your blood sugar is still at or below 70 mg/dL (3.9 mmol/L), take 15 grams of a carb again.  If your blood sugar does not go above 70 mg/dL (3.9 mmol/L) after  3 tries, get help right away.  After your blood sugar goes back to normal, eat a meal or a snack within 1 hour.  Treating Very Low Blood Sugar If your blood sugar is at or below 54 mg/dL (3 mmol/L), you have very low blood sugar (severe hypoglycemia). This is an emergency. Do not wait to see if the symptoms will go away. Get medical help right away. Call your local emergency services (911 in the [place] Do not drive yourself to the hospital. If you have very low blood sugar and you cannot eat or drink, you may need a glucagon shot (injection). A family member or friend should learn how to check your blood sugar and how to give you a glucagon shot. Ask your doctor if you need to have a glucagon shot kit at home. Follow these instructions at home: General instructions  Avoid any diets that cause you to not eat enough food. Talk with your doctor before you start any new diet.  Take over-the-counter and prescription medicines only as told by your doctor.  Limit alcohol to no more than 1 drink per day for nonpregnant women and 2 drinks per day for men. One drink equals 12 oz of beer, 5 oz of wine, or 1 oz of hard liquor.  Keep all follow-up visits as told by your doctor. This is important. If You Have Diabetes:   Make sure you know the symptoms of low blood sugar.  Always keep a source of sugar with you, such as: ? Sugar. ? Sugar tablets. ? Glucose gel. ? Fruit juice. ? Regular soda (not diet soda). ? Milk. ? Hard candy. ? Honey.  Take your medicines as told.  Follow your exercise and meal plan. ? Eat on time. Do not skip meals. ? Follow your sick day plan when you cannot eat or drink normally. Make this plan ahead of time with your doctor.  Check your blood sugar as often as told by your doctor. Always check before and after exercise.  Share your diabetes care plan with: ? Your work or school. ? People you live with.  Check your pee (urine) for ketones: ? When you are  sick. ? As told by your doctor.  Carry a card or wear jewelry that says you have diabetes. If You Have Low Blood Sugar From Other Causes:   Check your blood sugar as often as told by your doctor.  Follow instructions from your doctor about what you cannot eat or drink. Contact a doctor if:  You have trouble keeping your blood sugar in your target range.  You have low blood sugar often. Get help right away if:  You still have symptoms after you eat or drink something sugary.  Your blood sugar is at or below 54 mg/dL (3 mmol/L).  You have jerky movements that you cannot control.  You pass out. These symptoms may be  an emergency. Do not wait to see if the symptoms will go away. Get medical help right away. Call your local emergency services (911 in the [place] Do not drive yourself to the hospital. This information is not intended to replace advice given to you by your health care provider. Make sure you discuss any questions you have with your health care provider. Document Released: 02/01/2010 Document Revised: 04/14/2016 Document Reviewed: 12/11/2015 Elsevier Interactive Patient Education  [employer].    Dr. Cyndia Skeeters

## 2017-07-07 NOTE — Assessment & Plan Note (Addendum)
Called the pharmacy who confirmed that patient has not filled his Victoza. I strongly encouraged him to fill his Victoza and start using it.  1. Check your blood glucose 4 times a day (15 minutes before each main meal and about 10 pm) 2-3 days a week and write down the numbers on the book we gave you. Please let us know if your blood glucose is less than 100 consistently for 3 to 4 days 2. Inject 50 units of Basglar before bedtime (at 10 PM) 3. Inject 10 units of NovoLog 3 times a day (15 minutes before each meal) 4. Continue taking the metformin 1000 mg twice a day 5. Please fill your prescription for Victoza and start using it. Follow the direction on the prescription. 6. Eat regular meals (breakfast, lunch and dinner) around-the-clock. You may snack as needed. See below about few tips and recommendations on diet and exercise.  7. Watch for symptoms of low blood sugar (hypoglycemia). Read below about these symptoms and management. 8. We have restarted your lisinopril at 5 mg daily 9. Please come back and see [place] in 3 months 10. Please bring all your medication bottles to that visit.  We will check vitamin B12 level today.

## 2017-07-07 NOTE — Assessment & Plan Note (Signed)
Likely due to poorly controlled diabetes. There could also be some component of PAD contributing to this. We will start gabapentin at 300 mg nightly. He may go up to 3 times a day if needed. Warned about the side effects including drowsiness and sedation.

## 2017-07-07 NOTE — Assessment & Plan Note (Signed)
Patient was very faint DP and PT pulses bilaterally. Both feet are cold to touch. Also with shining skins over the lower legs. He has significant smoking history. He will benefit from ABI. Recommended follow-up with Dr. Valentina Lucks in 2 weeks. He is already on atorvastatin and aspirin. I strongly recommended daily walking.

## 2017-07-07 NOTE — Assessment & Plan Note (Signed)
He is on atorvastatin and baby aspirin. Continued medications. Will check lipid panel today.

## 2017-07-08 LAB — LIPID PANEL
CHOLESTEROL TOTAL: 76 mg/dL — AB (ref 100–199)
Chol/HDL Ratio: 2.6 ratio (ref 0.0–5.0)
HDL: 29 mg/dL — ABNORMAL LOW (ref >39)
LDL Calculated: 25 mg/dL (ref 0–99)
TRIGLYCERIDES: 108 mg/dL (ref 0–149)
VLDL CHOLESTEROL CAL: 22 mg/dL (ref 5–40)

## 2017-07-08 LAB — BASIC METABOLIC PANEL
BUN / CREAT RATIO: 12 (ref 10–24)
BUN: 12 mg/dL (ref 8–27)
CHLORIDE: 93 mmol/L — AB (ref 96–106)
CO2: 23 mmol/L (ref 20–29)
Calcium: 9.8 mg/dL (ref 8.6–10.2)
Creatinine, Ser: 1.01 mg/dL (ref 0.76–1.27)
GFR calc Af Amer: 90 mL/min/{1.73_m2} (ref >59)
GFR calc non Af Amer: 78 mL/min/{1.73_m2} (ref >59)
Glucose: 233 mg/dL — ABNORMAL HIGH (ref 65–99)
Potassium: 5.1 mmol/L (ref 3.5–5.2)
SODIUM: 134 mmol/L (ref 134–144)

## 2017-07-08 LAB — VITAMIN B12: Vitamin B-12: 220 pg/mL — ABNORMAL LOW (ref 232–1245)

## 2017-07-10 ENCOUNTER — Encounter: Payer: Self-pay | Admitting: Student

## 2017-07-10 NOTE — Progress Notes (Signed)
Vit B12 level  low. I recommended taking vit B12 daily. This is available over the counter. Patient voiced understanding and agrees to the plan. Result letter routed to admin for mail out.

## 2017-08-07 ENCOUNTER — Other Ambulatory Visit: Payer: Self-pay | Admitting: Family Medicine

## 2017-08-07 ENCOUNTER — Other Ambulatory Visit: Payer: Self-pay | Admitting: Student

## 2017-08-07 DIAGNOSIS — I10 Essential (primary) hypertension: Secondary | ICD-10-CM

## 2017-08-12 ENCOUNTER — Other Ambulatory Visit: Payer: Self-pay | Admitting: Family Medicine

## 2017-09-05 ENCOUNTER — Ambulatory Visit: Payer: Medicare Other | Admitting: Student

## 2017-09-05 ENCOUNTER — Other Ambulatory Visit: Payer: Self-pay | Admitting: Student

## 2017-09-05 DIAGNOSIS — E11319 Type 2 diabetes mellitus with unspecified diabetic retinopathy without macular edema: Secondary | ICD-10-CM

## 2017-09-05 MED ORDER — BASAGLAR KWIKPEN 100 UNIT/ML ~~LOC~~ SOPN: 50.0000 [IU] | PEN_INJECTOR | Freq: Every day | SUBCUTANEOUS | 0 refills | Status: DC

## 2017-09-25 ENCOUNTER — Encounter: Payer: Self-pay | Admitting: Student

## 2017-09-25 ENCOUNTER — Ambulatory Visit (INDEPENDENT_AMBULATORY_CARE_PROVIDER_SITE_OTHER): Payer: Medicare Other | Admitting: Student

## 2017-09-25 VITALS — BP 118/60 | HR 98 | Temp 98.2°F | Wt 143.8 lb

## 2017-09-25 DIAGNOSIS — E11319 Type 2 diabetes mellitus with unspecified diabetic retinopathy without macular edema: Secondary | ICD-10-CM | POA: Diagnosis present

## 2017-09-25 DIAGNOSIS — M79605 Pain in left leg: Secondary | ICD-10-CM

## 2017-09-25 DIAGNOSIS — E785 Hyperlipidemia, unspecified: Secondary | ICD-10-CM

## 2017-09-25 DIAGNOSIS — E1169 Type 2 diabetes mellitus with other specified complication: Secondary | ICD-10-CM | POA: Diagnosis not present

## 2017-09-25 LAB — POCT GLYCOSYLATED HEMOGLOBIN (HGB A1C): HEMOGLOBIN A1C: 13.4

## 2017-09-25 MED ORDER — GABAPENTIN 300 MG PO CAPS: ORAL_CAPSULE | ORAL | 3 refills | Status: DC

## 2017-09-25 MED ORDER — ATORVASTATIN CALCIUM 40 MG PO TABS: 40.0000 mg | ORAL_TABLET | Freq: Every day | ORAL | 3 refills | Status: DC

## 2017-09-25 NOTE — Progress Notes (Signed)
Subjective:    Alec York is a 66 y.o. old male here stabbing pain in right foot and right leg.  HPI Right foot and leg stabbing pain: for three weeks. It is on and off. Denies fever. Vaseline and topical ice gel. No pain with walking. Pain worse with sitting. Unfortunately, he didnot follow-up with Dr. Valentina Lucks for ABI when I recommended at his last visit with me.  Denies fever or swelling.  His diabetes is poorly controlled with last A1c greater than 15%.  He also has history of diabetic neuropathy and retinopathy.  Diabetes: taking basaglar 50 units before bedtime, novolog 10 units before meals, victoza 0.6 and metformin 1000 mg twice daily.  Reports good compliance with medication.  Reports checking his blood glucose before meals twice a day.  He did not bring his glucose log to this visit.  He says he is lost number is 120 and his highest number is 160.  He has diabetic retinopathy.  He is followed by ophthalmology. He reports stabbing left foot and leg pain likely due to his diabetic neuropathy. Otherwise, denies other symptoms of hyperglycemia or hypoglycemia.   PMH/Problem List: has Acid reflux; Type 2 diabetes mellitus with diabetic retinopathy ([place]); Essential hypertension; Hyperlipidemia associated with type 2 diabetes mellitus ([place]); Bursitis of right shoulder; Allergic rhinitis; Adhesive capsulitis of right shoulder; Nonspecific abnormal electrocardiogram (ECG) (EKG); Knee pain, chronic; PAD (peripheral artery disease) ([place]); and Neuropathic pain on their problem list.   has a past medical history of Allergy, Diabetes mellitus without complication ([place]), Diabetic retinopathy ([place]), GERD (gastroesophageal reflux disease), Hyperlipidemia, and Hypertension.  FH:  Family History  Problem Relation Age of Onset  . Diabetes Neg Hx     SH Social History   Tobacco Use  . Smoking status: Former Research scientist (life sciences)  . Smokeless tobacco: Never Used  Substance Use Topics  . Alcohol use: No   Alcohol/week: 0.0 oz  . Drug use: No    Review of Systems Review of systems negative except for pertinent positives and negatives in history of present illness above.     Objective:     Vitals:   09/25/17 1347  BP: 118/60  Pulse: 98  Temp: 98.2 F (36.8 C)  TempSrc: Oral  SpO2: 98%  Weight: 143 lb 12.8 oz (65.2 kg)   Body mass index is 21.24 kg/m.  Physical Exam GENERAL: appears well, no ditress NECK: Supple LUNGS:  No IWOB, good air movement, CTAB HEART:  RRR, normal heart sounds ABD:  soft, NT with active BS EXT: See foot exam below NEURO:   diminished sensation on monofilament exam bilaterally PSYCH: normal affect   Diabetic foot exam Inspection: Dry, lichinified leathery skins over the distal half of his legs, toenails trimmed Vascular: DP pulses 1+ bilaterally, PT pulses 2+ bilaterally Neuro: Significantly diminished sharp sensation on monofilament exam bilaterally    Assessment and Plan:  1. Type 2 diabetes mellitus with retinopathy, macular edema presence unspecified, unspecified laterality, unspecified retinopathy severity, unspecified whether long term insulin use ([place]): Poorly controlled but improving.  A1c 13.6% today; > 15% previously -We will continue Basglar 50 units at bedtime, NovoLog 10 units before meals, Victoza 1.2 mg and metformin 1000 mg twice daily -Discussed about lifestyle change including diet and exercise.  Gave handout as well. -Referral to Dr. Valentina Lucks for ABI  2.  Leg pain, diffuse, left: I think this is a combination of neuropathy and PAD.  He has poorly controlled diabetes. He had 1+ DP pulses bilaterally although his PT  pulses are good.  Increase gabapentin to 600 mg before bedtime, 300 mg with breakfast and 300 mg with lunch.  Follow-up with Dr. Valentina Lucks for ABI.   3. Hyperlipidemia associated with type 2 diabetes mellitus ([place]): Changed his simvastatin to atorvastatin.  We will continue baby aspirin  Return in about 1 week (around  10/02/2017) for ABI with Dr. Valentina Lucks.  Mercy Riding, MD 09/25/17 Pager: [phone]

## 2017-09-25 NOTE — Patient Instructions (Signed)
It was great seeing you today! We have addressed the following issues today Right foot and leg pain: this is likely neuropathic pain due to your diabetes.  I have increased your gabapentin dose.  I also recommend you come and see a pharmacist to evaluate the blood vessels in the legs.  Please check your feet daily.  See below for your diabetes  Diabetes: your A1c is 13.6% today. It was greater than 15.0 %. Your goal A1c is less than 8.0 %. See below for more information about A1c.  1. Check your blood glucose 4 times a day (15 minutes before each main meal and about 10 pm) 2-3 days a week and write down the numbers on the book we gave you 2. Inject 50 units of Basaglar before bedtime (at 10 PM) 3. Inject 10 units of NovoLog 3 times a day (15 minutes before each meal) 4. Injects 1.2 mg of your Victoza daily 5. Eat regular meals (breakfast, lunch and dinner) around-the-clock. You may snack as needed. See below about few tips and recommendations on diet and exercise.  6. Watch for symptoms of low blood sugar (hypoglycemia). Read below about these symptoms and management  7. Please come back and see [place] in three months.  8. Please bring all your medication bottles to that visit.   What is A1c:  The A1C test result reflects your average blood sugar level for the past two to three months. Specifically, the A1C test measures what percentage of your hemoglobin - a protein in red blood cells that carries oxygen - is coated with sugar (glycated). The higher your A1C level, the poorer your blood sugar control and the higher your risk of diabetes complications. Portion Size    Choose healthier foods such as 100% whole grains, vegetables, fruits, beans, nut seeds, olive oil, most vegetable oils, fat-free dietary, wild game and fish.   Avoid sweet tea, other sweetened beverages, soda, fruit juice, cold cereal and milk and trans fat.   Eat at least 3 meals and 1-2 snacks per day.  Aim for no more than 5  hours between eating.  Eat breakfast within one hour of getting up.    Exercise at least 150 minutes per week, including weight resistance exercises 3 or 4 times per week.   Try to lose at least 7-10% of your current body weight.   Hypoglycemia Hypoglycemia is when the sugar (glucose) level in the blood is too low. Symptoms of low blood sugar may include:  Feeling: ? Hungry. ? Worried or nervous (anxious). ? Sweaty and clammy. ? Confused. ? Dizzy. ? Sleepy. ? Sick to your stomach (nauseous).  Having: ? A fast heartbeat. ? A headache. ? A change in your vision. ? Jerky movements that you cannot control (seizure). ? Nightmares. ? Tingling or no feeling (numbness) around the mouth, lips, or tongue.  Having trouble with: ? Talking. ? Paying attention (concentrating). ? Moving (coordination). ? Sleeping.  Shaking.  Passing out (fainting).  Getting upset easily (irritability).  Low blood sugar can happen to people who have diabetes and people who do not have diabetes. Low blood sugar can happen quickly, and it can be an emergency. Treating Low Blood Sugar Low blood sugar is often treated by eating or drinking something sugary right away. If you can think clearly and swallow safely, follow the 15:15 rule:  Take 15 grams of a fast-acting carb (carbohydrate). Some fast-acting carbs are: ? 1 tube of glucose gel. ? 3 sugar tablets (glucose  pills). ? 6-8 pieces of hard candy. ? 4 oz (120 mL) of fruit juice. ? 4 oz (120 mL) of regular (not diet) soda.  Check your blood sugar 15 minutes after you take the carb.  If your blood sugar is still at or below 70 mg/dL (3.9 mmol/L), take 15 grams of a carb again.  If your blood sugar does not go above 70 mg/dL (3.9 mmol/L) after 3 tries, get help right away.  After your blood sugar goes back to normal, eat a meal or a snack within 1 hour.  Treating Very Low Blood Sugar If your blood sugar is at or below 54 mg/dL (3 mmol/L), you  have very low blood sugar (severe hypoglycemia). This is an emergency. Do not wait to see if the symptoms will go away. Get medical help right away. Call your local emergency services (911 in the [place] Do not drive yourself to the hospital. If you have very low blood sugar and you cannot eat or drink, you may need a glucagon shot (injection). A family member or friend should learn how to check your blood sugar and how to give you a glucagon shot. Ask your doctor if you need to have a glucagon shot kit at home. Follow these instructions at home: General instructions  Avoid any diets that cause you to not eat enough food. Talk with your doctor before you start any new diet.  Take over-the-counter and prescription medicines only as told by your doctor.  Limit alcohol to no more than 1 drink per day for nonpregnant women and 2 drinks per day for men. One drink equals 12 oz of beer, 5 oz of wine, or 1 oz of hard liquor.  Keep all follow-up visits as told by your doctor. This is important. If You Have Diabetes:   Make sure you know the symptoms of low blood sugar.  Always keep a source of sugar with you, such as: ? Sugar. ? Sugar tablets. ? Glucose gel. ? Fruit juice. ? Regular soda (not diet soda). ? Milk. ? Hard candy. ? Honey.  Take your medicines as told.  Follow your exercise and meal plan. ? Eat on time. Do not skip meals. ? Follow your sick day plan when you cannot eat or drink normally. Make this plan ahead of time with your doctor.  Check your blood sugar as often as told by your doctor. Always check before and after exercise.  Share your diabetes care plan with: ? Your work or school. ? People you live with.  Check your pee (urine) for ketones: ? When you are sick. ? As told by your doctor.  Carry a card or wear jewelry that says you have diabetes. If You Have Low Blood Sugar From Other Causes:   Check your blood sugar as often as told by your doctor.  Follow  instructions from your doctor about what you cannot eat or drink. Contact a doctor if:  You have trouble keeping your blood sugar in your target range.  You have low blood sugar often. Get help right away if:  You still have symptoms after you eat or drink something sugary.  Your blood sugar is at or below 54 mg/dL (3 mmol/L).  You have jerky movements that you cannot control.  You pass out. These symptoms may be an emergency. Do not wait to see if the symptoms will go away. Get medical help right away. Call your local emergency services (911 in the [place] Do not drive  yourself to the hospital. This information is not intended to replace advice given to you by your health care provider. Make sure you discuss any questions you have with your health care provider. Document Released: 02/01/2010 Document Revised: 04/14/2016 Document Reviewed: 12/11/2015 Elsevier Interactive Patient Education  [employer].

## 2017-09-28 ENCOUNTER — Ambulatory Visit: Payer: Medicare Other | Admitting: Student

## 2017-10-04 ENCOUNTER — Other Ambulatory Visit: Payer: Self-pay | Admitting: Student

## 2017-10-04 DIAGNOSIS — E11319 Type 2 diabetes mellitus with unspecified diabetic retinopathy without macular edema: Secondary | ICD-10-CM

## 2017-10-05 ENCOUNTER — Ambulatory Visit: Payer: Medicare Other | Admitting: Pharmacist

## 2017-10-19 ENCOUNTER — Ambulatory Visit (INDEPENDENT_AMBULATORY_CARE_PROVIDER_SITE_OTHER): Payer: Medicare Other | Admitting: Pharmacist

## 2017-10-19 ENCOUNTER — Encounter: Payer: Self-pay | Admitting: Pharmacist

## 2017-10-19 DIAGNOSIS — M7989 Other specified soft tissue disorders: Secondary | ICD-10-CM | POA: Diagnosis not present

## 2017-10-19 NOTE — Progress Notes (Signed)
   S:    Patient arrives in good spirits, ambulating without assistance, accompanied by his wife. She presents to the clinic for PADABI evaluation. Patient was referred on 09/25/17 by Dr. Cyndia Skeeters. Patient was last seen by Primary Care Provider on 09/25/17.   Family History: Brother with T2DM and amputation at age 64. Former smoker - smoked 7 cigarettes/day from age 17 to 7 years old. Quit smoking 4-5 years ago. No history of heart issues.  Patient reports pain with walking. Pain is described as stabbing, tingling, sharp, tightness, right leg is worse than left (heat in right leg when left leg is cool). Reports pain starting while at rest or standing still. Reports pain worsens when walking up hill or in a hurry. Reports pain when walking at an ordinary pace on a level surface; denies pain resolves on sitting. Pain is localized to right leg and has been occurring for the past month.  O:  Lower extremity Physical Exam includes diminished pulses, thinning of limb hair, thinning of subcutaneous fat, thickened brittle nails; right worse than left. Of note, patient reported thickened toe nails since age 66.   ABI overall = unable to be calculated. Right Arm 162 mmHg    Left Arm 158 mmHg Right ankle posterior tibial >200 mmHg     dorsalis pedis 146 mmHg Left ankle posterior tibial 184 mmHg    dorsalis pedis 192 mmHg  A/P: Leg Pain primarily in Right leg over the last month.  ABI performed. Unable to calculate ABI due to calcification. Consulted Dr. Andria Frames and Dr. Gerarda Fraction who noted significant tightness, heat, and difference in right leg compared to patient's left leg. Patient was referred to vascular lab for leg ultrasound ASAP (soonest appointment available was tomorrow morning) to rule out DVT. Patient was advised to immediately report to the ER with any s/sx of SOB or chest pain - patient expressed understanding. No changes were made to his medications at this time. Will follow-up once we get the results  from the ultrasound.  Results reviewed and written information provided. Total time in face-to-face counseling 35 minutes.  Patient seen with Drusilla Kanner, PharmD Candidate, Leeroy Cha, PharmD PGY-1 Resident, and Deirdre Pippins, PharmD, PGY2 Resident.

## 2017-10-19 NOTE — Progress Notes (Signed)
Patient ID: Alec York, male   DOB: 1951/09/22, 66 y.o.   MRN: [id number] Reviewed: Agree with Dr. Graylin Shiver documentation and management.

## 2017-10-19 NOTE — Assessment & Plan Note (Signed)
Leg Pain primarily in Right leg over the last month.  ABI performed. Unable to calculate ABI due to calcification. Consulted Dr. Andria Frames and Dr. Gerarda Fraction who noted significant tightness, heat, and difference in right leg compared to patient's left leg. Patient was referred to vascular lab for leg ultrasound ASAP (soonest appointment available was tomorrow morning) to rule out DVT. Patient was advised to immediately report to the ER with any s/sx of SOB or chest pain - patient expressed understanding. No changes were made to his medications at this time. Will follow-up once we get the results from the ultrasound.

## 2017-10-19 NOTE — Progress Notes (Addendum)
Went to evaluate patient after concern for unilateral leg swelling with pain. Patient was being seen in pharmacy clinic for ABIs. Has extensive [facility] of PAD. Patient states that leg swelling has been present for 1 month. Endorses pain with walking.  BP 136/66 (BP Location: Left Arm, Patient Position: Sitting, Cuff Size: Normal)   Pulse 78   Ht 5\' 8"  (1.727 m)   Wt 66.9 kg (147 lb 6.4 oz)   SpO2 96%   BMI 22.41 kg/m  PE: General - well appearing, no acute distress Respiratory - normal work of breathing Extremities - right leg with increased swelling compared to left. Some mild erythema. Positive Homan's sign on right. Calf tenderness to palpation. No excessive warmth. Right leg also with 1+ pitting edema up to knee.  Gait - abnormal  A/P: Concern for possible DVT -Order given to obtain venous doppler US STAT; order placed  Luiz Blare, DO 10/19/17  Vascular evaluation - negative for DVT.

## 2017-10-19 NOTE — Patient Instructions (Signed)
Thanks for coming in to see [place] today.  1. We have referred you to get an ultrasound on your leg tomorrow morning at 9:00.  2. If you have any symptoms of shortness of breath or pain in your chest, go to the emergency room immediately.  3. We will get the results from your ultrasound and follow-up with you.

## 2017-10-20 ENCOUNTER — Telehealth: Payer: Self-pay | Admitting: Student

## 2017-10-20 ENCOUNTER — Ambulatory Visit (HOSPITAL_COMMUNITY)
Admission: RE | Admit: 2017-10-20 | Discharge: 2017-10-20 | Disposition: A | Discharge status: Home / Self Care | Payer: Medicare Other | Source: Ambulatory Visit | Attending: Family Medicine | Admitting: Family Medicine

## 2017-10-20 DIAGNOSIS — M7989 Other specified soft tissue disorders: Secondary | ICD-10-CM | POA: Diagnosis not present

## 2017-10-20 DIAGNOSIS — I739 Peripheral vascular disease, unspecified: Secondary | ICD-10-CM

## 2017-10-20 DIAGNOSIS — R590 Localized enlarged lymph nodes: Secondary | ICD-10-CM | POA: Insufficient documentation

## 2017-10-20 NOTE — Progress Notes (Signed)
*  PRELIMINARY RESULTS* Vascular Ultrasound Right lower extremity venous duplex has been completed.  Preliminary findings: No evidence of deep vein thrombosis or baker's cyst in the right lower extremity.  Prominent lymph nodes noted bilaterally in the groin area.  Attempted to call Dr. Jamey Ripa office at 9:15, no answer.   Myrtie Cruise Derius Ghosh 10/20/2017, 9:20 AM

## 2017-10-20 NOTE — Telephone Encounter (Signed)
Called and talked to patient about his LE ultrasound which is negative for DVT. ABI performed in clinic but not able to calculate due to calcified arteries. I told him we are referring him to vascular surgery to evaluate his arteries. I encouraged him to continue taking his ASA, atorvastatin, and walk more. Advised him to call our office if he doesn't hear from any one in the next couple of weeks. He voiced understanding and agrees. He appreciated the call. We will follow up on the bilateral femoral LAD.

## 2018-02-22 ENCOUNTER — Ambulatory Visit: Payer: Medicare Other | Admitting: Student

## 2018-03-22 ENCOUNTER — Ambulatory Visit (INDEPENDENT_AMBULATORY_CARE_PROVIDER_SITE_OTHER): Payer: Medicare HMO | Admitting: Student

## 2018-03-22 ENCOUNTER — Encounter: Payer: Self-pay | Admitting: Student

## 2018-03-22 VITALS — BP 110/60 | HR 77 | Temp 97.7°F | Wt 148.0 lb

## 2018-03-22 DIAGNOSIS — E11319 Type 2 diabetes mellitus with unspecified diabetic retinopathy without macular edema: Secondary | ICD-10-CM

## 2018-03-22 DIAGNOSIS — R590 Localized enlarged lymph nodes: Secondary | ICD-10-CM | POA: Diagnosis not present

## 2018-03-22 LAB — POCT UA - MICROALBUMIN
Creatinine, POC: 50 mg/dL
MICROALBUMIN (UR) POC: 30 mg/L

## 2018-03-22 LAB — POCT GLYCOSYLATED HEMOGLOBIN (HGB A1C): Hemoglobin A1C: 14.2

## 2018-03-22 MED ORDER — BASAGLAR KWIKPEN 100 UNIT/ML ~~LOC~~ SOPN: 50.0000 [IU] | PEN_INJECTOR | Freq: Every morning | SUBCUTANEOUS | 3 refills | Status: DC

## 2018-03-22 MED ORDER — LIRAGLUTIDE 18 MG/3ML ~~LOC~~ SOPN: 1.8000 mg | PEN_INJECTOR | Freq: Every day | SUBCUTANEOUS | 3 refills | Status: DC

## 2018-03-22 NOTE — Patient Instructions (Addendum)
It was great seeing you today! We have addressed the following issues today Neck swelling/lymphadenopathy: We have scheduled an ultrasound of your neck for 03/23/2018 at [place] [facility] on [address] at 10:30 AM.  This arrived at your appointment about 20 minutes early.  Diabetes: your A1c is 13.4% today. It was 14.2%. Your goal A1c is less than 7.5%. See below for more information about A1c.  1. Check your blood glucose 4 times a day (15 minutes before each main meal and about 10 pm) 2-3 days a week and write down the numbers on the book we gave you 2. Inject 50 units of Basaglar in the morning 3. Inject 10 units of NovoLog 3 times a day (15 minutes before each meal) 4. Increase your Victoza to 1.8 mg daily 5. Eat regular meals (breakfast, lunch and dinner) around-the-clock. You may snack as needed. See below about few tips and recommendations on diet and exercise.  6. Watch for symptoms of low blood sugar (hypoglycemia). Read below about these symptoms and management. 7. Please come back and see [place] in 1 week 8. Please bring all your medication bottles to that visit.   What is A1c:  The A1C test result reflects your average blood sugar level for the past two to three months. Specifically, the A1C test measures what percentage of your hemoglobin - a protein in red blood cells that carries oxygen - is coated with sugar (glycated). The higher your A1C level, the poorer your blood sugar control and the higher your risk of diabetes complications. Portion Size    Choose healthier foods such as 100% whole grains, vegetables, fruits, beans, nut seeds, olive oil, most vegetable oils, fat-free dietary, wild game and fish.   Avoid sweet tea, other sweetened beverages, soda, fruit juice, cold cereal and milk and trans fat.   Eat at least 3 meals and 1-2 snacks per day.  Aim for no more than 5 hours between eating.  Eat breakfast within one hour of getting up.    Exercise at least  150 minutes per week, including weight resistance exercises 3 or 4 times per week.   Try to lose at least 7-10% of your current body weight.   Hypoglycemia Hypoglycemia is when the sugar (glucose) level in the blood is too low. Symptoms of low blood sugar may include:  Feeling: ? Hungry. ? Worried or nervous (anxious). ? Sweaty and clammy. ? Confused. ? Dizzy. ? Sleepy. ? Sick to your stomach (nauseous).  Having: ? A fast heartbeat. ? A headache. ? A change in your vision. ? Jerky movements that you cannot control (seizure). ? Nightmares. ? Tingling or no feeling (numbness) around the mouth, lips, or tongue.  Having trouble with: ? Talking. ? Paying attention (concentrating). ? Moving (coordination). ? Sleeping.  Shaking.  Passing out (fainting).  Getting upset easily (irritability).  Low blood sugar can happen to people who have diabetes and people who do not have diabetes. Low blood sugar can happen quickly, and it can be an emergency. Treating Low Blood Sugar Low blood sugar is often treated by eating or drinking something sugary right away. If you can think clearly and swallow safely, follow the 15:15 rule:  Take 15 grams of a fast-acting carb (carbohydrate). Some fast-acting carbs are: ? 1 tube of glucose gel. ? 3 sugar tablets (glucose pills). ? 6-8 pieces of hard candy. ? 4 oz (120 mL) of fruit juice. ? 4 oz (120 mL) of regular (not diet) soda.  Check your blood sugar 15 minutes after you take the carb.  If your blood sugar is still at or below 70 mg/dL (3.9 mmol/L), take 15 grams of a carb again.  If your blood sugar does not go above 70 mg/dL (3.9 mmol/L) after 3 tries, get help right away.  After your blood sugar goes back to normal, eat a meal or a snack within 1 hour.  Treating Very Low Blood Sugar If your blood sugar is at or below 54 mg/dL (3 mmol/L), you have very low blood sugar (severe hypoglycemia). This is an emergency. Do not wait to see  if the symptoms will go away. Get medical help right away. Call your local emergency services (911 in the [place] Do not drive yourself to the hospital. If you have very low blood sugar and you cannot eat or drink, you may need a glucagon shot (injection). A family member or friend should learn how to check your blood sugar and how to give you a glucagon shot. Ask your doctor if you need to have a glucagon shot kit at home. Follow these instructions at home: General instructions  Avoid any diets that cause you to not eat enough food. Talk with your doctor before you start any new diet.  Take over-the-counter and prescription medicines only as told by your doctor.  Limit alcohol to no more than 1 drink per day for nonpregnant women and 2 drinks per day for men. One drink equals 12 oz of beer, 5 oz of wine, or 1 oz of hard liquor.  Keep all follow-up visits as told by your doctor. This is important. If You Have Diabetes:   Make sure you know the symptoms of low blood sugar.  Always keep a source of sugar with you, such as: ? Sugar. ? Sugar tablets. ? Glucose gel. ? Fruit juice. ? Regular soda (not diet soda). ? Milk. ? Hard candy. ? Honey.  Take your medicines as told.  Follow your exercise and meal plan. ? Eat on time. Do not skip meals. ? Follow your sick day plan when you cannot eat or drink normally. Make this plan ahead of time with your doctor.  Check your blood sugar as often as told by your doctor. Always check before and after exercise.  Share your diabetes care plan with: ? Your work or school. ? People you live with.  Check your pee (urine) for ketones: ? When you are sick. ? As told by your doctor.  Carry a card or wear jewelry that says you have diabetes. If You Have Low Blood Sugar From Other Causes:   Check your blood sugar as often as told by your doctor.  Follow instructions from your doctor about what you cannot eat or drink. Contact a doctor  if:  You have trouble keeping your blood sugar in your target range.  You have low blood sugar often. Get help right away if:  You still have symptoms after you eat or drink something sugary.  Your blood sugar is at or below 54 mg/dL (3 mmol/L).  You have jerky movements that you cannot control.  You pass out. These symptoms may be an emergency. Do not wait to see if the symptoms will go away. Get medical help right away. Call your local emergency services (911 in the [place] Do not drive yourself to the hospital. This information is not intended to replace advice given to you by your health care provider. Make sure you discuss any questions  you have with your health care provider. Document Released: 02/01/2010 Document Revised: 04/14/2016 Document Reviewed: 12/11/2015 Elsevier Interactive Patient Education  [employer].

## 2018-03-22 NOTE — Progress Notes (Signed)
Subjective:    Alec York is a 67 y.o. old male here for follow-up on diabetes.  HPI Lump on his right neck: for two months. No change in size. No recent illness, fever, toothache, cough, excessive night sweat, unintentional weight loss. Denies any lump elsewhere.  Reports history of tobacco use.  Smoked 4 to 5 cigarettes since he was 67 years of age and quit about 4 years ago.  Denies history of alcohol use.  Denies family history of cancer.  Diabetes: A1c 14.2%.  It was 13.4 previously about 5 months ago.  He is on Basaglar 50 units, NovoLog 10 units 3 times daily, Victoza 1.2 mg daily and metformin 1000 mg twice daily.  She reports good compliance with his medication except his NovoLog.  He reports skipping his NovoLog dose intermittently. Cooks and eats at home.  Denies drinking soda or drinking juice.  He does not exercise.  He reports a lot of life stress.  He is also stressed out about the lump on his right neck.  He is worried that it could be a cancer.  He also reports deteriorating vision.  He says he has an upcoming appointment with an eye doctor in a week.  PMH/Problem List: has Acid reflux; Type 2 diabetes mellitus with diabetic retinopathy ([place]); Essential hypertension; Hyperlipidemia associated with type 2 diabetes mellitus ([place]); Bursitis of right shoulder; Allergic rhinitis; Adhesive capsulitis of right shoulder; Nonspecific abnormal electrocardiogram (ECG) (EKG); Knee pain, chronic; PAD (peripheral artery disease) ([place]); Neuropathic pain; Right leg swelling; and Cervical lymphadenopathy on their problem list.   has a past medical history of Allergy, Diabetes mellitus without complication ([place]), Diabetic retinopathy ([place]), GERD (gastroesophageal reflux disease), Hyperlipidemia, and Hypertension.  FH:  Family History  Problem Relation Age of Onset  . Diabetes Neg Hx     SH Social History   Tobacco Use  . Smoking status: Former Research scientist (life sciences)  . Smokeless tobacco: Never Used    Substance Use Topics  . Alcohol use: No    Alcohol/week: 0.0 oz  . Drug use: No    Review of Systems Review of systems negative except for pertinent positives and negatives in history of present illness above.     Objective:     Vitals:   03/22/18 0839  BP: 110/60  Pulse: 77  Temp: 97.7 F (36.5 C)  TempSrc: Oral  SpO2: 99%  Weight: 148 lb (67.1 kg)   Body mass index is 22.5 kg/m.  Physical Exam  GEN: appears well & comfortable. No apparent distress. Head: normocephalic and atraumatic  Eyes: conjunctiva without injection. Sclera anicteric.  Arcus senilis Ears: external ear, ear canal and TM normal Oropharynx: MMM. No erythema. No exudation or petechiae.  Uvula midline.  Very poor dentition. HEM: Cervical lymphadenopathy about 2 cm in diameter over the posterior aspect of right SCM.  Soft to touch.  Mobile.  No overlying skin erythema or tenderness.  No anterior cervical, supraclavicular or axillary lymphadenopathy. CVS: RRR, nl s1 & s2, no murmurs, no edema.  RESP: no IWOB, good air movement bilaterally, CTAB GI: BS present & normal, soft, NTND.  No palpable mass. MSK: no focal tenderness or notable swelling ENDO: negative thyromegally NEURO: alert and oiented appropriately, no gross deficits   PSYCH: euthymic mood with congruent affect  Diabetic Foot Exam: Inspection: tight glistening skin over lower half of his lower legs bilaterally.  Scabbed circular wound over left lateral malleolus without surrounding skin erythema or underlying abscess. Toe nails trimmed.  Vascular: No palpable DP  or PT pulses bilaterally Neuro: Intact 9-point monofilament exam in fourth and fifth toes only.    Assessment and Plan:  1. Type 2 diabetes mellitus with retinopathy of both eyes, without long-term current use of insulin, macular edema presence unspecified, unspecified retinopathy severity ([place]): Poorly controlled.  A1c 14.2% today.  It was 13.5% previously.  He reports good  compliance his medication but I doubt about this.  Recommend increasing his Victoza to 1.8 mg daily.  Continue Basaglar at 50 units every morning.  We will continue NovoLog 10 units 3 times daily and metformin 1000 mg twice daily.  Recommend the importance of taking his medications regularly.  We also discussed about lifestyle change including diet and exercise.  Unfortunately, he is now anxious about his lymphadenopathy.  Foot exam as above.  Given prescription for diabetic shoe and [facility].  He has an upcoming appointment with ophthalmologist in a week.  Will check urine microalbumin today. I recommended follow-up with Dr. Valentina Lucks in 2 weeks.  Patient declined [facility] today.  2. Cervical lymphadenopathy: For 2 months now.  He has no constitutional symptoms.  On exam, he had a palpable, soft and mobile lump over the posterior aspect of her right SCM concerning for lymphadenopathy.  He has no anterior cervical, supraclavicular or axillary lymphadenopathy.  Given history of smoking and his age, this is concerning for malignancy.  Ordered stat ultrasound which is a scheduled for tomorrow at 10:30 AM at [place] [facility] on [address]  Depending on the result, we may have to refer him to ENT urgently. - CBC with Differential/Platelet - US Soft Tissue Head/Neck; Future  Return in about 2 weeks (around 04/05/2018) for Diabetes with Dr. Valentina Lucks.  Mercy Riding, MD 03/22/18 Pager: [phone]  Precepted with Dr. Wendy Poet

## 2018-03-23 ENCOUNTER — Telehealth: Payer: Self-pay | Admitting: Student

## 2018-03-23 ENCOUNTER — Ambulatory Visit
Admission: RE | Admit: 2018-03-23 | Discharge: 2018-03-23 | Disposition: A | Discharge status: Home / Self Care | Payer: Medicare HMO | Source: Ambulatory Visit | Attending: Family Medicine | Admitting: Family Medicine

## 2018-03-23 DIAGNOSIS — R590 Localized enlarged lymph nodes: Secondary | ICD-10-CM

## 2018-03-23 LAB — CBC WITH DIFFERENTIAL/PLATELET
Basophils Absolute: 0 10*3/uL (ref 0.0–0.2)
Basos: 0 %
EOS (ABSOLUTE): 0.2 10*3/uL (ref 0.0–0.4)
EOS: 2 %
HEMATOCRIT: 38.9 % (ref 37.5–51.0)
HEMOGLOBIN: 12.3 g/dL — AB (ref 13.0–17.7)
IMMATURE GRANS (ABS): 0 10*3/uL (ref 0.0–0.1)
IMMATURE GRANULOCYTES: 0 %
LYMPHS: 49 %
Lymphocytes Absolute: 6.3 10*3/uL — ABNORMAL HIGH (ref 0.7–3.1)
MCH: 26.7 pg (ref 26.6–33.0)
MCHC: 31.6 g/dL (ref 31.5–35.7)
MCV: 85 fL (ref 79–97)
MONOCYTES: 6 %
MONOS ABS: 0.8 10*3/uL (ref 0.1–0.9)
NEUTROS PCT: 43 %
Neutrophils Absolute: 5.4 10*3/uL (ref 1.4–7.0)
Platelets: 235 10*3/uL (ref 150–379)
RBC: 4.6 x10E6/uL (ref 4.14–5.80)
RDW: 13.5 % (ref 12.3–15.4)
WBC: 12.8 10*3/uL — AB (ref 3.4–10.8)

## 2018-03-23 NOTE — Telephone Encounter (Signed)
Called and discussed ultrasound finding and his CBC.  Ultrasound showed multiple well-circumscribed neck lesions are identified, consistent with lymph nodes, the largest of which measures 2.1 x 1.5 x 2.3 cm which is concerning for lymphoma or metastatic squamous cell carcinoma. He also has mild leukocytosis and lymphocytosis on CBC. It is hard to tell for sure what this could be, and that he may need biopsy or other test.  So, urgent referral to ENT is warranted. Patient voiced understanding and agrees.  He appreciated the call.  I placed an urgent referral to ENT.   He had no further question.

## 2018-03-28 ENCOUNTER — Telehealth: Payer: Self-pay

## 2018-03-28 NOTE — Telephone Encounter (Signed)
Received PA from [facility] for [employer]. Covered insulins include Lantus Solostar u-100 Toujeo solostar u-300 Hinda Kehr Flextouch u-100 Levemis flextouch u-100 If patient has not tried these, he will need to have his rx changed to one of the covered meds.  Wallace Cullens, RN

## 2018-03-30 ENCOUNTER — Other Ambulatory Visit: Payer: Self-pay | Admitting: Student

## 2018-03-30 DIAGNOSIS — E11319 Type 2 diabetes mellitus with unspecified diabetic retinopathy without macular edema: Secondary | ICD-10-CM

## 2018-03-30 MED ORDER — INSULIN GLARGINE 100 UNIT/ML SOLOSTAR PEN: 50.0000 [IU] | PEN_INJECTOR | Freq: Every morning | SUBCUTANEOUS | 11 refills | Status: DC

## 2018-03-30 NOTE — Telephone Encounter (Signed)
Changed to Lantus Solostar pen.

## 2018-04-02 DIAGNOSIS — R6889 Other general symptoms and signs: Secondary | ICD-10-CM | POA: Diagnosis not present

## 2018-04-06 ENCOUNTER — Ambulatory Visit: Payer: Medicare HMO | Admitting: Student

## 2018-04-06 DIAGNOSIS — E113512 Type 2 diabetes mellitus with proliferative diabetic retinopathy with macular edema, left eye: Secondary | ICD-10-CM | POA: Diagnosis not present

## 2018-04-06 DIAGNOSIS — H3582 Retinal ischemia: Secondary | ICD-10-CM | POA: Diagnosis not present

## 2018-04-06 DIAGNOSIS — E113591 Type 2 diabetes mellitus with proliferative diabetic retinopathy without macular edema, right eye: Secondary | ICD-10-CM | POA: Diagnosis not present

## 2018-04-06 DIAGNOSIS — R6889 Other general symptoms and signs: Secondary | ICD-10-CM | POA: Diagnosis not present

## 2018-04-12 DIAGNOSIS — R590 Localized enlarged lymph nodes: Secondary | ICD-10-CM | POA: Diagnosis not present

## 2018-04-12 DIAGNOSIS — R221 Localized swelling, mass and lump, neck: Secondary | ICD-10-CM | POA: Diagnosis not present

## 2018-04-12 DIAGNOSIS — R6889 Other general symptoms and signs: Secondary | ICD-10-CM | POA: Diagnosis not present

## 2018-04-12 DIAGNOSIS — D479 Neoplasm of uncertain behavior of lymphoid, hematopoietic and related tissue, unspecified: Secondary | ICD-10-CM | POA: Diagnosis not present

## 2018-04-12 DIAGNOSIS — R591 Generalized enlarged lymph nodes: Secondary | ICD-10-CM | POA: Insufficient documentation

## 2018-04-18 DIAGNOSIS — R6889 Other general symptoms and signs: Secondary | ICD-10-CM | POA: Diagnosis not present

## 2018-04-18 DIAGNOSIS — E113512 Type 2 diabetes mellitus with proliferative diabetic retinopathy with macular edema, left eye: Secondary | ICD-10-CM | POA: Diagnosis not present

## 2018-04-24 ENCOUNTER — Telehealth: Payer: Self-pay | Admitting: Student

## 2018-04-24 ENCOUNTER — Other Ambulatory Visit: Payer: Self-pay | Admitting: Student

## 2018-04-24 DIAGNOSIS — I10 Essential (primary) hypertension: Secondary | ICD-10-CM

## 2018-04-24 NOTE — Telephone Encounter (Signed)
Pt called and wanted to check on the status of his results from his latest blood work. He said he has also not heard from the ENT. Pt would like Dr Cyndia Skeeters to contact him.

## 2018-04-25 NOTE — Telephone Encounter (Signed)
Attempted to call patient on his work phone [phone].  Someone with a male voice picked up the phone and said wrong number. Then attempted to call home phone but no answer or voice mail. Attempted to call emergency contact but she said she doesn't have his cell phone. She said she won't see him until next week either.  Patient has a lump on his neck concerning for lymphoma.  I have already talked to him about his blood tests which is concerning. He was referred to ENT but seems like he didn't show up (see under referral in chart review). Please advise him to call [facility] ENT to see if they can reschedule him.

## 2018-05-01 LAB — HM DIABETES EYE EXAM

## 2018-05-08 DIAGNOSIS — R6889 Other general symptoms and signs: Secondary | ICD-10-CM | POA: Diagnosis not present

## 2018-05-09 ENCOUNTER — Encounter: Payer: Self-pay | Admitting: *Deleted

## 2018-05-09 NOTE — Telephone Encounter (Signed)
Letter created and routed to admin to mail. Brannen Koppen, Salome Spotted, CMA

## 2018-05-09 NOTE — Telephone Encounter (Signed)
Attempted to call, no answer and no machine.    Dr. Cyndia Skeeters,  Would you like to mail a letter?  Fleeger, Salome Spotted, CMA

## 2018-05-09 NOTE — Telephone Encounter (Signed)
Yes please. Also,would you put ENT's number on there so he could call to make an appointment. If would be nice if he could call us back and provide a number that we can use to get in touch with him easily.

## 2018-05-10 ENCOUNTER — Ambulatory Visit: Payer: Medicare HMO | Admitting: Podiatry

## 2018-05-10 VITALS — BP 124/66 | HR 74

## 2018-05-10 DIAGNOSIS — M2012 Hallux valgus (acquired), left foot: Secondary | ICD-10-CM

## 2018-05-10 DIAGNOSIS — B351 Tinea unguium: Secondary | ICD-10-CM

## 2018-05-10 DIAGNOSIS — E1151 Type 2 diabetes mellitus with diabetic peripheral angiopathy without gangrene: Secondary | ICD-10-CM | POA: Diagnosis not present

## 2018-05-10 DIAGNOSIS — M2011 Hallux valgus (acquired), right foot: Secondary | ICD-10-CM | POA: Diagnosis not present

## 2018-05-10 DIAGNOSIS — M2042 Other hammer toe(s) (acquired), left foot: Secondary | ICD-10-CM

## 2018-05-10 DIAGNOSIS — M21612 Bunion of left foot: Secondary | ICD-10-CM | POA: Diagnosis not present

## 2018-05-10 DIAGNOSIS — M2041 Other hammer toe(s) (acquired), right foot: Secondary | ICD-10-CM

## 2018-05-10 DIAGNOSIS — E1142 Type 2 diabetes mellitus with diabetic polyneuropathy: Secondary | ICD-10-CM

## 2018-05-10 DIAGNOSIS — R6889 Other general symptoms and signs: Secondary | ICD-10-CM | POA: Diagnosis not present

## 2018-05-10 DIAGNOSIS — M21611 Bunion of right foot: Secondary | ICD-10-CM

## 2018-05-10 DIAGNOSIS — I739 Peripheral vascular disease, unspecified: Secondary | ICD-10-CM | POA: Diagnosis not present

## 2018-05-10 NOTE — Patient Instructions (Signed)

## 2018-05-13 NOTE — Progress Notes (Signed)
Subjective:  Patient ID: Alec York, male    DOB: December 20, 1950,  MRN: [id number]  Chief Complaint  Patient presents with  . Diabetes    diabetic foot exam - interested in diabetic shoes   67 y.o. male returns for diabetic foot care. Last AMBS was 355. Reports numbness and tingling in their feet. Reports cramping in legs and thighs.   Past Medical History:  Diagnosis Date  . Allergy   . Diabetes mellitus without complication ([place])   . Diabetic retinopathy ([place])    Disabled 2/2 retinopathy  . GERD (gastroesophageal reflux disease)   . Hyperlipidemia   . Hypertension    No past surgical history on file.  Current Outpatient Medications:  .  aspirin 81 MG tablet, Take 81 mg by mouth daily., Disp: , Rfl:  .  atorvastatin (LIPITOR) 40 MG tablet, Take 1 tablet (40 mg total) daily by mouth., Disp: 90 tablet, Rfl: 3 .  blood glucose meter kit and supplies KIT, Dispense based on patient and insurance preference. Use up to four times daily as directed. Dx E11.319, Disp: 1 each, Rfl: 0 .  gabapentin (NEURONTIN) 300 MG capsule, Take 2 capsules (600 mg total) at bedtime by mouth AND 1 capsule (300 mg total) daily with breakfast AND 1 capsule (300 mg total) daily after lunch. You may take every 8 hours.., Disp: 360 capsule, Rfl: 3 .  glucagon 1 MG injection, Inject 1 mg into the muscle once as needed. May repeat in 15 minutes if needed., Disp: 1 each, Rfl: 12 .  glucose blood (ACCU-CHEK AVIVA PLUS) test strip, Use up to 4 times daily to check blood sugar ICD 10 code E11.319, Disp: 400 each, Rfl: 3 .  Insulin Glargine (LANTUS) 100 UNIT/ML Solostar Pen, Inject 50 Units into the skin every morning., Disp: 15 mL, Rfl: 11 .  Lancet Devices (ACCU-CHEK SOFTCLIX) lancets, Use as instructed up to 4 times daily.  Dx E11.319, Disp: 400 each, Rfl: 3 .  liraglutide (VICTOZA) 18 MG/3ML SOPN, Inject 0.3 mLs (1.8 mg total) into the skin daily., Disp: 9 mL, Rfl: 3 .  lisinopril (PRINIVIL,ZESTRIL) 5 MG tablet,  TAKE 1 TABLET BY MOUTH ONCE DAILY, Disp: 90 tablet, Rfl: 0 .  metFORMIN (GLUCOPHAGE) 1000 MG tablet, Take 1 tablet (1,000 mg total) by mouth 2 (two) times daily with a meal., Disp: 180 tablet, Rfl: 3 .  NOVOLOG FLEXPEN 100 UNIT/ML FlexPen, Inject 10 Units into the skin 3 (three) times daily with meals., Disp: 15 mL, Rfl: 11 .  RELION PEN NEEDLES 31G X 6 MM MISC, USE AS DIRECTED TO  CHECK  BLOOD  SUGAR, Disp: 50 each, Rfl: 23  No Known Allergies   Objective:   Vitals:   05/10/18 0857  BP: 124/66  Pulse: 74   General AA&O x3. Normal mood and affect.  Vascular Dorsalis pedis pulses present 1+ bilaterally  Posterior tibial pulses absent bilaterally  Capillary refill normal to all digits. Pedal hair growth diminished.  Neurologic Epicritic sensation present bilaterally. Protective sensation with 5.07 monofilament  absent bilaterally. Vibratory sensation present bilaterally.  Dermatologic No open lesions. Interspaces clear of maceration.  Normal skin temperature and turgor. Hyperkeratotic lesions: none bilaterally. Nails: discoloration dark brown, irregular, wavy nails, thickening  Orthopedic: No history of amputation. MMT 5/5 in dorsiflexion, plantarflexion, inversion, and eversion. Normal lower extremity joint ROM without pain or crepitus. 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  Diabetes with DPN, Onychomycosis -Educated on diabetic footcare. Diabetic risk level 2 -At risk foot care provided as below. -Would benefit from diabetic shoes due to diabetes with PAD, DPN, HAV, hammertoe  Procedure: Nail Debridement Rationale: Patient meets criteria for routine foot care due to PAD/DPN Type of Debridement: manual, sharp debridement. Instrumentation: Nail nipper, rotary burr. Number of Nails: 10  Return in about 3 months (around 08/10/2018) for Diabetic Foot Care.

## 2018-05-16 DIAGNOSIS — C8581 Other specified types of non-Hodgkin lymphoma, lymph nodes of head, face, and neck: Secondary | ICD-10-CM | POA: Diagnosis not present

## 2018-05-16 DIAGNOSIS — C8511 Unspecified B-cell lymphoma, lymph nodes of head, face, and neck: Secondary | ICD-10-CM | POA: Diagnosis not present

## 2018-05-16 DIAGNOSIS — R221 Localized swelling, mass and lump, neck: Secondary | ICD-10-CM | POA: Diagnosis not present

## 2018-05-17 ENCOUNTER — Encounter: Payer: Self-pay | Admitting: Student

## 2018-05-17 DIAGNOSIS — C8581 Other specified types of non-Hodgkin lymphoma, lymph nodes of head, face, and neck: Secondary | ICD-10-CM | POA: Diagnosis not present

## 2018-05-17 DIAGNOSIS — R221 Localized swelling, mass and lump, neck: Secondary | ICD-10-CM | POA: Diagnosis not present

## 2018-05-17 NOTE — Progress Notes (Signed)
Statement for therapeutic shoe signed and faxed to [phone]. Office note from 03/22/2018 attached.

## 2018-05-18 ENCOUNTER — Encounter: Payer: Self-pay | Admitting: Student

## 2018-05-31 DIAGNOSIS — C8591 Non-Hodgkin lymphoma, unspecified, lymph nodes of head, face, and neck: Secondary | ICD-10-CM | POA: Insufficient documentation

## 2018-05-31 DIAGNOSIS — C8581 Other specified types of non-Hodgkin lymphoma, lymph nodes of head, face, and neck: Secondary | ICD-10-CM | POA: Diagnosis not present

## 2018-06-04 ENCOUNTER — Telehealth: Payer: Self-pay | Admitting: Hematology

## 2018-06-04 NOTE — Telephone Encounter (Signed)
New patient appt has been scheduled for the pt to see Dr. Irene Limbo on 7/17 at 2pm. Pt aware to arrive 30 minutes early. Voiced understanding.

## 2018-06-05 NOTE — Progress Notes (Signed)
HEMATOLOGY/ONCOLOGY CONSULTATION NOTE  Date of Service: 06/06/2018  Patient Care Team: Bonnita Hollow, MD as PCP - General (Family Medicine)  Dr. Wendee Beavers as PCP at [place]:  Non-Hodgkin's Lymphoma   HISTORY OF PRESENTING ILLNESS:   Alec York is a wonderful 67 y.o. male who has been referred to [place] by ENT Dr. Melissa Montane for evaluation and management of Non-Hodgkin's Lymphoma. He is accompanied today by his wife. The pt reports that he is doing well overall.   The pt had a Fine needle aspiration of the left neck mass on 05/16/18 which confirmed a B-cell Non-Hodgkin's lymphoma.   The pt reports well controlled DM and HTN. He denies neuropathy in his hands but this sometimes occurs in his legs. He also endorses vision changes related to his DM and he takes Lantus and Metformin. He denies heart or kidney problems, or previous surgeries. He notes that his PCP Dr. Wendee Beavers manages his DM.   He first noticed the right neck mass about 3 months ago and describes that the appearance was sudden. He denies any other lumps or bumps, fevers, chills, night sweats, unexpected weight loss, and change in appetite.   Of note prior to the patient's visit today, pt has had US Soft Tissue Head/Neck completed on 03/23/18 with results revealing Multiple well-circumscribed neck lesions are identified, consistent with lymph nodes, the largest of which measures 2.1 x 1.5 x 2.3 cm. Benign behavior is not established. Bulky adenopathy such as this could relate to lymphoma, or metastatic squamous cell carcinoma. There is no visible extranodal spread of tumor.   Most recent lab results (03/22/18) of CBC w/diff is as follows: all values are WNL except for WBC at 12.8k, HGB at 12.3, Lymphs abs at 6.3k.  On review of systems, pt reports left neck mass, eating well, and denies fevers, chills, night sweats, unexpected weight loss, noticing any  other lumps or bumps, pain along the spine, abdominal pains, leg swelling, testicular pain or swelling, skin rashes, and any other symptoms.   On Social Hx the pt reports that he quit smoking cigarettes 10 years ago, and had been smoking 5-6 cigarettes each day. He denies drinking any ETOH.  On Family Hx the pt denies cancer.   MEDICAL HISTORY:  Past Medical History:  Diagnosis Date  . Allergy   . Diabetes mellitus without complication ([place])   . Diabetic retinopathy ([place])    Disabled 2/2 retinopathy  . GERD (gastroesophageal reflux disease)   . Hyperlipidemia   . Hypertension     SURGICAL HISTORY: No past surgical history on file.  SOCIAL HISTORY: Social History   Socioeconomic History  . Marital status: Married    Spouse name: Not on file  . Number of children: Not on file  . Years of education: Not on file  . Highest education level: Not on file  Occupational History  . Not on file  Social Needs  . Financial resource strain: Not on file  . Food insecurity:    Worry: Not on file    Inability: Not on file  . Transportation needs:    Medical: Not on file    Non-medical: Not on file  Tobacco Use  . Smoking status: Former Research scientist (life sciences)  . Smokeless tobacco: Never Used  Substance and Sexual Activity  . Alcohol use: No    Alcohol/week: 0.0 oz  . Drug use: No  . Sexual activity: Yes  Birth control/protection: None  Lifestyle  . Physical activity:    Days per week: Not on file    Minutes per session: Not on file  . Stress: Not on file  Relationships  . Social connections:    Talks on phone: Not on file    Gets together: Not on file    Attends religious service: Not on file    Active member of club or organization: Not on file    Attends meetings of clubs or organizations: Not on file    Relationship status: Not on file  . Intimate partner violence:    Fear of current or ex partner: Not on file    Emotionally abused: Not on file    Physically abused: Not on file     Forced sexual activity: Not on file  Other Topics Concern  . Not on file  Social History Narrative  . Not on file    FAMILY HISTORY: Family History  Problem Relation Age of Onset  . Diabetes Neg Hx     ALLERGIES:  has No Known Allergies.  MEDICATIONS:  Current Outpatient Medications  Medication Sig Dispense Refill  . aspirin 81 MG tablet Take 81 mg by mouth daily.    [name] atorvastatin (LIPITOR) 40 MG tablet Take 1 tablet (40 mg total) daily by mouth. 90 tablet 3  . blood glucose meter kit and supplies KIT Dispense based on patient and insurance preference. Use up to four times daily as directed. Dx E11.319 1 each 0  . gabapentin (NEURONTIN) 300 MG capsule Take 2 capsules (600 mg total) at bedtime by mouth AND 1 capsule (300 mg total) daily with breakfast AND 1 capsule (300 mg total) daily after lunch. You may take every 8 hours.. 360 capsule 3  . glucagon 1 MG injection Inject 1 mg into the muscle once as needed. May repeat in 15 minutes if needed. 1 each 12  . glucose blood (ACCU-CHEK AVIVA PLUS) test strip Use up to 4 times daily to check blood sugar ICD 10 code E11.319 400 each 3  . Insulin Glargine (LANTUS) 100 UNIT/ML Solostar Pen Inject 50 Units into the skin every morning. 15 mL 11  . Lancet Devices (ACCU-CHEK SOFTCLIX) lancets Use as instructed up to 4 times daily.  Dx E11.319 400 each 3  . liraglutide (VICTOZA) 18 MG/3ML SOPN Inject 0.3 mLs (1.8 mg total) into the skin daily. 9 mL 3  . lisinopril (PRINIVIL,ZESTRIL) 5 MG tablet TAKE 1 TABLET BY MOUTH ONCE DAILY 90 tablet 0  . metFORMIN (GLUCOPHAGE) 1000 MG tablet Take 1 tablet (1,000 mg total) by mouth 2 (two) times daily with a meal. 180 tablet 3  . NOVOLOG FLEXPEN 100 UNIT/ML FlexPen Inject 10 Units into the skin 3 (three) times daily with meals. 15 mL 11  . RELION PEN NEEDLES 31G X 6 MM MISC USE AS DIRECTED TO  CHECK  BLOOD  SUGAR 50 each 23   No current facility-administered medications for this visit.     REVIEW OF  SYSTEMS:    10 Point review of Systems was done is negative except as noted above.  PHYSICAL EXAMINATION: ECOG PERFORMANCE STATUS: 1 - Symptomatic but completely ambulatory  . Vitals:   06/06/18 1402  BP: 123/67  Pulse: 98  Resp: 18  Temp: 98.4 F (36.9 C)  SpO2: 100%   Filed Weights   06/06/18 1402  Weight: 147 lb (66.7 kg)   .Body mass index is 22.35 kg/m.  GENERAL:alert, in no acute distress  and comfortable SKIN: no acute rashes, no significant lesions EYES: conjunctiva are pink and non-injected, sclera anicteric OROPHARYNX: MMM, no exudates, no oropharyngeal erythema or ulceration NECK: supple, no JVD LYMPH:  no palpable lymphadenopathy in the axillary or inguinal regions. (+) Left upper cervical LN is palpable, palpable right cervical mass.  LUNGS: clear to auscultation b/l with normal respiratory effort HEART: regular rate & rhythm ABDOMEN:  normoactive bowel sounds , non tender, not distended. Extremity: no pedal edema PSYCH: alert & oriented x 3 with fluent speech NEURO: no focal motor/sensory deficits  LABORATORY DATA:  I have reviewed the data as listed  . CBC Latest Ref Rng & Units 03/22/2018 02/04/2009  WBC 3.4 - 10.8 x10E3/uL 12.8(H) 8.9  Hemoglobin 13.0 - 17.7 g/dL 12.3(L) 13.6  Hematocrit 37.5 - 51.0 % 38.9 41.2  Platelets 150 - 379 x10E3/uL 235 216    . CMP Latest Ref Rng & Units 07/07/2017 06/15/2017 12/22/2016  Glucose 65 - 99 mg/dL 233(H) 421(H) 251(H)  BUN 8 - 27 mg/dL 12 12 13   Creatinine 0.76 - 1.27 mg/dL 1.01 1.16 1.21  Sodium 134 - 144 mmol/L 134 131(L) 133(L)  Potassium 3.5 - 5.2 mmol/L 5.1 5.8(H) 5.3  Chloride 96 - 106 mmol/L 93(L) 93(L) 99  CO2 20 - 29 mmol/L 23 23 22   Calcium 8.6 - 10.2 mg/dL 9.8 9.3 9.3  Total Protein 6.1 - 8.1 g/dL - - 7.4  Total Bilirubin 0.2 - 1.2 mg/dL - - 0.4  Alkaline Phos 40 - 115 U/L - - 86  AST 10 - 35 U/L - - 23  ALT 9 - 46 U/L - - 21   05/23/18 Fine Needle Aspiration Flow Cytometry:    RADIOGRAPHIC  STUDIES: I have personally reviewed the radiological images as listed and agreed with the findings in the report. No results found.  ASSESSMENT & PLAN:  67 y.o. male with  1. Newly diagnosed likely B cell Non-Hodgkin's Lymphoma PLAN -Discussed patient's most recent labs from 03/22/18, Lymphs abs at 6.3k, HGB at 12.3, PLT normal at 235k -Reviewed the 03/23/18 US Soft Tissue Head/Neck which revealed Multiple well-circumscribed neck lesions are identified, consistent with lymph nodes, the largest of which measures 2.1 x 1.5 x 2.3 cm. Benign behavior is not established. Bulky adenopathy such as this could relate to lymphoma, or metastatic squamous cell carcinoma. There is no visible extranodal spread of tumor.  -Discussed the 05/16/18 Flow cytometry results which revealed NHL B-Cell lymphoma, and discussed that this is not completely diagnostic  -Discussed my recommendation for an excisional biopsy with ENT Dr. Melissa Montane - pt much prefers to have a needle/core biopsy with IR and understands that this may not be completely diagnostic either -Will refer pt to IR for needle/core biopsy -Will order PET/CT  -Will order ECHO -Will order labs today -Discussed the importance of controlling his blood sugars well  -Will see pt back in 2 weeks   2.  Patient Active Problem List   Diagnosis Date Noted  . Lymphadenopathy 04/12/2018  . Cervical lymphadenopathy 03/22/2018  . Right leg swelling 10/19/2017  . PAD (peripheral artery disease) ([place]) 07/07/2017  . Neuropathic pain 07/07/2017  . Knee pain, chronic 06/15/2017  . Nonspecific abnormal electrocardiogram (ECG) (EKG) 09/02/2015  . Adhesive capsulitis of right shoulder 08/19/2015  . Bursitis of right shoulder 06/01/2015  . Allergic rhinitis 06/01/2015  . Acid reflux 05/13/2015  . Type 2 diabetes mellitus with diabetic retinopathy ([place]) 05/13/2015  . Essential hypertension 05/13/2015  . Hyperlipidemia associated with  type 2 diabetes mellitus ([place])  05/13/2015   F/u with PCP for mx  Labs today US guided core needle biopsy ASAP PET/CT in 5 days ECHO RTC with Dr Irene Limbo in 2 weeks with above results   All of the patients questions were answered with apparent satisfaction. The patient knows to call the clinic with any problems, questions or concerns.  The total time spent in the appt was 60 minutes and more than 50% was on counseling and direct patient cares.    Sullivan Lone MD MS AAHIVMS [facility] Hematology/Oncology Physician [facility]  (Office):       [phone] (Work cell):  [phone] (Fax):           [phone]  06/06/2018 2:55 PM  I, Baldwin [place], am acting as a Education administrator for Dr Irene Limbo.   .I have reviewed the above documentation for accuracy and completeness, and I agree with the above. Brunetta Genera MD

## 2018-06-06 ENCOUNTER — Inpatient Hospital Stay: Payer: Medicare HMO | Attending: Hematology | Admitting: Hematology

## 2018-06-06 ENCOUNTER — Telehealth: Payer: Self-pay | Admitting: Hematology

## 2018-06-06 ENCOUNTER — Inpatient Hospital Stay: Payer: Medicare HMO

## 2018-06-06 VITALS — BP 123/67 | HR 98 | Temp 98.4°F | Resp 18 | Ht 68.0 in | Wt 147.0 lb

## 2018-06-06 DIAGNOSIS — E1140 Type 2 diabetes mellitus with diabetic neuropathy, unspecified: Secondary | ICD-10-CM

## 2018-06-06 DIAGNOSIS — I10 Essential (primary) hypertension: Secondary | ICD-10-CM | POA: Insufficient documentation

## 2018-06-06 DIAGNOSIS — C8590 Non-Hodgkin lymphoma, unspecified, unspecified site: Secondary | ICD-10-CM

## 2018-06-06 DIAGNOSIS — Z87891 Personal history of nicotine dependence: Secondary | ICD-10-CM | POA: Insufficient documentation

## 2018-06-06 DIAGNOSIS — C8591 Non-Hodgkin lymphoma, unspecified, lymph nodes of head, face, and neck: Secondary | ICD-10-CM | POA: Diagnosis not present

## 2018-06-06 DIAGNOSIS — Z794 Long term (current) use of insulin: Secondary | ICD-10-CM | POA: Insufficient documentation

## 2018-06-06 LAB — CBC WITH DIFFERENTIAL/PLATELET
Basophils Absolute: 0.1 10*3/uL (ref 0.0–0.1)
Basophils Relative: 1 %
EOS PCT: 2 %
Eosinophils Absolute: 0.2 10*3/uL (ref 0.0–0.5)
HCT: 37.7 % — ABNORMAL LOW (ref 38.4–49.9)
Hemoglobin: 12.3 g/dL — ABNORMAL LOW (ref 13.0–17.1)
LYMPHS ABS: 4.5 10*3/uL — AB (ref 0.9–3.3)
Lymphocytes Relative: 37 %
MCH: 27 pg — AB (ref 27.2–33.4)
MCHC: 32.6 g/dL (ref 32.0–36.0)
MCV: 82.9 fL (ref 79.3–98.0)
MONO ABS: 1 10*3/uL — AB (ref 0.1–0.9)
Monocytes Relative: 8 %
Neutro Abs: 6.3 10*3/uL (ref 1.5–6.5)
Neutrophils Relative %: 52 %
PLATELETS: 210 10*3/uL (ref 140–400)
RBC: 4.55 MIL/uL (ref 4.20–5.82)
RDW: 14 % (ref 11.0–14.6)
WBC: 12.1 10*3/uL — ABNORMAL HIGH (ref 4.0–10.3)

## 2018-06-06 LAB — CMP (CANCER CENTER ONLY)
ALK PHOS: 168 U/L — AB (ref 38–126)
ALT: 18 U/L (ref 0–44)
ANION GAP: 7 (ref 5–15)
AST: 19 U/L (ref 15–41)
Albumin: 4 g/dL (ref 3.5–5.0)
BUN: 13 mg/dL (ref 8–23)
CALCIUM: 9.7 mg/dL (ref 8.9–10.3)
CO2: 27 mmol/L (ref 22–32)
Chloride: 99 mmol/L (ref 98–111)
Creatinine: 1.29 mg/dL — ABNORMAL HIGH (ref 0.61–1.24)
GFR, EST NON AFRICAN AMERICAN: 57 mL/min — AB (ref >60)
Glucose, Bld: 423 mg/dL — ABNORMAL HIGH (ref 70–99)
Potassium: 5.3 mmol/L — ABNORMAL HIGH (ref 3.5–5.1)
Sodium: 133 mmol/L — ABNORMAL LOW (ref 135–145)
TOTAL PROTEIN: 8 g/dL (ref 6.5–8.1)
Total Bilirubin: 0.3 mg/dL (ref 0.3–1.2)

## 2018-06-06 LAB — LACTATE DEHYDROGENASE: LDH: 161 U/L (ref 98–192)

## 2018-06-06 NOTE — Telephone Encounter (Signed)
Scheduled appt per 7/17 los - pt is aware of appt - - gave avs and calender per los.

## 2018-06-07 ENCOUNTER — Ambulatory Visit (HOSPITAL_COMMUNITY)
Admission: RE | Admit: 2018-06-07 | Discharge: 2018-06-07 | Disposition: A | Discharge status: Home / Self Care | Payer: Medicare HMO | Source: Ambulatory Visit | Attending: Hematology | Admitting: Hematology

## 2018-06-07 DIAGNOSIS — C8590 Non-Hodgkin lymphoma, unspecified, unspecified site: Secondary | ICD-10-CM | POA: Diagnosis not present

## 2018-06-07 DIAGNOSIS — E785 Hyperlipidemia, unspecified: Secondary | ICD-10-CM | POA: Insufficient documentation

## 2018-06-07 DIAGNOSIS — I10 Essential (primary) hypertension: Secondary | ICD-10-CM | POA: Diagnosis not present

## 2018-06-07 DIAGNOSIS — E119 Type 2 diabetes mellitus without complications: Secondary | ICD-10-CM | POA: Insufficient documentation

## 2018-06-07 LAB — HEPATITIS C ANTIBODY: HCV Ab: <0.1 s/co ratio (ref 0.0–0.9)

## 2018-06-07 LAB — HEPATITIS B SURFACE ANTIGEN: HEP B S AG: NEGATIVE

## 2018-06-07 LAB — HEPATITIS B CORE ANTIBODY, TOTAL: Hep B Core Total Ab: NEGATIVE

## 2018-06-07 NOTE — Progress Notes (Signed)
  Echocardiogram 2D Echocardiogram has been performed.  Darlina Sicilian M 06/07/2018, 10:02 AM

## 2018-06-08 ENCOUNTER — Other Ambulatory Visit: Payer: Self-pay

## 2018-06-08 DIAGNOSIS — E11319 Type 2 diabetes mellitus with unspecified diabetic retinopathy without macular edema: Secondary | ICD-10-CM

## 2018-06-08 MED ORDER — GLUCOSE BLOOD VI STRP: ORAL_STRIP | 3 refills | Status: DC

## 2018-06-08 MED ORDER — ACCU-CHEK SOFTCLIX LANCETS MISC: 12 refills | Status: DC

## 2018-06-11 ENCOUNTER — Other Ambulatory Visit: Payer: Self-pay

## 2018-06-11 ENCOUNTER — Encounter: Payer: Self-pay | Admitting: Family Medicine

## 2018-06-11 DIAGNOSIS — E11319 Type 2 diabetes mellitus with unspecified diabetic retinopathy without macular edema: Secondary | ICD-10-CM

## 2018-06-11 MED ORDER — ACCU-CHEK SOFTCLIX LANCETS MISC: 12 refills | Status: DC

## 2018-06-11 MED ORDER — GLUCOSE BLOOD VI STRP: ORAL_STRIP | 3 refills | Status: DC

## 2018-06-14 ENCOUNTER — Ambulatory Visit (HOSPITAL_COMMUNITY): Admission: RE | Admit: 2018-06-14 | Payer: Medicare HMO | Source: Ambulatory Visit

## 2018-06-15 ENCOUNTER — Other Ambulatory Visit: Payer: Self-pay | Admitting: Student

## 2018-06-15 ENCOUNTER — Encounter (HOSPITAL_COMMUNITY)
Admission: RE | Admit: 2018-06-15 | Discharge: 2018-06-15 | Disposition: A | Discharge status: Home / Self Care | Payer: Medicare HMO | Source: Ambulatory Visit | Attending: Hematology | Admitting: Hematology

## 2018-06-15 DIAGNOSIS — C8590 Non-Hodgkin lymphoma, unspecified, unspecified site: Secondary | ICD-10-CM | POA: Diagnosis not present

## 2018-06-15 DIAGNOSIS — J8410 Pulmonary fibrosis, unspecified: Secondary | ICD-10-CM | POA: Diagnosis not present

## 2018-06-15 DIAGNOSIS — C8332 Diffuse large B-cell lymphoma, intrathoracic lymph nodes: Secondary | ICD-10-CM | POA: Diagnosis not present

## 2018-06-15 DIAGNOSIS — C8330 Diffuse large B-cell lymphoma, unspecified site: Secondary | ICD-10-CM | POA: Diagnosis not present

## 2018-06-15 LAB — GLUCOSE, CAPILLARY: Glucose-Capillary: 427 mg/dL — ABNORMAL HIGH (ref 70–99)

## 2018-06-15 MED ORDER — FLUDEOXYGLUCOSE F - 18 (FDG) INJECTION
8.6700 | Freq: Once | INTRAVENOUS | Status: AC | PRN
  Administered 2018-06-15: 8.67 via INTRAVENOUS

## 2018-06-18 ENCOUNTER — Encounter (HOSPITAL_COMMUNITY): Payer: Self-pay

## 2018-06-18 ENCOUNTER — Ambulatory Visit (HOSPITAL_COMMUNITY)
Admission: RE | Admit: 2018-06-18 | Discharge: 2018-06-18 | Disposition: A | Discharge status: Home / Self Care | Payer: Medicare HMO | Source: Ambulatory Visit | Attending: Hematology | Admitting: Hematology

## 2018-06-18 DIAGNOSIS — C8590 Non-Hodgkin lymphoma, unspecified, unspecified site: Secondary | ICD-10-CM

## 2018-06-18 DIAGNOSIS — C8510 Unspecified B-cell lymphoma, unspecified site: Secondary | ICD-10-CM | POA: Insufficient documentation

## 2018-06-18 DIAGNOSIS — C8511 Unspecified B-cell lymphoma, lymph nodes of head, face, and neck: Secondary | ICD-10-CM | POA: Diagnosis not present

## 2018-06-18 DIAGNOSIS — R590 Localized enlarged lymph nodes: Secondary | ICD-10-CM | POA: Diagnosis not present

## 2018-06-18 LAB — GLUCOSE, CAPILLARY: GLUCOSE-CAPILLARY: 302 mg/dL — AB (ref 70–99)

## 2018-06-18 LAB — CBC
HEMATOCRIT: 40.8 % (ref 39.0–52.0)
Hemoglobin: 13.8 g/dL (ref 13.0–17.0)
MCH: 27.8 pg (ref 26.0–34.0)
MCHC: 33.8 g/dL (ref 30.0–36.0)
MCV: 82.1 fL (ref 78.0–100.0)
PLATELETS: 251 10*3/uL (ref 150–400)
RBC: 4.97 MIL/uL (ref 4.22–5.81)
RDW: 13.5 % (ref 11.5–15.5)
WBC: 13.8 10*3/uL — AB (ref 4.0–10.5)

## 2018-06-18 LAB — APTT: APTT: 24 s (ref 24–36)

## 2018-06-18 LAB — PROTIME-INR
INR: 0.88
Prothrombin Time: 11.9 seconds (ref 11.4–15.2)

## 2018-06-18 MED ORDER — LIDOCAINE-EPINEPHRINE (PF) 2 %-1:200000 IJ SOLN
INTRAMUSCULAR | Status: AC
  Filled 2018-06-18: qty 20

## 2018-06-18 MED ORDER — SODIUM CHLORIDE 0.9 % IV SOLN
INTRAVENOUS | Status: DC
  Administered 2018-06-18: 11:00:00 via INTRAVENOUS

## 2018-06-18 NOTE — Progress Notes (Signed)
Patient ID: Alec York, male   DOB: [birth year], 67 y.o.   MRN: [id number] Patient presents today for image guided right cervical lymph node biopsy.  He underwent recent biopsy of neck nodal mass by Dr. Janace Hoard which revealed non-Hodgkin's lymphoma.  Request received for additional core samples for further histological evaluation.  Details/risks of procedure, including but not limited to, internal bleeding, infection, injury to adjacent structures discussed with patient and spouse with their understanding and consent.  Patient does not wish to receive IV conscious sedation for procedure.

## 2018-06-18 NOTE — Discharge Instructions (Signed)
Needle Biopsy, Care After These instructions give you information about caring for yourself after your procedure. Your doctor may also give you more specific instructions. Call your doctor if you have any problems or questions after your procedure. Follow these instructions at home:  Rest as told by your doctor.  Take medicines only as told by your doctor.  There are many different ways to close and cover the biopsy site, including stitches (sutures), skin glue, and adhesive strips. Follow instructions from your doctor about: ? How to take care of your biopsy site. ? When and how you should change your bandage (dressing).   ? When you should remove your dressing. ? Removing whatever was used to close your biopsy site.  You may remove your dressing tomorrow.   Check your biopsy site every day for signs of infection. Watch for: ? Redness, swelling, or pain. ? Fluid, blood, or pus. Contact a doctor if:  You have a fever.  You have redness, swelling, or pain at the biopsy site, and it lasts longer than a few days.  You have fluid, blood, or pus coming from the biopsy site.  You feel sick to your stomach (nauseous).  You throw up (vomit). Get help right away if:  You are short of breath.  You have trouble breathing.  Your chest hurts.  You feel dizzy or you pass out (faint).  You have bleeding that does not stop with pressure or a bandage.  You cough up blood.  Your belly (abdomen) hurts. This information is not intended to replace advice given to you by your health care provider. Make sure you discuss any questions you have with your health care provider. Document Released: 10/20/2008 Document Revised: 04/14/2016 Document Reviewed: 11/03/2014 Elsevier Interactive Patient Education  [employer].

## 2018-06-18 NOTE — Procedures (Signed)
Pre Procedure Dx: NHL Post Procedural Dx: Same  Technically successful US guided biopsy of hypermetabolic right cervical LN  EBL: None  No immediate complications.   Ronny Bacon, MD Pager #: [phone]

## 2018-06-20 NOTE — Progress Notes (Signed)
HEMATOLOGY/ONCOLOGY CONSULTATION NOTE  Date of Service: 06/21/2018  Patient Care Team: Mercy Riding, MD as PCP - General (Family Medicine)  Melissa Montane, MD as ENT  CHIEF COMPLAINTS/PURPOSE OF CONSULTATION:  Non-Hodgkin's Lymphoma   HISTORY OF PRESENTING ILLNESS:   Alec York is a wonderful 67 y.o. male who has been referred to [place] by ENT Dr. Melissa Montane for evaluation and management of Non-Hodgkin's Lymphoma. He is accompanied today by his wife. The pt reports that he is doing well overall.   The pt had a Fine needle aspiration of the left neck mass on 05/16/18 which confirmed a B-cell Non-Hodgkin's lymphoma.   The pt reports well controlled DM and HTN. He denies neuropathy in his hands but this sometimes occurs in his legs. He also endorses vision changes related to his DM and he takes Lantus and Metformin. He denies heart or kidney problems, or previous surgeries. He notes that his PCP Dr. Wendee Beavers manages his DM.   He first noticed the right neck mass about 3 months ago and describes that the appearance was sudden. He denies any other lumps or bumps, fevers, chills, night sweats, unexpected weight loss, and change in appetite.   Of note prior to the patient's visit today, pt has had US Soft Tissue Head/Neck completed on 03/23/18 with results revealing Multiple well-circumscribed neck lesions are identified, consistent with lymph nodes, the largest of which measures 2.1 x 1.5 x 2.3 cm. Benign behavior is not established. Bulky adenopathy such as this could relate to lymphoma, or metastatic squamous cell carcinoma. There is no visible extranodal spread of tumor.   Most recent lab results (03/22/18) of CBC w/diff is as follows: all values are WNL except for WBC at 12.8k, HGB at 12.3, Lymphs abs at 6.3k.  On review of systems, pt reports left neck mass, eating well, and denies fevers, chills, night sweats, unexpected weight loss, noticing any other lumps or bumps, pain along the spine,  abdominal pains, leg swelling, testicular pain or swelling, skin rashes, and any other symptoms.   On Social Hx the pt reports that he quit smoking cigarettes 10 years ago, and had been smoking 5-6 cigarettes each day. He denies drinking any ETOH.  On Family Hx the pt denies cancer.   Interval History:   Alec York returns today for management and evaluation of his Non-Hodgkin's Lymphoma. The patient's last visit with [place] was on 06/06/18. He is accompanied today by his wife. The pt reports that he is doing well overall.   The pt reports that he has been doing well in the interim and has developed no new concerns or symptoms. The pt notes that he has not noticed any new enlargements or enlarging lumps or bumps. He denies any weakness, fatigue, and all constitutional symptoms.   He continues to take Lantus once a day, but notes that he takes it at night occasionally. He reports some occasional dizziness that he associates with hypoglycemia.   Of note since the patient's last visit, pt has had PET/CT completed on 06/15/18 with results revealing Enlarged and hypermetabolic lymph nodes involving the neck, axilla, subpectoral and mediastinal lymph nodes. 2. No lymphadenopathy below the diaphragm. 3. No findings for osseous lymphoma. 4. Age advanced atherosclerotic calcifications. 5. Incidental cholelithiasis and diffuse fatty infiltration of the Liver.  Lab results (06/06/18) of CBC w/diff, CMP is as follows: all values are WNL except for WBC at 12.1k, HGB at 12.3, HCT at 37.7, MCH at 27.0, Lymphs  abs at 4.5k, Monocytes abs at 1.0k, Sodium at 133, Potassium at 5.3, Glucose at 423, Creatinine at 1.29, Alk Phos at 168, GFR at 57. LDH 05/1718 was WNL at 161  On review of systems, pt reports good energy levels, occasional dizziness related to blood sugars, and denies fevers, chills, night sweats, weakness, unexpected weight loss, fatigue, and any other symptoms.   MEDICAL HISTORY:  Past Medical  History:  Diagnosis Date  . Allergy   . Diabetes mellitus without complication ([place])   . Diabetic retinopathy ([place])    Disabled 2/2 retinopathy  . GERD (gastroesophageal reflux disease)   . Hyperlipidemia   . Hypertension     SURGICAL HISTORY: No past surgical history on file.  SOCIAL HISTORY: Social History   Socioeconomic History  . Marital status: Married    Spouse name: Not on file  . Number of children: Not on file  . Years of education: Not on file  . Highest education level: Not on file  Occupational History  . Not on file  Social Needs  . Financial resource strain: Not on file  . Food insecurity:    Worry: Not on file    Inability: Not on file  . Transportation needs:    Medical: Not on file    Non-medical: Not on file  Tobacco Use  . Smoking status: Former Research scientist (life sciences)  . Smokeless tobacco: Never Used  Substance and Sexual Activity  . Alcohol use: No    Alcohol/week: 0.0 oz  . Drug use: No  . Sexual activity: Yes    Birth control/protection: None  Lifestyle  . Physical activity:    Days per week: Not on file    Minutes per session: Not on file  . Stress: Not on file  Relationships  . Social connections:    Talks on phone: Not on file    Gets together: Not on file    Attends religious service: Not on file    Active member of club or organization: Not on file    Attends meetings of clubs or organizations: Not on file    Relationship status: Not on file  . Intimate partner violence:    Fear of current or ex partner: Not on file    Emotionally abused: Not on file    Physically abused: Not on file    Forced sexual activity: Not on file  Other Topics Concern  . Not on file  Social History Narrative  . Not on file    FAMILY HISTORY: Family History  Problem Relation Age of Onset  . Diabetes Neg Hx     ALLERGIES:  has No Known Allergies.  MEDICATIONS:  Current Outpatient Medications  Medication Sig Dispense Refill  . ACCU-CHEK SOFTCLIX LANCETS  lancets Use to check blood sugar four times a day or as directed. 100 each 12  . aspirin 81 MG tablet Take 81 mg by mouth daily.    [name] atorvastatin (LIPITOR) 40 MG tablet Take 1 tablet (40 mg total) daily by mouth. 90 tablet 3  . blood glucose meter kit and supplies KIT Dispense based on patient and insurance preference. Use up to four times daily as directed. Dx E11.319 1 each 0  . gabapentin (NEURONTIN) 300 MG capsule Take 2 capsules (600 mg total) at bedtime by mouth AND 1 capsule (300 mg total) daily with breakfast AND 1 capsule (300 mg total) daily after lunch. You may take every 8 hours.. 360 capsule 3  . glucagon 1 MG injection Inject  1 mg into the muscle once as needed. May repeat in 15 minutes if needed. 1 each 12  . glucose blood (ACCU-CHEK AVIVA PLUS) test strip Use up to 4 times daily to check blood sugar ICD 10 code E11.319 100 each 3  . Insulin Glargine (LANTUS) 100 UNIT/ML Solostar Pen Inject 50 Units into the skin every morning. 15 mL 11  . Lancet Devices (ACCU-CHEK SOFTCLIX) lancets Use as instructed up to 4 times daily.  Dx E11.319 400 each 3  . liraglutide (VICTOZA) 18 MG/3ML SOPN Inject 0.3 mLs (1.8 mg total) into the skin daily. 9 mL 3  . lisinopril (PRINIVIL,ZESTRIL) 5 MG tablet TAKE 1 TABLET BY MOUTH ONCE DAILY 90 tablet 0  . metFORMIN (GLUCOPHAGE) 1000 MG tablet Take 1 tablet (1,000 mg total) by mouth 2 (two) times daily with a meal. 180 tablet 3  . NOVOLOG FLEXPEN 100 UNIT/ML FlexPen Inject 10 Units into the skin 3 (three) times daily with meals. 15 mL 11  . RELION PEN NEEDLES 31G X 6 MM MISC USE AS DIRECTED TO  CHECK  BLOOD  SUGAR 50 each 23   No current facility-administered medications for this visit.     REVIEW OF SYSTEMS:    A 10+ POINT REVIEW OF SYSTEMS WAS OBTAINED including neurology, dermatology, psychiatry, cardiac, respiratory, lymph, extremities, GI, GU, Musculoskeletal, constitutional, breasts, reproductive, HEENT.  All pertinent positives are noted in the  HPI.  All others are negative.   PHYSICAL EXAMINATION: ECOG PERFORMANCE STATUS: 1 - Symptomatic but completely ambulatory  Vitals:   06/21/18 0846  BP: 110/76  Pulse: 79  Resp: 18  Temp: 97.9 F (36.6 C)  SpO2: 100%   Filed Weights   06/21/18 0846  Weight: 141 lb (64 kg)   .Body mass index is 21.44 kg/m.  GENERAL:alert, in no acute distress and comfortable SKIN: no acute rashes, no significant lesions EYES: conjunctiva are pink and non-injected, sclera anicteric OROPHARYNX: MMM, no exudates, no oropharyngeal erythema or ulceration NECK: supple, no JVD LYMPH:  no palpable lymphadenopathy in the axillary or inguinal regions (+) Left cervical LN is palpable, palpable right cervical mass LUNGS: clear to auscultation b/l with normal respiratory effort HEART: regular rate & rhythm ABDOMEN:  normoactive bowel sounds , non tender, not distended. No palpable hepatosplenomegaly.  Extremity: no pedal edema PSYCH: alert & oriented x 3 with fluent speech NEURO: no focal motor/sensory deficits   LABORATORY DATA:  I have reviewed the data as listed  . CBC Latest Ref Rng & Units 06/18/2018 06/06/2018 03/22/2018  WBC 4.0 - 10.5 K/uL 13.8(H) 12.1(H) 12.8(H)  Hemoglobin 13.0 - 17.0 g/dL 13.8 12.3(L) 12.3(L)  Hematocrit 39.0 - 52.0 % 40.8 37.7(L) 38.9  Platelets 150 - 400 K/uL 251 210 235    . CMP Latest Ref Rng & Units 06/06/2018 07/07/2017 06/15/2017  Glucose 70 - 99 mg/dL 423(H) 233(H) 421(H)  BUN 8 - 23 mg/dL _0 Creatinine 0.61 - 1.24 mg/dL 1.29(H) 1.01 1.16  Sodium 135 - 145 mmol/L 133(L) 134 131(L)  Potassium 3.5 - 5.1 mmol/L 5.3(H) 5.1 5.8(H)  Chloride 98 - 111 mmol/L 99 93(L) 93(L)  CO2 22 - 32 mmol/L _1 Calcium 8.9 - 10.3 mg/dL 9.7 9.8 9.3  Total Protein 6.5 - 8.1 g/dL 8.0 - -  Total Bilirubin 0.3 - 1.2 mg/dL 0.3 - -  Alkaline Phos 38 - 126 U/L 168(H) - -  AST 15 - 41 U/L 19 - -  ALT 0 - 44 U/L  18 - -   06/18/18 Right Cervical LN Needle/core  Biopsy:    05/23/18 Fine Needle Aspiration Flow Cytometry:    RADIOGRAPHIC STUDIES: I have personally reviewed the radiological images as listed and agreed with the findings in the report. 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 EXAM: NUCLEAR MEDICINE PET SKULL BASE TO THIGH TECHNIQUE: 8.67 mCi F-18 FDG was injected intravenously. Full-ring PET imaging was performed from the skull base to thigh after the radiotracer. CT data was obtained and used for attenuation correction and anatomic localization. Fasting blood glucose: 427 mg/dl COMPARISON:  None. FINDINGS: Mediastinal blood pool activity: SUV max 3.0 NECK: Hypermetabolic lymphadenopathy is demonstrated. 17 mm short axis level 5 lymph node on the right is hypermetabolic with SUV max of 5.4. Small scattered level 2 a and 2 B lymph nodes bilaterally. Index node on the left on image 25 measures 9 mm and SUV max is 3.5. Right-sided supraclavicular node on image 41 measures 12.5 mm and SUV max is 4.16. Incidental CT findings: none CHEST: Bilateral axillary lymphadenopathy. Index node in the right axilla on image number 52 measures 29.5 x 12.5 mm and SUV max is 2.8. Index node in the left axilla on image number 55 measures 10.5 mm and SUV max is 3.27. 10.5 mm left subpectoral lymph node on image number 49 has an SUV max of 2.83. Small scattered mediastinal and hilar lymph nodes. Subcarinal lymph node on image number 70 measures 10 mm and SUV max is 3.65. No worrisome pulmonary lesions. Incidental CT findings: Calcified granuloma in the right lower lobe. Age advanced atherosclerotic calcifications involving the coronary arteries. ABDOMEN/PELVIS: No abnormal hypermetabolic activity within the liver, pancreas, adrenal glands, or spleen. No hypermetabolic lymph nodes in the abdomen or pelvis. Small scattered inguinal lymph nodes but no hypermetabolism. Incidental  CT findings: Age advanced atherosclerotic calcifications involving the aorta and iliac arteries. Cholelithiasis. Diffuse fatty infiltration of the liver. Mild mesenteric interstitial changes and small lymph nodes which are not hypermetabolic. This is most likely benign mesenteritis or panniculitis. Advanced vascular calcifications in noted in the upper thighs bilaterally. SKELETON: No significant bony findings. No areas of hypermetabolism to suggest osseous lymphoma. Incidental CT findings: none IMPRESSION: 1. Enlarged and hypermetabolic lymph nodes involving the neck, axilla, subpectoral and mediastinal lymph nodes. 2. No lymphadenopathy below the diaphragm. 3. No findings for osseous lymphoma. 4. Age advanced atherosclerotic calcifications. 5. Incidental cholelithiasis and diffuse fatty infiltration of the liver. Electronically Signed   By: Marijo Sanes M.D.   On: 06/15/2018 13:10   [place] Core Biopsy (lymph Nodes)  Result Date: 06/18/2018 INDICATION: Recent diagnosis of non-Hodgkin's lymphoma. Please perform ultrasound-guided biopsy of dominant hypermetabolic right cervical lymph node for tissue diagnostic purposes. EXAM: ULTRASOUND-GUIDED RIGHT CERVICAL LYMPH NODE BIOPSY COMPARISON:  PET-CT - 06/15/2018 MEDICATIONS: None ANESTHESIA/SEDATION: None COMPLICATIONS: None immediate. TECHNIQUE: Informed written consent was obtained from the patient after a discussion of the risks, benefits and alternatives to treatment. Questions regarding the procedure were encouraged and answered. Initial ultrasound scanning demonstrated enlarged (approximately 2.5 x 1.3 cm hypoechoic lymph node correlating with the dominant hypermetabolic lymph node seen within the right mid lateral neck on preceding PET-CT image 20, series 4. an ultrasound image was saved for documentation purposes. The procedure was planned. A timeout was performed prior to the initiation of the procedure. The operative was prepped and draped in the usual sterile  fashion, and a sterile  drape was applied covering the operative field. A timeout was performed prior to the initiation of the procedure. Local anesthesia was provided with 1% lidocaine with epinephrine. Under direct ultrasound guidance, an 18 gauge core needle device was utilized to obtain to obtain 6 core needle biopsies of the dominant right mid lateral cervical lymph node. The samples were placed in saline and submitted to pathology. The needle was removed and hemostasis was achieved with manual compression. Post procedure scan was negative for significant hematoma. A dressing was placed. The patient tolerated the procedure well without immediate postprocedural complication. IMPRESSION: Technically successful ultrasound guided biopsy of dominant right mid lateral cervical lymph node. Electronically Signed   By: Sandi Mariscal M.D.   On: 06/18/2018 14:47    ASSESSMENT & PLAN:  67 y.o. male with  1. 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 Benign behavior is not established. Bulky adenopathy such as this could relate to lymphoma, or metastatic squamous cell carcinoma. There is no visible extranodal spread of tumor.   PLAN: -Discussed pt labwork from , 06/06/18; HGB at 12.3, Lymphs abs decreased to 4.5k, Glucose was at 423 -Discussed the 06/18/18 biopsy which favored a low grade Marginal Zone Lymphoma -Discussed the 06/15/18 PET/CT which revealed Enlarged and hypermetabolic lymph nodes involving the neck, axilla, subpectoral and mediastinal lymph nodes. No lymphadenopathy below the diaphragm. No findings for osseous lymphoma -Discussed the  staging indicated by the 06/15/18 PET/CT as Stage II -06/07/18 ECHO for treatment planning revealed a normal ejection fraction -Reviewed that NCCN guidelines for Stage II Marginal Zone Lymphoma with pt and his wife, chemotherapy not currently recommended, recommended either observation of Rituxan -Pt prefers to pursue observation at this time, he understand the constitutional symptoms to monitor for and will call me if any new concerns arise  -Recommended observing the pt again in 3 months, pt noted he will be travelling to [place] at that time, he will follow up with physician in [place], and will be returning to my care in 6 months which I discussed is not recommended and the pt understands but will be keeping his current travel plans -Discussed the constitutional symptoms that we will continue to monitor the pt for, none of which he currently exhibits -Recommended that the pt prioritize his diabetes management with his PCP Dr. Wendee Beavers, checking his blood sugars more often, and staying well hydrated -Recommend taking Lantus each morning, at the same time of day, instead of at night  -Recommend staying up to date with annual flu vaccines and both pneumonia vaccines every 5 years  2.  Patient Active Problem List   Diagnosis Date Noted  . Lymphadenopathy 04/12/2018  . Cervical lymphadenopathy 03/22/2018  . Right leg swelling 10/19/2017  . PAD (peripheral artery disease) ([place]) 07/07/2017  . Neuropathic pain 07/07/2017  . Knee pain, chronic 06/15/2017  . Nonspecific abnormal electrocardiogram (ECG) (EKG) 09/02/2015  . Adhesive capsulitis of right shoulder 08/19/2015  . Bursitis of right shoulder 06/01/2015  . Allergic rhinitis 06/01/2015  . Acid reflux 05/13/2015  . Type 2 diabetes mellitus with diabetic retinopathy ([place]) 05/13/2015  . Essential hypertension 05/13/2015  . Hyperlipidemia associated with type 2 diabetes mellitus ([place])  05/13/2015   F/u with PCP for mx   RTC with Dr Irene Limbo in 6  months with labs   All of the patients questions were answered with apparent satisfaction. The patient knows to call the clinic with any problems, questions or concerns.  The total time spent in the appt was 30 minutes and more than 50% was on counseling and direct patient cares.      Sullivan Lone MD MS AAHIVMS [facility] Hematology/Oncology Physician [facility]  (Office):       [phone] (Work cell):  [phone] (Fax):           [phone]  06/21/2018 9:22 AM  I, Baldwin [place], am acting as a scribe for Dr. Irene Limbo  .I have reviewed the above documentation for accuracy and completeness, and I agree with the above. Brunetta Genera MD

## 2018-06-21 ENCOUNTER — Encounter: Payer: Self-pay | Admitting: Hematology

## 2018-06-21 ENCOUNTER — Inpatient Hospital Stay: Payer: Medicare HMO | Attending: Hematology | Admitting: Hematology

## 2018-06-21 ENCOUNTER — Telehealth: Payer: Self-pay

## 2018-06-21 VITALS — BP 110/76 | HR 79 | Temp 97.9°F | Resp 18 | Ht 68.0 in | Wt 141.0 lb

## 2018-06-21 DIAGNOSIS — Z87891 Personal history of nicotine dependence: Secondary | ICD-10-CM | POA: Diagnosis not present

## 2018-06-21 DIAGNOSIS — C8590 Non-Hodgkin lymphoma, unspecified, unspecified site: Secondary | ICD-10-CM

## 2018-06-21 DIAGNOSIS — C8591 Non-Hodgkin lymphoma, unspecified, lymph nodes of head, face, and neck: Secondary | ICD-10-CM

## 2018-06-21 DIAGNOSIS — I10 Essential (primary) hypertension: Secondary | ICD-10-CM | POA: Diagnosis not present

## 2018-06-21 DIAGNOSIS — E1140 Type 2 diabetes mellitus with diabetic neuropathy, unspecified: Secondary | ICD-10-CM

## 2018-06-21 NOTE — Telephone Encounter (Signed)
Spoke with patient concerning his upcoming appointment. Per 8/1los. patient stated that he was going out of [place] and to [place] for [place] 1st week of April. Spoke with Irene Limbo and he is aware of it and okayed the later appointment date, for when the patient returns. Recalled the patient and set up the time that was convient for him. Also told him that [place] stated if he needed to see him before he left to schedule him a appointment.

## 2018-06-25 ENCOUNTER — Ambulatory Visit (INDEPENDENT_AMBULATORY_CARE_PROVIDER_SITE_OTHER): Payer: Medicare HMO | Admitting: Orthotics

## 2018-06-25 DIAGNOSIS — M2041 Other hammer toe(s) (acquired), right foot: Secondary | ICD-10-CM

## 2018-06-25 DIAGNOSIS — M21612 Bunion of left foot: Secondary | ICD-10-CM

## 2018-06-25 DIAGNOSIS — I739 Peripheral vascular disease, unspecified: Secondary | ICD-10-CM

## 2018-06-25 DIAGNOSIS — M2012 Hallux valgus (acquired), left foot: Secondary | ICD-10-CM | POA: Diagnosis not present

## 2018-06-25 DIAGNOSIS — E1151 Type 2 diabetes mellitus with diabetic peripheral angiopathy without gangrene: Secondary | ICD-10-CM

## 2018-06-25 DIAGNOSIS — E1142 Type 2 diabetes mellitus with diabetic polyneuropathy: Secondary | ICD-10-CM

## 2018-06-25 DIAGNOSIS — M2042 Other hammer toe(s) (acquired), left foot: Secondary | ICD-10-CM

## 2018-06-25 NOTE — Progress Notes (Signed)

## 2018-07-30 ENCOUNTER — Other Ambulatory Visit: Payer: Self-pay

## 2018-07-30 DIAGNOSIS — E11319 Type 2 diabetes mellitus with unspecified diabetic retinopathy without macular edema: Secondary | ICD-10-CM

## 2018-07-30 DIAGNOSIS — I10 Essential (primary) hypertension: Secondary | ICD-10-CM

## 2018-07-31 MED ORDER — LISINOPRIL 5 MG PO TABS: 5.0000 mg | ORAL_TABLET | Freq: Every day | ORAL | 3 refills | Status: DC

## 2018-07-31 MED ORDER — METFORMIN HCL 1000 MG PO TABS: 1000.0000 mg | ORAL_TABLET | Freq: Two times a day (BID) | ORAL | 3 refills | Status: DC

## 2018-12-20 ENCOUNTER — Ambulatory Visit: Payer: Medicare HMO | Admitting: Hematology

## 2018-12-20 ENCOUNTER — Other Ambulatory Visit: Payer: Medicare HMO

## 2019-02-20 ENCOUNTER — Telehealth: Payer: Self-pay | Admitting: *Deleted

## 2019-02-20 NOTE — Telephone Encounter (Signed)
Attempted to contact patient per [facility] current rescheduling guidelines.  Dr.Kale would like to reschedule appt for 4/2 and see in 2 months months.  Home number not in service. Cell number was answered - asked for patient - informed it was not his number.

## 2019-02-21 ENCOUNTER — Telehealth: Payer: Self-pay | Admitting: Hematology

## 2019-02-21 ENCOUNTER — Other Ambulatory Visit: Payer: Self-pay

## 2019-02-21 ENCOUNTER — Inpatient Hospital Stay: Payer: Medicare HMO | Attending: Hematology

## 2019-02-21 ENCOUNTER — Inpatient Hospital Stay (HOSPITAL_BASED_OUTPATIENT_CLINIC_OR_DEPARTMENT_OTHER): Payer: Medicare HMO | Admitting: Hematology

## 2019-02-21 ENCOUNTER — Telehealth: Payer: Self-pay | Admitting: *Deleted

## 2019-02-21 VITALS — BP 138/70 | HR 100 | Temp 97.8°F | Resp 18 | Ht 68.0 in | Wt 131.7 lb

## 2019-02-21 DIAGNOSIS — E119 Type 2 diabetes mellitus without complications: Secondary | ICD-10-CM

## 2019-02-21 DIAGNOSIS — Z794 Long term (current) use of insulin: Secondary | ICD-10-CM | POA: Insufficient documentation

## 2019-02-21 DIAGNOSIS — C8511 Unspecified B-cell lymphoma, lymph nodes of head, face, and neck: Secondary | ICD-10-CM

## 2019-02-21 DIAGNOSIS — I10 Essential (primary) hypertension: Secondary | ICD-10-CM | POA: Diagnosis not present

## 2019-02-21 DIAGNOSIS — C8590 Non-Hodgkin lymphoma, unspecified, unspecified site: Secondary | ICD-10-CM

## 2019-02-21 DIAGNOSIS — Z87891 Personal history of nicotine dependence: Secondary | ICD-10-CM

## 2019-02-21 DIAGNOSIS — C9110 Chronic lymphocytic leukemia of B-cell type not having achieved remission: Secondary | ICD-10-CM

## 2019-02-21 LAB — CBC WITH DIFFERENTIAL/PLATELET
Abs Immature Granulocytes: 0.05 10*3/uL (ref 0.00–0.07)
Basophils Absolute: 0.1 10*3/uL (ref 0.0–0.1)
Basophils Relative: 0 %
Eosinophils Absolute: 0.2 10*3/uL (ref 0.0–0.5)
Eosinophils Relative: 1 %
HCT: 38.8 % — ABNORMAL LOW (ref 39.0–52.0)
Hemoglobin: 12.2 g/dL — ABNORMAL LOW (ref 13.0–17.0)
Immature Granulocytes: 0 %
Lymphocytes Relative: 55 %
Lymphs Abs: 10 10*3/uL — ABNORMAL HIGH (ref 0.7–4.0)
MCH: 25.5 pg — ABNORMAL LOW (ref 26.0–34.0)
MCHC: 31.4 g/dL (ref 30.0–36.0)
MCV: 81.2 fL (ref 80.0–100.0)
Monocytes Absolute: 1 10*3/uL (ref 0.1–1.0)
Monocytes Relative: 6 %
Neutro Abs: 6.9 10*3/uL (ref 1.7–7.7)
Neutrophils Relative %: 38 %
Platelets: 233 10*3/uL (ref 150–400)
RBC: 4.78 MIL/uL (ref 4.22–5.81)
RDW: 15.4 % (ref 11.5–15.5)
WBC: 18.3 10*3/uL — ABNORMAL HIGH (ref 4.0–10.5)
nRBC: 0 % (ref 0.0–0.2)

## 2019-02-21 LAB — CMP (CANCER CENTER ONLY)
ALT: 13 U/L (ref 0–44)
AST: 16 U/L (ref 15–41)
Albumin: 3.9 g/dL (ref 3.5–5.0)
Alkaline Phosphatase: 142 U/L — ABNORMAL HIGH (ref 38–126)
Anion gap: 10 (ref 5–15)
BUN: 14 mg/dL (ref 8–23)
CO2: 25 mmol/L (ref 22–32)
Calcium: 9.9 mg/dL (ref 8.9–10.3)
Chloride: 94 mmol/L — ABNORMAL LOW (ref 98–111)
Creatinine: 1.27 mg/dL — ABNORMAL HIGH (ref 0.61–1.24)
GFR, Est AFR Am: >60 mL/min (ref >60)
GFR, Estimated: 59 mL/min — ABNORMAL LOW (ref >60)
Glucose, Bld: 493 mg/dL — ABNORMAL HIGH (ref 70–99)
Potassium: 5.2 mmol/L — ABNORMAL HIGH (ref 3.5–5.1)
Sodium: 129 mmol/L — ABNORMAL LOW (ref 135–145)
Total Bilirubin: 0.4 mg/dL (ref 0.3–1.2)
Total Protein: 8 g/dL (ref 6.5–8.1)

## 2019-02-21 LAB — LACTATE DEHYDROGENASE: LDH: 174 U/L (ref 98–192)

## 2019-02-21 NOTE — Progress Notes (Signed)
HEMATOLOGY/ONCOLOGY CLNIC NOTE  Date of Service: 02/21/2019  Patient Care Team: Sherene Sires, DO as PCP - General (Family Medicine)  Melissa Montane, MD as ENT  CHIEF COMPLAINTS/PURPOSE OF CONSULTATION:  Non-Hodgkin's Lymphoma   HISTORY OF PRESENTING ILLNESS:   Alec York is a wonderful 68 y.o. male who has been referred to [place] by ENT Dr. Melissa Montane for evaluation and management of Non-Hodgkin's Lymphoma. He is accompanied today by his wife. The pt reports that he is doing well overall.   The pt had a Fine needle aspiration of the left neck mass on 05/16/18 which confirmed a B-cell Non-Hodgkin's lymphoma.   The pt reports well controlled DM and HTN. He denies neuropathy in his hands but this sometimes occurs in his legs. He also endorses vision changes related to his DM and he takes Lantus and Metformin. He denies heart or kidney problems, or previous surgeries. He notes that his PCP Dr. Wendee Beavers manages his DM.   He first noticed the right neck mass about 3 months ago and describes that the appearance was sudden. He denies any other lumps or bumps, fevers, chills, night sweats, unexpected weight loss, and change in appetite.   Of note prior to the patient's visit today, pt has had US Soft Tissue Head/Neck completed on 03/23/18 with results revealing Multiple well-circumscribed neck lesions are identified, consistent with lymph nodes, the largest of which measures 2.1 x 1.5 x 2.3 cm. Benign behavior is not established. Bulky adenopathy such as this could relate to lymphoma, or metastatic squamous cell carcinoma. There is no visible extranodal spread of tumor.   Most recent lab results (03/22/18) of CBC w/diff is as follows: all values are WNL except for WBC at 12.8k, HGB at 12.3, Lymphs abs at 6.3k.  On review of systems, pt reports left neck mass, eating well, and denies fevers, chills, night sweats, unexpected weight loss, noticing any other lumps or bumps, pain along the spine,  abdominal pains, leg swelling, testicular pain or swelling, skin rashes, and any other symptoms.   On Social Hx the pt reports that he quit smoking cigarettes 10 years ago, and had been smoking 5-6 cigarettes each day. He denies drinking any ETOH.  On Family Hx the pt denies cancer.   Interval History:   Alec York returns today for management and evaluation of his Non-Hodgkin's Lymphoma. The patient's last visit with [place] was on 06/21/18. The pt reports that he is doing well overall.  The pt notes that he returned from [place] on 02/06/19 and denies developing any fevers, chills, cold, body aches, SOB or concerns for infection. The pt reports that his seasonal environmental allergies have been bothering him recently, and reports an occasional cough with he associates with his seasonal allergies. The pt also reports that he keeps his home temperature at 82 degrees.  Pt also had 20 teeth removed in October 2019, and has slowly been getting used to using his dentures, but notes that this was limiting for him in the interim from eating as well as his baseline. He lost 10 pounds in the last 6 months and notes that he is now eating much better and is beginning to gain back some of this weight.   Lab results today (02/21/19) of CBC w/diff and CMP is as follows: all values are WNL except for WBC at 18.3k, HGB at 12.2, HCT at 38.8, MCH at 25.5, Lymphs abs at 10k, Sodium at 129, Potassium at 5.2, Chloride at 94,  Glucose at 493, Creatinine at 1.27, Alk Phos at 142, GFR at 59. 02/21/19 LDH is normal at 174  On review of systems, pt reports good energy levels, recent dental pain, weight loss, now eating better, allergies with cough, and denies noticing any new lumps or bumps, fevers, chills, SOB, and any other symptoms.   MEDICAL HISTORY:  Past Medical History:  Diagnosis Date  . Allergy   . Diabetes mellitus without complication ([place])   . Diabetic retinopathy ([place])    Disabled 2/2 retinopathy  . GERD  (gastroesophageal reflux disease)   . Hyperlipidemia   . Hypertension     SURGICAL HISTORY: No past surgical history on file.  SOCIAL HISTORY: Social History   Socioeconomic History  . Marital status: Married    Spouse name: Not on file  . Number of children: Not on file  . Years of education: Not on file  . Highest education level: Not on file  Occupational History  . Not on file  Social Needs  . Financial resource strain: Not on file  . Food insecurity:    Worry: Not on file    Inability: Not on file  . Transportation needs:    Medical: Not on file    Non-medical: Not on file  Tobacco Use  . Smoking status: Former Research scientist (life sciences)  . Smokeless tobacco: Never Used  Substance and Sexual Activity  . Alcohol use: No    Alcohol/week: 0.0 standard drinks  . Drug use: No  . Sexual activity: Yes    Birth control/protection: None  Lifestyle  . Physical activity:    Days per week: Not on file    Minutes per session: Not on file  . Stress: Not on file  Relationships  . Social connections:    Talks on phone: Not on file    Gets together: Not on file    Attends religious service: Not on file    Active member of club or organization: Not on file    Attends meetings of clubs or organizations: Not on file    Relationship status: Not on file  . Intimate partner violence:    Fear of current or ex partner: Not on file    Emotionally abused: Not on file    Physically abused: Not on file    Forced sexual activity: Not on file  Other Topics Concern  . Not on file  Social History Narrative  . Not on file    FAMILY HISTORY: Family History  Problem Relation Age of Onset  . Diabetes Neg Hx     ALLERGIES:  has No Known Allergies.  MEDICATIONS:  Current Outpatient Medications  Medication Sig Dispense Refill  . ACCU-CHEK SOFTCLIX LANCETS lancets Use to check blood sugar four times a day or as directed. 100 each 12  . aspirin 81 MG tablet Take 81 mg by mouth daily.    [name]  atorvastatin (LIPITOR) 40 MG tablet Take 1 tablet (40 mg total) daily by mouth. 90 tablet 3  . blood glucose meter kit and supplies KIT Dispense based on patient and insurance preference. Use up to four times daily as directed. Dx E11.319 1 each 0  . gabapentin (NEURONTIN) 300 MG capsule Take 2 capsules (600 mg total) at bedtime by mouth AND 1 capsule (300 mg total) daily with breakfast AND 1 capsule (300 mg total) daily after lunch. You may take every 8 hours.. 360 capsule 3  . glucagon 1 MG injection Inject 1 mg into the muscle once as  needed. May repeat in 15 minutes if needed. 1 each 12  . glucose blood (ACCU-CHEK AVIVA PLUS) test strip Use up to 4 times daily to check blood sugar ICD 10 code E11.319 100 each 3  . Insulin Glargine (LANTUS) 100 UNIT/ML Solostar Pen Inject 50 Units into the skin every morning. 15 mL 11  . Lancet Devices (ACCU-CHEK SOFTCLIX) lancets Use as instructed up to 4 times daily.  Dx E11.319 400 each 3  . liraglutide (VICTOZA) 18 MG/3ML SOPN Inject 0.3 mLs (1.8 mg total) into the skin daily. 9 mL 3  . lisinopril (PRINIVIL,ZESTRIL) 5 MG tablet Take 1 tablet (5 mg total) by mouth daily. 90 tablet 3  . metFORMIN (GLUCOPHAGE) 1000 MG tablet Take 1 tablet (1,000 mg total) by mouth 2 (two) times daily with a meal. 180 tablet 3  . NOVOLOG FLEXPEN 100 UNIT/ML FlexPen Inject 10 Units into the skin 3 (three) times daily with meals. 15 mL 11  . RELION PEN NEEDLES 31G X 6 MM MISC USE AS DIRECTED TO  CHECK  BLOOD  SUGAR 50 each 23   No current facility-administered medications for this visit.     REVIEW OF SYSTEMS:    A 10+ POINT REVIEW OF SYSTEMS WAS OBTAINED including neurology, dermatology, psychiatry, cardiac, respiratory, lymph, extremities, GI, GU, Musculoskeletal, constitutional, breasts, reproductive, HEENT.  All pertinent positives are noted in the HPI.  All others are negative.    PHYSICAL EXAMINATION: ECOG PERFORMANCE STATUS: 1 - Symptomatic but completely ambulatory   Vitals:   02/21/19 0915  BP: 138/70  Pulse: 100  Resp: 18  Temp: 97.8 F (36.6 C)  SpO2: 100%   Filed Weights   02/21/19 0915  Weight: 131 lb 11.2 oz (59.7 kg)   .Body mass index is 20.02 kg/m.  GENERAL:alert, in no acute distress and comfortable SKIN: no acute rashes, no significant lesions EYES: conjunctiva are pink and non-injected, sclera anicteric OROPHARYNX: MMM, no exudates, no oropharyngeal erythema or ulceration NECK: supple, no JVD LYMPH: (+) Left cervical LN is palpable, palpable right cervical mass. No palpable lymphadenopathy in the axillary or inguinal regions LUNGS: clear to auscultation b/l with normal respiratory effort HEART: regular rate & rhythm ABDOMEN:  normoactive bowel sounds , non tender, not distended. No palpable hepatosplenomegaly.  Extremity: no pedal edema PSYCH: alert & oriented x 3 with fluent speech NEURO: no focal motor/sensory deficits   LABORATORY DATA:  I have reviewed the data as listed  . CBC Latest Ref Rng & Units 02/21/2019 06/18/2018 06/06/2018  WBC 4.0 - 10.5 K/uL 18.3(H) 13.8(H) 12.1(H)  Hemoglobin 13.0 - 17.0 g/dL 12.2(L) 13.8 12.3(L)  Hematocrit 39.0 - 52.0 % 38.8(L) 40.8 37.7(L)  Platelets 150 - 400 K/uL 233 251 210    . CMP Latest Ref Rng & Units 02/21/2019 06/06/2018 07/07/2017  Glucose 70 - 99 mg/dL 493(H) 423(H) 233(H)  BUN 8 - 23 mg/dL _0 Creatinine 0.61 - 1.24 mg/dL 1.27(H) 1.29(H) 1.01  Sodium 135 - 145 mmol/L 129(L) 133(L) 134  Potassium 3.5 - 5.1 mmol/L 5.2(H) 5.3(H) 5.1  Chloride 98 - 111 mmol/L 94(L) 99 93(L)  CO2 22 - 32 mmol/L _1 Calcium 8.9 - 10.3 mg/dL 9.9 9.7 9.8  Total Protein 6.5 - 8.1 g/dL 8.0 8.0 -  Total Bilirubin 0.3 - 1.2 mg/dL 0.4 0.3 -  Alkaline Phos 38 - 126 U/L 142(H) 168(H) -  AST 15 - 41 U/L 16 19 -  ALT 0 - 44 U/L 13 18 -  06/18/18 Right Cervical LN Needle/core Biopsy:    05/23/18 Fine Needle Aspiration Flow Cytometry:    RADIOGRAPHIC STUDIES: I have personally reviewed  the radiological images as listed and agreed with the findings in the report. No results found.  ASSESSMENT & PLAN:  68 y.o. male with  1. 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  05/16/18 Flow cytometry results which revealed NHL B-Cell lymphoma, and discussed that this is not completely diagnostic   03/23/18 US Soft Tissue Head/Neck revealed Multiple well-circumscribed neck lesions are identified, consistent with lymph nodes, the largest of which measures 2.1 x 1.5 x 2.3 cm. Benign behavior is not established. Bulky adenopathy such as this could relate to lymphoma, or metastatic squamous cell carcinoma. There is no visible extranodal spread of tumor.   06/18/18 biopsy favored a low grade Marginal Zone Lymphoma 06/15/18 PET/CT revealed Enlarged and hypermetabolic lymph nodes involving the neck, axilla, subpectoral and mediastinal lymph nodes. No lymphadenopathy below the diaphragm. No findings for osseous lymphoma  06/07/18 ECHO for treatment planning revealed a normal ejection fraction  PLAN: -Discussed pt labwork today, 02/21/19; WBC minimally increased to 18.3k with lymphs at 10k, no anemia nor thrombocytopenia. Glucose high at 493. LDH normal at 174 -Strongly recommended that the pt return to care with his PCP Dr. Wendee Beavers to discuss and optimize his DM management  -No actionable progression of his lymphoma at this time, and no indication to consider initiating treatment at this time. -Will continue watchful observation and pt is aware of the constitutional symptoms to be mindful of -Recommend staying up to date with annual flu vaccines and both pneumonia vaccines every 5 years -Will see the pt back in 4 months  2.  Patient Active Problem List   Diagnosis Date Noted  . Lymphadenopathy 04/12/2018  . Cervical lymphadenopathy 03/22/2018  . Right leg swelling 10/19/2017  . PAD (peripheral artery disease) ([place])  07/07/2017  . Neuropathic pain 07/07/2017  . Knee pain, chronic 06/15/2017  . Nonspecific abnormal electrocardiogram (ECG) (EKG) 09/02/2015  . Adhesive capsulitis of right shoulder 08/19/2015  . Bursitis of right shoulder 06/01/2015  . Allergic rhinitis 06/01/2015  . Acid reflux 05/13/2015  . Type 2 diabetes mellitus with diabetic retinopathy ([place]) 05/13/2015  . Essential hypertension 05/13/2015  . Hyperlipidemia associated with type 2 diabetes mellitus ([place]) 05/13/2015   F/u with PCP for mx esp for uncontrolled DM2   RTC with Dr Irene Limbo with labs in 4 months F/u with PCP ASAP for diabetes management   All of the patients questions were answered with apparent satisfaction. The patient knows to call the clinic with any problems, questions or concerns.  The total time spent in the appt was 25 minutes and more than 50% was on counseling and direct patient cares.     Sullivan Lone MD MS AAHIVMS [facility] Hematology/Oncology Physician [facility]  (Office):       [phone] (Work cell):  [phone] (Fax):           [phone]  02/21/2019 10:09 AM  I, Baldwin Jamaica, am acting as a scribe for Dr. Sullivan Lone.   .I have reviewed the above documentation for accuracy and completeness, and I agree with the above. Brunetta Genera MD

## 2019-02-21 NOTE — Telephone Encounter (Signed)
Scheduled appt per 4/2 los.  RTC with Dr Irene Limbo with labs in 4 months F/u with PCP ASAP for diabetes management.  Printed calendar and mailed it

## 2019-03-07 ENCOUNTER — Telehealth (INDEPENDENT_AMBULATORY_CARE_PROVIDER_SITE_OTHER): Payer: Medicare HMO | Admitting: Family Medicine

## 2019-03-07 ENCOUNTER — Other Ambulatory Visit: Payer: Self-pay

## 2019-03-07 DIAGNOSIS — R05 Cough: Secondary | ICD-10-CM | POA: Diagnosis not present

## 2019-03-07 DIAGNOSIS — R059 Cough, unspecified: Secondary | ICD-10-CM

## 2019-03-07 MED ORDER — BENZONATATE 100 MG PO CAPS: 100.0000 mg | ORAL_CAPSULE | Freq: Two times a day (BID) | ORAL | 0 refills | Status: DC | PRN

## 2019-03-07 MED ORDER — LORATADINE 10 MG PO TABS: 10.0000 mg | ORAL_TABLET | Freq: Every day | ORAL | 1 refills | Status: DC

## 2019-03-07 NOTE — Assessment & Plan Note (Signed)
Likely due to allergies.  Does not seem to be infection or bronchospasm.  Could be related to acei but has taken for years.  Trial of antihistamine loratadine.  Asked to call back if any shortness of breath or worsening or fever.  Asked to call back in one month for a diabetes visit - has been more than 1 year.

## 2019-03-07 NOTE — Progress Notes (Signed)
[place] Telemedicine Visit  Patient consented to have virtual visit. Method of visit: Telephone  Encounter participants: Patient: Thoren DELMONT PROSCH - located at home Provider: Lind Covert - located at office  Others (if applicable): no  Chief Complaint: cough nose congestion  HPI:  For several weeks.  Returned from [place] one month ago.  Now with dry cough, nose stopped up.  No fever or shortness of breath or leg swelling or change in medication.  Not taking any otc medications.    ROS: per HPI  Pertinent PMHx: Diabetes CLL in remission, Hypertension taking lisinopril for years  Exam:  Respiratory: no signs of distress.  Rapid speech Psych:  Cognition and judgment appear intact. Alert, communicative  and cooperative with normal attention span and concentration. No apparent delusions, illusions, hallucinations   Assessment/Plan:  Cough Likely due to allergies.  Does not seem to be infection or bronchospasm.  Could be related to acei but has taken for years.  Trial of antihistamine loratadine.  Asked to call back if any shortness of breath or worsening or fever.  Asked to call back in one month for a diabetes visit - has been more than 1 year.      Time spent during visit with patient: 15 minutes

## 2019-04-05 ENCOUNTER — Other Ambulatory Visit: Payer: Self-pay

## 2019-04-05 ENCOUNTER — Encounter: Payer: Self-pay | Admitting: Family Medicine

## 2019-04-05 ENCOUNTER — Ambulatory Visit (INDEPENDENT_AMBULATORY_CARE_PROVIDER_SITE_OTHER): Payer: Medicare HMO | Admitting: Family Medicine

## 2019-04-05 VITALS — BP 110/52 | HR 91 | Wt 134.2 lb

## 2019-04-05 DIAGNOSIS — E1169 Type 2 diabetes mellitus with other specified complication: Secondary | ICD-10-CM

## 2019-04-05 DIAGNOSIS — R05 Cough: Secondary | ICD-10-CM | POA: Diagnosis not present

## 2019-04-05 DIAGNOSIS — E11319 Type 2 diabetes mellitus with unspecified diabetic retinopathy without macular edema: Secondary | ICD-10-CM | POA: Diagnosis not present

## 2019-04-05 DIAGNOSIS — I10 Essential (primary) hypertension: Secondary | ICD-10-CM

## 2019-04-05 DIAGNOSIS — R1310 Dysphagia, unspecified: Secondary | ICD-10-CM

## 2019-04-05 DIAGNOSIS — E785 Hyperlipidemia, unspecified: Secondary | ICD-10-CM | POA: Diagnosis not present

## 2019-04-05 DIAGNOSIS — R059 Cough, unspecified: Secondary | ICD-10-CM

## 2019-04-05 DIAGNOSIS — Z794 Long term (current) use of insulin: Secondary | ICD-10-CM | POA: Diagnosis not present

## 2019-04-05 LAB — POCT GLYCOSYLATED HEMOGLOBIN (HGB A1C): HbA1c, POC (controlled diabetic range): 13.4 % — AB (ref 0.0–7.0)

## 2019-04-05 MED ORDER — ATORVASTATIN CALCIUM 40 MG PO TABS: 40.0000 mg | ORAL_TABLET | Freq: Every day | ORAL | 3 refills | Status: DC

## 2019-04-05 MED ORDER — INSULIN GLARGINE 100 UNIT/ML SOLOSTAR PEN: PEN_INJECTOR | SUBCUTANEOUS | 11 refills | Status: DC

## 2019-04-05 MED ORDER — LISINOPRIL 5 MG PO TABS: 5.0000 mg | ORAL_TABLET | Freq: Every day | ORAL | 3 refills | Status: DC

## 2019-04-06 LAB — LIPID PANEL
Chol/HDL Ratio: 3.6 ratio (ref 0.0–5.0)
Cholesterol, Total: 121 mg/dL (ref 100–199)
HDL: 34 mg/dL — ABNORMAL LOW (ref >39)
LDL Calculated: 46 mg/dL (ref 0–99)
Triglycerides: 205 mg/dL — ABNORMAL HIGH (ref 0–149)
VLDL Cholesterol Cal: 41 mg/dL — ABNORMAL HIGH (ref 5–40)

## 2019-04-06 LAB — BASIC METABOLIC PANEL
BUN/Creatinine Ratio: 14 (ref 10–24)
BUN: 14 mg/dL (ref 8–27)
CO2: 23 mmol/L (ref 20–29)
Calcium: 9.4 mg/dL (ref 8.6–10.2)
Chloride: 98 mmol/L (ref 96–106)
Creatinine, Ser: 0.98 mg/dL (ref 0.76–1.27)
GFR calc Af Amer: 93 mL/min/{1.73_m2} (ref >59)
GFR calc non Af Amer: 81 mL/min/{1.73_m2} (ref >59)
Glucose: 353 mg/dL — ABNORMAL HIGH (ref 65–99)
Potassium: 5.4 mmol/L — ABNORMAL HIGH (ref 3.5–5.2)
Sodium: 133 mmol/L — ABNORMAL LOW (ref 134–144)

## 2019-04-16 DIAGNOSIS — R1310 Dysphagia, unspecified: Secondary | ICD-10-CM | POA: Insufficient documentation

## 2019-04-16 NOTE — Progress Notes (Signed)
    Subjective:  Alec York is a 68 y.o. male who presents to the [facility] today with a chief complaint of cough.   HPI: Patient complains of worsening cough over the past few months. He states that he breathes fine and has no SOB with exertion or at rest.  Cough is timed with eating.  He says it is right when he is actually trying to swallow/eat.  Worse with thin liquids/soups.  Denies fevers.  Claims weight loss but weight is consistent with 1 month ago.  History most signficiant for lymphoma for which he is following with oncology and has an appt in a few months.  He denies airway concerns  Chart review notes enlarged nodes from oncology note  We also talked about DM as he is uncontrolled.  Currently taking 30u lantus in AM and 20 QHS.  Has stopped his victoza  Objective:  Physical Exam: BP (!) 110/52   Pulse 91   Wt 134 lb 3.2 oz (60.9 kg)   SpO2 99%   BMI 20.41 kg/m   Gen: NAD, resting comfortably, thin CV: RRR with no murmurs appreciated ENT: no airway concern identified on oral exam, no assymetty identified, no stridor Neck: no significant palpable masses or tenderness on my exam Pulm: NWOB, CTAB with no crackles, wheezes, or rhonchi GI: Normal bowel sounds present. Soft, Nontender, Nondistended. MSK: no edema, cyanosis, or clubbing noted Skin: warm, dry Neuro: grossly normal, moves all extremities Psych: Normal affect and thought content  No results found for this or any previous visit (from the past 72 hour(s)).   Assessment/Plan:  Cough Exam benign and symptoms consistent with dysphagia.  Hx is however complicated by lymphoma still being followed by oncology.  Referall to GI placed to evaluate swallow function.  Will also order CXR to evaluate for identifiable problems  Type 2 diabetes mellitus with diabetic retinopathy Patient uncontrolled on 30uam and 20u pm lantus, a1c>13.  Discussed patient going to 35am and scheduling closed f/u with pharmacy to get further  adjustment  Patient stopped vitcoza on their own  Hyperlipidemia associated with type 2 diabetes mellitus Refill atorvastain 40 (high intensity), recheck lipid panel  Essential hypertension Controlled, refill lisinipril 5 for renal protection  Dysphagia Claims cough when taking liquids or thin foods.  No fever to indicate aspiration pneumonia and exam is benign.  Claim cough only with food, hx of lymphoma (follows with oncology)  Patient instructed to contact oncology and relay symptoms.  Referral to GI placed and CXR   Sherene Sires, DO FAMILY MEDICINE RESIDENT - PGY2 04/16/2019 8:26 AM

## 2019-04-16 NOTE — Assessment & Plan Note (Signed)
Exam benign and symptoms consistent with dysphagia.  Hx is however complicated by lymphoma still being followed by oncology.  Referall to GI placed to evaluate swallow function.  Will also order CXR to evaluate for identifiable problems

## 2019-04-16 NOTE — Assessment & Plan Note (Signed)
Refill atorvastain 40 (high intensity), recheck lipid panel

## 2019-04-16 NOTE — Assessment & Plan Note (Signed)
Claims cough when taking liquids or thin foods.  No fever to indicate aspiration pneumonia and exam is benign.  Claim cough only with food, hx of lymphoma (follows with oncology)  Patient instructed to contact oncology and relay symptoms.  Referral to GI placed and CXR

## 2019-04-16 NOTE — Assessment & Plan Note (Signed)
Controlled, refill lisinipril 5 for renal protection

## 2019-04-16 NOTE — Assessment & Plan Note (Signed)
Patient uncontrolled on 30uam and 20u pm lantus, a1c>13.  Discussed patient going to 35am and scheduling closed f/u with pharmacy to get further adjustment  Patient stopped vitcoza on their own

## 2019-04-17 ENCOUNTER — Telehealth: Payer: Self-pay | Admitting: *Deleted

## 2019-04-17 NOTE — Telephone Encounter (Signed)
-----   Message from Sherene Sires, DO sent at 04/16/2019  8:29 AM EDT ----- Patient needs scheduled appt with pharm or doc for further DM management, also remind to get CXR and track GI referral

## 2019-04-17 NOTE — Telephone Encounter (Signed)
Contacted pt to see about scheduling an appointment and he said he cant come in right now that he will call.  I also asked about his Xray and he said he couldn't do it right now but that he would. Eyonna Sandstrom Zimmerman Rumple, CMA

## 2019-05-04 IMAGING — PT NM PET TUM IMG INITIAL (PI) SKULL BASE T - THIGH
1 of 9 series · 1 of 25 positions shown · non-contrast
Comparison: None.

CLINICAL DATA: Initial treatment strategy for large B-cell
lymphoma.

EXAM:
NUCLEAR MEDICINE PET SKULL BASE TO THIGH
TECHNIQUE: 8.67 mCi F-18 FDG was injected intravenously. Full-ring PET imaging
was performed from the skull base to thigh after the radiotracer. CT
data was obtained and used for attenuation correction and anatomic
localization.
Fasting blood glucose: 427 mg/dl

[Series 4: ct sk_thigh 5.0 b31f · axial · 5.0mm · 0.89mm/px · 1 of 208 slices shown]
[im 208/208  brain]
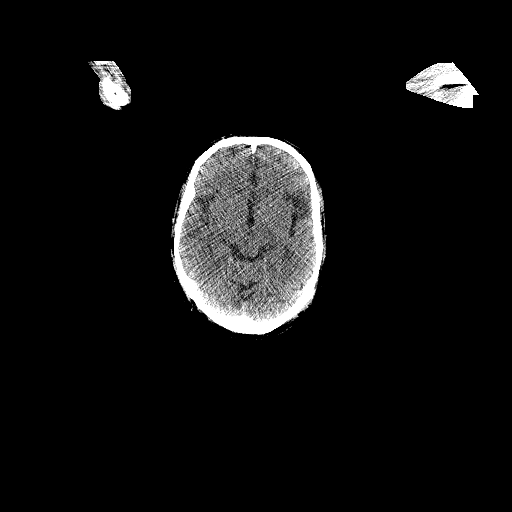

[1 of 25 positions shown; findings below may reference images not displayed]

FINDINGS: Mediastinal blood pool activity: SUV max

NECK: Hypermetabolic lymphadenopathy is demonstrated.

17 mm short axis level 5 lymph node on the right is hypermetabolic
with SUV max of 5.4. Small scattered level 2 a and 2 B lymph nodes
bilaterally. Index node on the left on image 25 measures 9 mm and
SUV max is 3.5.

Right-sided supraclavicular node on image 41 measures 12.5 mm and
SUV max is 4.16.

Incidental CT findings: none

CHEST: Bilateral axillary lymphadenopathy. Index node in the right
axilla on image number 52 measures 29.5 x 12.5 mm and SUV max is
2.8.

Index node in the left axilla on image number 55 measures 10.5 mm
and SUV max is [REDACTED] mm left subpectoral lymph node on image number 49 has an SUV
max of 2.83.

Small scattered mediastinal and hilar lymph nodes. Subcarinal lymph
node on image number 70 measures 10 mm and SUV max is 3.65.

No worrisome pulmonary lesions.

Incidental CT findings: Calcified granuloma in the right lower lobe.
Age advanced atherosclerotic calcifications involving the coronary
arteries.

ABDOMEN/PELVIS: No abnormal hypermetabolic activity within the
liver, pancreas, adrenal glands, or spleen. No hypermetabolic lymph
nodes in the abdomen or pelvis. Small scattered inguinal lymph nodes
but no hypermetabolism.

Incidental CT findings: Age advanced atherosclerotic calcifications
involving the aorta and iliac arteries.

Cholelithiasis.

Diffuse fatty infiltration of the liver.

Mild mesenteric interstitial changes and small lymph nodes which are
not hypermetabolic. This is most likely benign mesenteritis or
panniculitis.

Advanced vascular calcifications in noted in the upper thighs
bilaterally.

SKELETON: No significant bony findings. No areas of hypermetabolism
to suggest osseous lymphoma.

Incidental CT findings: none
IMPRESSION: 1. Enlarged and hypermetabolic lymph nodes involving the neck,
axilla, subpectoral and mediastinal lymph nodes.
2. No lymphadenopathy below the diaphragm.
3. No findings for osseous lymphoma.
4. Age advanced atherosclerotic calcifications.
5. Incidental cholelithiasis and diffuse fatty infiltration of the
liver.

## 2019-05-07 IMAGING — US US BIOPSY LYMPH NODE
1 series · 13 of 17 positions shown · non-contrast
Comparison: PET-CT - 06/15/2018

MEDICATIONS:
None

ANESTHESIA/SEDATION:
None

COMPLICATIONS:
None immediate.

INDICATION: Recent diagnosis of non-Hodgkin's lymphoma. Please perform
ultrasound-guided biopsy of dominant hypermetabolic right cervical
lymph node for tissue diagnostic purposes.

EXAM:
ULTRASOUND-GUIDED RIGHT CERVICAL LYMPH NODE BIOPSY
TECHNIQUE: Informed written consent was obtained from the patient after a
discussion of the risks, benefits and alternatives to treatment.
Questions regarding the procedure were encouraged and answered.
Initial ultrasound scanning demonstrated enlarged (approximately
x 1.3 cm hypoechoic lymph node correlating with the dominant
hypermetabolic lymph node seen within the right mid lateral neck on
preceding PET-CT image 20, series 4. an ultrasound image was saved
for documentation purposes. The procedure was planned. A timeout was
performed prior to the initiation of the procedure.

[Series 1: us biopsy lymph node · 13 of 17 slices shown]
[im 1/17]
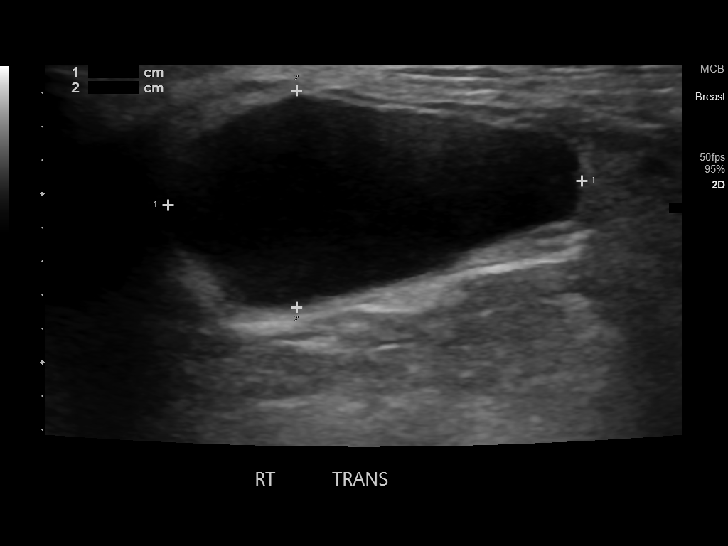
[im 2/17]
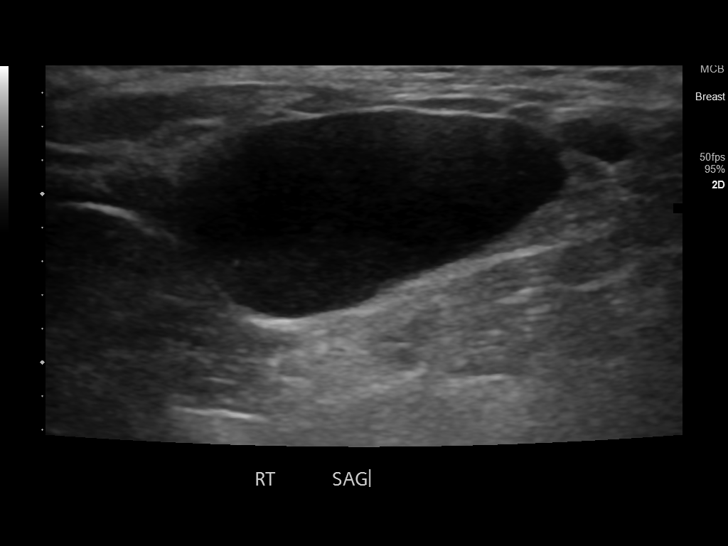
[im 4/17]
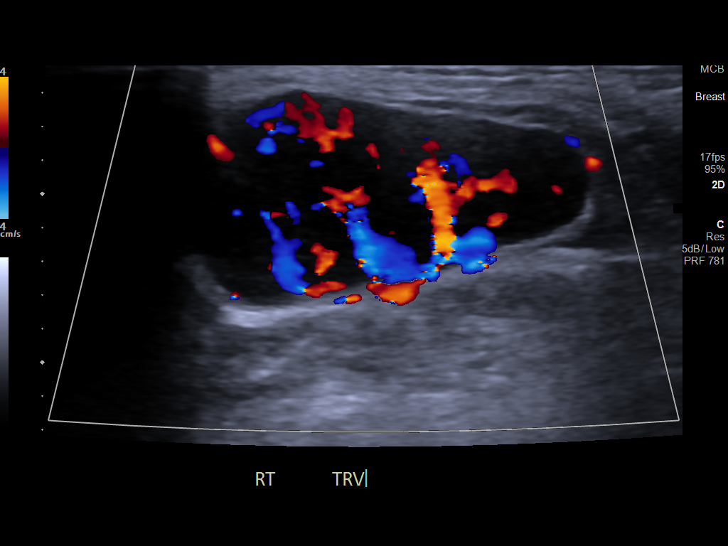
[im 5/17]
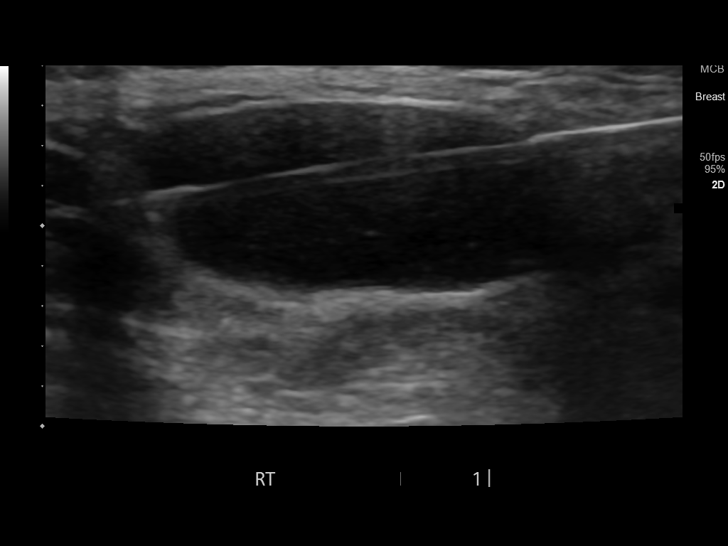
[im 6/17]
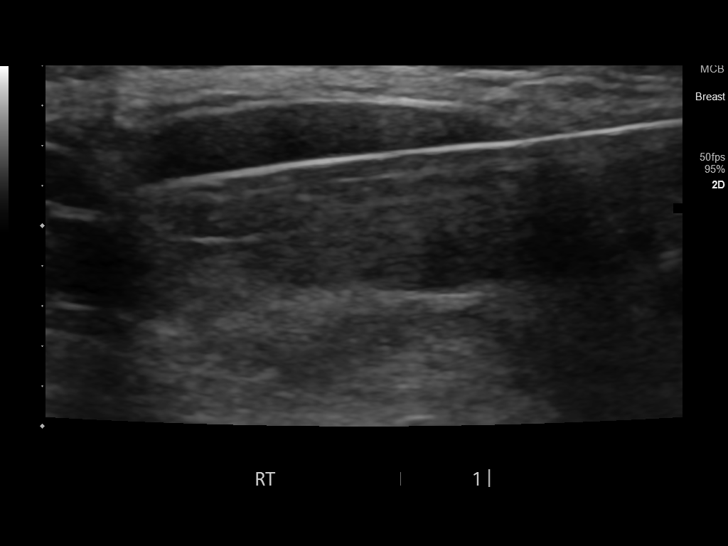
[im 8/17]
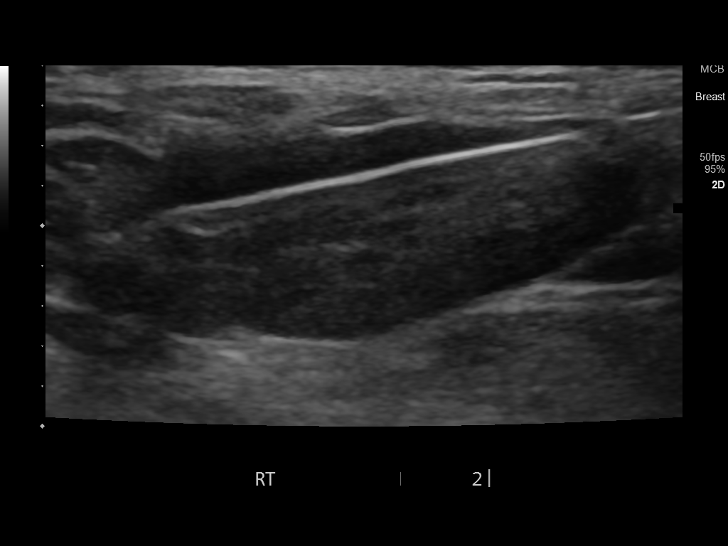
[im 9/17]
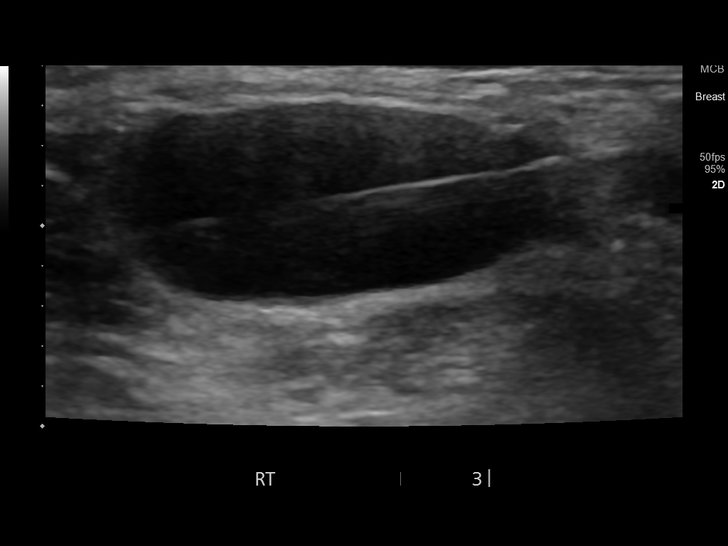
[im 10/17]
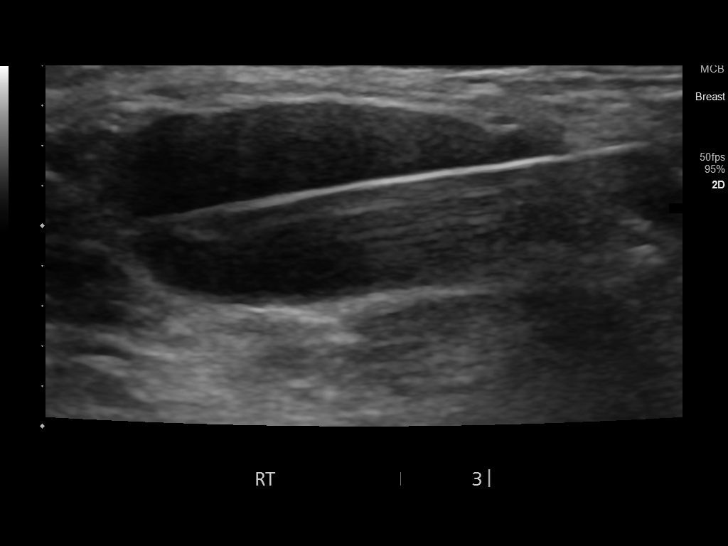
[im 12/17]
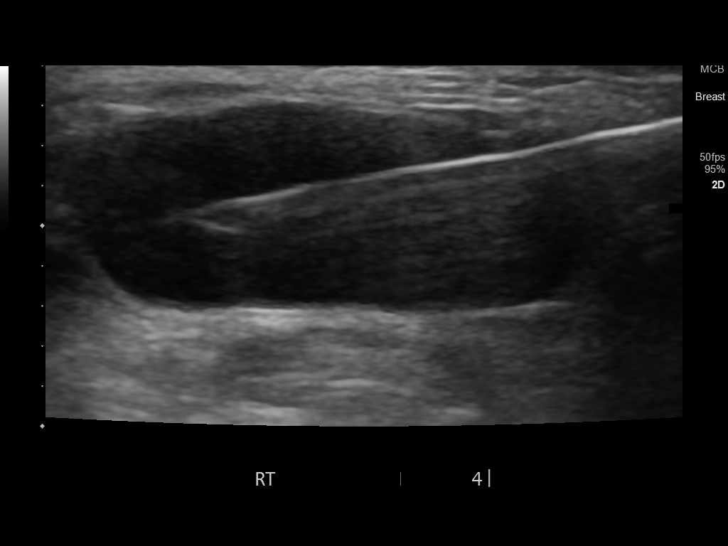
[im 13/17]
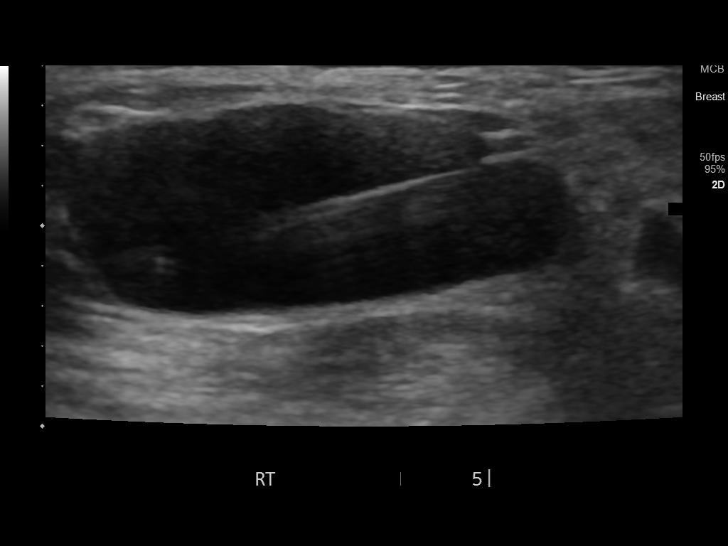
[im 14/17]
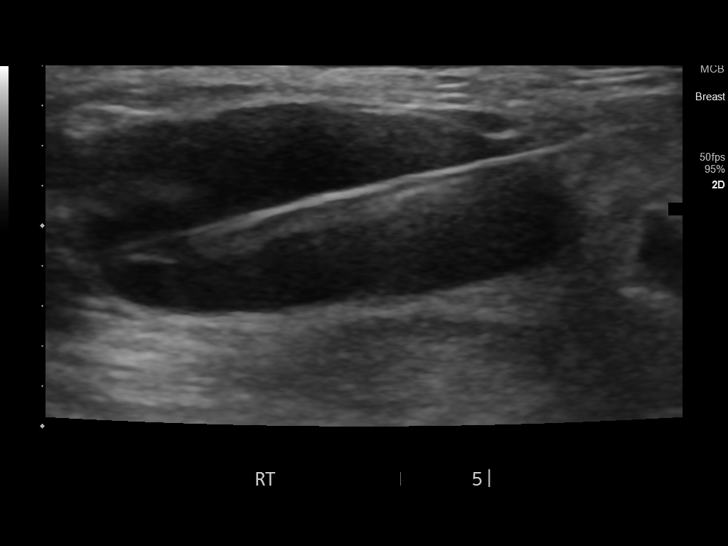
[im 16/17]
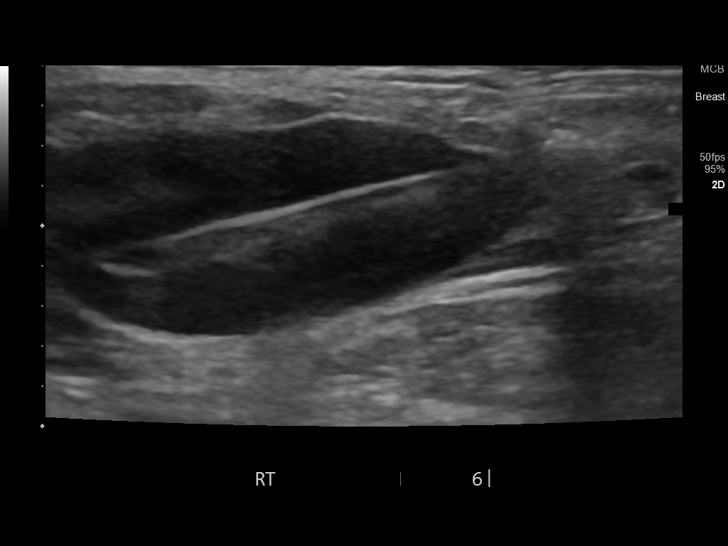
[im 17/17]
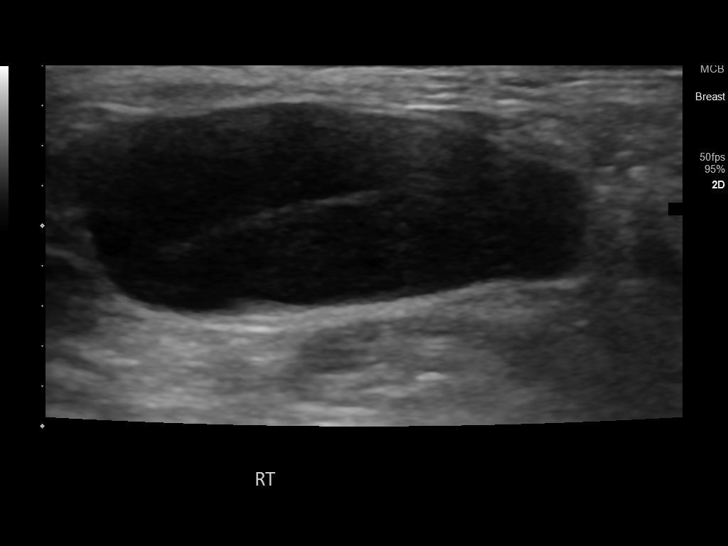

[13 of 17 positions shown; findings below may reference images not displayed]

The operative was prepped and draped in the usual sterile fashion,
and a sterile drape was applied covering the operative field. A
timeout was performed prior to the initiation of the procedure.
Local anesthesia was provided with 1% lidocaine with epinephrine.

Under direct ultrasound guidance, an 18 gauge core needle device was
utilized to obtain to obtain 6 core needle biopsies of the dominant
right mid lateral cervical lymph node.

The samples were placed in saline and submitted to pathology. The
needle was removed and hemostasis was achieved with manual
compression. Post procedure scan was negative for significant
hematoma. A dressing was placed. The patient tolerated the procedure
well without immediate postprocedural complication.
IMPRESSION: Technically successful ultrasound guided biopsy of dominant right
mid lateral cervical lymph node.

## 2019-05-08 DIAGNOSIS — E113512 Type 2 diabetes mellitus with proliferative diabetic retinopathy with macular edema, left eye: Secondary | ICD-10-CM | POA: Diagnosis not present

## 2019-05-08 DIAGNOSIS — E113591 Type 2 diabetes mellitus with proliferative diabetic retinopathy without macular edema, right eye: Secondary | ICD-10-CM | POA: Diagnosis not present

## 2019-05-08 DIAGNOSIS — H3582 Retinal ischemia: Secondary | ICD-10-CM | POA: Diagnosis not present

## 2019-05-13 ENCOUNTER — Telehealth: Payer: Self-pay

## 2019-05-13 NOTE — Telephone Encounter (Signed)
Patient calls nurse line requesting results of lab work from last month. Patient would just like a letter mailed to him.

## 2019-05-13 NOTE — Telephone Encounter (Signed)
Pt calling to check status.  He is very anxious about results. Christen Bame, CMA

## 2019-05-14 NOTE — Telephone Encounter (Signed)
Patient calling again. Happy to tell him what his results are. Just clarify specifics for me. Thanks!

## 2019-05-15 ENCOUNTER — Other Ambulatory Visit: Payer: Self-pay | Admitting: Family Medicine

## 2019-05-15 DIAGNOSIS — R899 Unspecified abnormal finding in specimens from other organs, systems and tissues: Secondary | ICD-10-CM

## 2019-05-15 NOTE — Progress Notes (Addendum)
See previous phone note. Called pt in previous note to see if he wants to have a letter with results or if he would like to have a tele-visit.  If he calls back please inform him that the doctor would like for him to come in for labs prior to the visit. Erminio Nygard Zimmerman Rumple, CMA     Contacted pt on 05/20/2019 and he did not want to come to office, set up a visit via telephone on 05/28/2019 @3 :30pm.  Roran Wegner Lena, CMA

## 2019-05-15 NOTE — Telephone Encounter (Addendum)
See orders only note as well for this pt.  LVM on home and mobile to call office back to see if he just wants a letter mailed with results, or if he wants a more detailed conversation then we can schedule a tele-visit with doctor, if he chooses a visit please assist him in getting this scheduled. April Zimmerman Rumple, CMA

## 2019-05-20 ENCOUNTER — Telehealth: Payer: Self-pay | Admitting: Family Medicine

## 2019-05-20 NOTE — Telephone Encounter (Signed)
Pt called and would like to have a referral for his "hearing issues and ear pain." I informed him that he would need to be seen in our office for these issues and then they could discuss referral. He continued to tell me " I am not coming there for this they already know I cant hear."

## 2019-05-20 NOTE — Telephone Encounter (Signed)
Contacted pt and scheduled him a telephone visit on 05/28/2019 @3 :30pm to go over results and discuss hearing problems. April Zimmerman Rumple, CMA

## 2019-05-20 NOTE — Telephone Encounter (Signed)
Contacted pt and scheduled him a telephone visit on 05/28/2019 @3 :30pm to go over results and discuss hearing problems. Rowen Wilmer Zimmerman Rumple, CMA Sherene Sires, DO  [facility] 9 minutes ago (11:10 AM)   If he doesn't want to come to the office, you can schedule him for telemedicine with me to discuss his hearing/ear complaints   Message text

## 2019-05-21 ENCOUNTER — Other Ambulatory Visit: Payer: Self-pay

## 2019-05-21 ENCOUNTER — Telehealth (INDEPENDENT_AMBULATORY_CARE_PROVIDER_SITE_OTHER): Payer: Medicare HMO | Admitting: Family Medicine

## 2019-05-21 DIAGNOSIS — H9191 Unspecified hearing loss, right ear: Secondary | ICD-10-CM | POA: Diagnosis not present

## 2019-05-28 ENCOUNTER — Telehealth: Payer: Medicare HMO | Admitting: Family Medicine

## 2019-05-28 DIAGNOSIS — E113512 Type 2 diabetes mellitus with proliferative diabetic retinopathy with macular edema, left eye: Secondary | ICD-10-CM | POA: Diagnosis not present

## 2019-05-28 NOTE — Progress Notes (Signed)
Virtual Visit via Telephone Note  I connected with Alec York on 05/28/19 at  2:30 PM EDT by telephone and verified that I am speaking with the correct person using two identifiers.  Location: Patient: at home Provider: [facility] clinic   I discussed the limitations, risks, security and privacy concerns of performing an evaluation and management service by telephone and the availability of in person appointments. I also discussed with the patient that there may be a patient responsible charge related to this service. The patient expressed understanding and agreed to proceed.   History of Present Illness: Patient refused to come to clinic out of concern for COVID.  He states he has had a few days of left ear pain now with what he says is swelling on that side.  He firmly denies airway impact, fevers, or trouble swallowing.Alec York  He can still hear but says it doesn't sound right on the impacted side.  He has no balance problems.  He insists on referal to ENT because he says if he has to risk going to an office he wants to be at a specialist.   Observations/Objective: Patient is calmly speaking in full sentences, no distress in his voice  Assessment and Plan: Patient describes a likely infection of some sort in/around the ear but refuses video call and in person exam.  He does not sound in distress and clearly denies airway concerns  ED precautions clearly described and patient told it can take over a week to see ENT.  He still wants ENT referal over clinic appt.  Follow Up Instructions:    I discussed the assessment and treatment plan with the patient. The patient was provided an opportunity to ask questions and all were answered. The patient agreed with the plan and demonstrated an understanding of the instructions.   The patient was advised to call back or seek an in-person evaluation if the symptoms worsen or if the condition fails to improve as anticipated.  I provided 13 minutes of  non-face-to-face time during this encounter.   Sherene Sires, DO

## 2019-06-11 DIAGNOSIS — H6123 Impacted cerumen, bilateral: Secondary | ICD-10-CM | POA: Diagnosis not present

## 2019-06-11 DIAGNOSIS — C8581 Other specified types of non-Hodgkin lymphoma, lymph nodes of head, face, and neck: Secondary | ICD-10-CM | POA: Diagnosis not present

## 2019-06-23 NOTE — Progress Notes (Signed)
HEMATOLOGY/ONCOLOGY CLNIC NOTE  Date of Service: 06/24/2019  Patient Care Team: Sherene Sires, DO as PCP - General (Family Medicine)  Melissa Montane, MD as ENT  CHIEF COMPLAINTS/PURPOSE OF CONSULTATION:  Non-Hodgkin's Lymphoma   HISTORY OF PRESENTING ILLNESS:   Alec York is a wonderful 68 y.o. male who has been referred to [place] by ENT Dr. Melissa Montane for evaluation and management of Non-Hodgkin's Lymphoma. He is accompanied today by his wife. The pt reports that he is doing well overall.   The pt had a Fine needle aspiration of the left neck mass on 05/16/18 which confirmed a B-cell Non-Hodgkin's lymphoma.   The pt reports well controlled DM and HTN. He denies neuropathy in his hands but this sometimes occurs in his legs. He also endorses vision changes related to his DM and he takes Lantus and Metformin. He denies heart or kidney problems, or previous surgeries. He notes that his PCP Dr. Wendee Beavers manages his DM.   He first noticed the right neck mass about 3 months ago and describes that the appearance was sudden. He denies any other lumps or bumps, fevers, chills, night sweats, unexpected weight loss, and change in appetite.   Of note prior to the patient's visit today, pt has had US Soft Tissue Head/Neck completed on 03/23/18 with results revealing Multiple well-circumscribed neck lesions are identified, consistent with lymph nodes, the largest of which measures 2.1 x 1.5 x 2.3 cm. Benign behavior is not established. Bulky adenopathy such as this could relate to lymphoma, or metastatic squamous cell carcinoma. There is no visible extranodal spread of tumor.   Most recent lab results (03/22/18) of CBC w/diff is as follows: all values are WNL except for WBC at 12.8k, HGB at 12.3, Lymphs abs at 6.3k.  On review of systems, pt reports left neck mass, eating well, and denies fevers, chills, night sweats, unexpected weight loss, noticing any other lumps or bumps, pain along the spine,  abdominal pains, leg swelling, testicular pain or swelling, skin rashes, and any other symptoms.   On Social Hx the pt reports that he quit smoking cigarettes 10 years ago, and had been smoking 5-6 cigarettes each day. He denies drinking any ETOH.  On Family Hx the pt denies cancer.   Interval History:   Alec York returns today for management and evaluation of his Non-Hodgkin's Lymphoma. The patient's last visit with [place] was on 02/21/2019. The pt reports that he is doing well overall.  The pt reports that he has gained about 12 lbs since his last visit. He has been eating well. He is currently taking Metformin and 81 mg aspirin. Denies fevers, infection concerns, chills, and night sweats. He recently had dentures made but they are not fitting well. He notes some eye changes related to his diabetes but they are being managed by his eye doctor.   Lab results today (06/24/2019) of CBC w/diff and CMP is as follows: all values are WNL except for WBC at 15.4k, HGB at 11.8, HCT at 37.7, 25.6 MCH, lymphs abs at 8.4, monocytes abd at 1.5, sodium at 134, glucose bld at 399, albumin at 3.4 06/24/2019 LDH at 186  On review of systems, pt reports breathing well, some weight gain and denies fevers, infection concerns, tingling in fingers and toes, chills, night sweats, and any other symptoms.   MEDICAL HISTORY:  Past Medical History:  Diagnosis Date  . Allergy   . Diabetes mellitus without complication ([place])   . Diabetic  retinopathy ([place])    Disabled 2/2 retinopathy  . GERD (gastroesophageal reflux disease)   . Hyperlipidemia   . Hypertension     SURGICAL HISTORY: No past surgical history on file.  SOCIAL HISTORY: Social History   Socioeconomic History  . Marital status: Married    Spouse name: Not on file  . Number of children: Not on file  . Years of education: Not on file  . Highest education level: Not on file  Occupational History  . Not on file  Social Needs  . Financial  resource strain: Not on file  . Food insecurity    Worry: Not on file    Inability: Not on file  . Transportation needs    Medical: Not on file    Non-medical: Not on file  Tobacco Use  . Smoking status: Former Research scientist (life sciences)  . Smokeless tobacco: Never Used  Substance and Sexual Activity  . Alcohol use: No    Alcohol/week: 0.0 standard drinks  . Drug use: No  . Sexual activity: Yes    Birth control/protection: None  Lifestyle  . Physical activity    Days per week: Not on file    Minutes per session: Not on file  . Stress: Not on file  Relationships  . Social Herbalist on phone: Not on file    Gets together: Not on file    Attends religious service: Not on file    Active member of club or organization: Not on file    Attends meetings of clubs or organizations: Not on file    Relationship status: Not on file  . Intimate partner violence    Fear of current or ex partner: Not on file    Emotionally abused: Not on file    Physically abused: Not on file    Forced sexual activity: Not on file  Other Topics Concern  . Not on file  Social History Narrative  . Not on file    FAMILY HISTORY: Family History  Problem Relation Age of Onset  . Diabetes Neg Hx     ALLERGIES:  has No Known Allergies.  MEDICATIONS:  Current Outpatient Medications  Medication Sig Dispense Refill  . ACCU-CHEK SOFTCLIX LANCETS lancets Use to check blood sugar four times a day or as directed. 100 each 12  . aspirin 81 MG tablet Take 81 mg by mouth daily.    [name] atorvastatin (LIPITOR) 40 MG tablet Take 1 tablet (40 mg total) by mouth daily. 90 tablet 3  . benzonatate (TESSALON) 100 MG capsule Take 1 capsule (100 mg total) by mouth 2 (two) times daily as needed for cough. 20 capsule 0  . blood glucose meter kit and supplies KIT Dispense based on patient and insurance preference. Use up to four times daily as directed. Dx E11.319 1 each 0  . gabapentin (NEURONTIN) 300 MG capsule Take 2 capsules (600  mg total) at bedtime by mouth AND 1 capsule (300 mg total) daily with breakfast AND 1 capsule (300 mg total) daily after lunch. You may take every 8 hours.. 360 capsule 3  . glucagon 1 MG injection Inject 1 mg into the muscle once as needed. May repeat in 15 minutes if needed. 1 each 12  . glucose blood (ACCU-CHEK AVIVA PLUS) test strip Use up to 4 times daily to check blood sugar ICD 10 code E11.319 100 each 3  . Insulin Glargine (LANTUS) 100 UNIT/ML Solostar Pen Inject 35 Units into the skin every morning AND  20 Units at bedtime. 15 mL 11  . Lancet Devices (ACCU-CHEK SOFTCLIX) lancets Use as instructed up to 4 times daily.  Dx E11.319 400 each 3  . lisinopril (ZESTRIL) 5 MG tablet Take 1 tablet (5 mg total) by mouth daily. 90 tablet 3  . loratadine (CLARITIN) 10 MG tablet Take 1 tablet (10 mg total) by mouth daily. 30 tablet 1  . metFORMIN (GLUCOPHAGE) 1000 MG tablet Take 1 tablet (1,000 mg total) by mouth 2 (two) times daily with a meal. 180 tablet 3  . NOVOLOG FLEXPEN 100 UNIT/ML FlexPen Inject 10 Units into the skin 3 (three) times daily with meals. 15 mL 11  . RELION PEN NEEDLES 31G X 6 MM MISC USE AS DIRECTED TO  CHECK  BLOOD  SUGAR 50 each 23   No current facility-administered medications for this visit.     REVIEW OF SYSTEMS:    A 10+ POINT REVIEW OF SYSTEMS WAS OBTAINED including neurology, dermatology, psychiatry, cardiac, respiratory, lymph, extremities, GI, GU, Musculoskeletal, constitutional, breasts, reproductive, HEENT.  All pertinent positives are noted in the HPI.  All others are negative.   PHYSICAL EXAMINATION: ECOG PERFORMANCE STATUS: 1 - Symptomatic but completely ambulatory  Vitals:   06/24/19 1052  BP: 115/62  Pulse: 78  Resp: 18  Temp: 98.2 F (36.8 C)  SpO2: 100%   Filed Weights   06/24/19 1052  Weight: 144 lb 4.8 oz (65.5 kg)   .Body mass index is 21.94 kg/m.   GENERAL:alert, in no acute distress and comfortable SKIN: no acute rashes, no significant  lesions EYES: conjunctiva are pink and non-injected, sclera anicteric OROPHARYNX: MMM, no exudates, no oropharyngeal erythema or ulceration NECK: supple, no JVD LYMPH: 1-2cm LN at the left angle of the jaw, unchanged. No palpable lymphadenopathy in the axillary or inguinal regions LUNGS: clear to auscultation b/l with normal respiratory effort HEART: regular rate & rhythm ABDOMEN:  normoactive bowel sounds , non tender, not distended. No palpable hepatosplenomegaly.  Extremity: no pedal edema PSYCH: alert & oriented x 3 with fluent speech NEURO: no focal motor/sensory deficits   LABORATORY DATA:  I have reviewed the data as listed  . CBC Latest Ref Rng & Units 06/24/2019 02/21/2019 06/18/2018  WBC 4.0 - 10.5 K/uL 15.4(H) 18.3(H) 13.8(H)  Hemoglobin 13.0 - 17.0 g/dL 11.8(L) 12.2(L) 13.8  Hematocrit 39.0 - 52.0 % 37.7(L) 38.8(L) 40.8  Platelets 150 - 400 K/uL 205 233 251    . CMP Latest Ref Rng & Units 06/24/2019 04/05/2019 02/21/2019  Glucose 70 - 99 mg/dL 399(H) 353(H) 493(H)  BUN 8 - 23 mg/dL _0 Creatinine 0.61 - 1.24 mg/dL 1.14 0.98 1.27(H)  Sodium 135 - 145 mmol/L 134(L) 133(L) 129(L)  Potassium 3.5 - 5.1 mmol/L 5.1 5.4(H) 5.2(H)  Chloride 98 - 111 mmol/L 101 98 94(L)  CO2 22 - 32 mmol/L _1 Calcium 8.9 - 10.3 mg/dL 9.1 9.4 9.9  Total Protein 6.5 - 8.1 g/dL 7.0 - 8.0  Total Bilirubin 0.3 - 1.2 mg/dL 0.3 - 0.4  Alkaline Phos 38 - 126 U/L 94 - 142(H)  AST 15 - 41 U/L 17 - 16  ALT 0 - 44 U/L 15 - 13   06/18/18 Right Cervical LN Needle/core Biopsy:    05/23/18 Fine Needle Aspiration Flow Cytometry:    RADIOGRAPHIC STUDIES: I have personally reviewed the radiological images as listed and agreed with the findings in the report. No results found.  ASSESSMENT & PLAN:  68 y.o. male with  1. 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  05/16/18 Flow cytometry results which revealed NHL B-Cell  lymphoma, and discussed that this is not completely diagnostic   03/23/18 US Soft Tissue Head/Neck revealed Multiple well-circumscribed neck lesions are identified, consistent with lymph nodes, the largest of which measures 2.1 x 1.5 x 2.3 cm. Benign behavior is not established. Bulky adenopathy such as this could relate to lymphoma, or metastatic squamous cell carcinoma. There is no visible extranodal spread of tumor.   06/18/18 biopsy favored a low grade Marginal Zone Lymphoma 06/15/18 PET/CT revealed Enlarged and hypermetabolic lymph nodes involving the neck, axilla, subpectoral and mediastinal lymph nodes. No lymphadenopathy below the diaphragm. No findings for osseous lymphoma  06/07/18 ECHO for treatment planning revealed a normal ejection fraction  PLAN: -Discussed pt labwork today, 06/24/2019; white counts are stable, blood sugar is high -Discussed that the pt's priority right now should be diabetes management.  -No actionable progression of his lymphoma at this time -No indications for active treatment at this time -Discussed that treatment is not recommend at this time especially because it would increase his risk for contracting covid-19 -Recommend staying up to date with annual flu vaccines and both pneumonia vaccines every 5 years -Will see the pt back in 4 months  2.  Patient Active Problem List   Diagnosis Date Noted  . Dysphagia 04/16/2019  . Lymphadenopathy 04/12/2018  . Cervical lymphadenopathy 03/22/2018  . Right leg swelling 10/19/2017  . PAD (peripheral artery disease) ([place]) 07/07/2017  . Neuropathic pain 07/07/2017  . Knee pain, chronic 06/15/2017  . Nonspecific abnormal electrocardiogram (ECG) (EKG) 09/02/2015  . Adhesive capsulitis of right shoulder 08/19/2015  . Bursitis of right shoulder 06/01/2015  . Allergic rhinitis 06/01/2015  . Acid reflux 05/13/2015  . Type 2 diabetes mellitus with diabetic retinopathy ([place]) 05/13/2015  . Essential hypertension 05/13/2015   . Hyperlipidemia associated with type 2 diabetes mellitus ([place]) 05/13/2015  . Cough 05/13/2015   F/u with PCP for mx esp for uncontrolled DM2   RTC with Dr Irene Limbo with labs in 4 months   All of the patients questions were answered with apparent satisfaction. The patient knows to call the clinic with any problems, questions or concerns.  The total time spent in the appt was 20 minutes and more than 50% was on counseling and direct patient cares.   Sullivan Lone MD MS AAHIVMS [facility] Hematology/Oncology Physician [facility]  (Office):       [phone] (Work cell):  [phone] (Fax):           [phone]  06/24/2019 11:19 AM  I, De Burrs, am acting as a scribe for Dr. Irene Limbo  .I have reviewed the above documentation for accuracy and completeness, and I agree with the above. Brunetta Genera MD

## 2019-06-24 ENCOUNTER — Other Ambulatory Visit: Payer: Self-pay

## 2019-06-24 ENCOUNTER — Inpatient Hospital Stay: Payer: Medicare HMO | Attending: Hematology

## 2019-06-24 ENCOUNTER — Telehealth: Payer: Self-pay | Admitting: Hematology

## 2019-06-24 ENCOUNTER — Inpatient Hospital Stay (HOSPITAL_BASED_OUTPATIENT_CLINIC_OR_DEPARTMENT_OTHER): Payer: Medicare HMO | Admitting: Hematology

## 2019-06-24 VITALS — BP 115/62 | HR 78 | Temp 98.2°F | Resp 18 | Ht 68.0 in | Wt 144.3 lb

## 2019-06-24 DIAGNOSIS — E119 Type 2 diabetes mellitus without complications: Secondary | ICD-10-CM | POA: Insufficient documentation

## 2019-06-24 DIAGNOSIS — Z7982 Long term (current) use of aspirin: Secondary | ICD-10-CM | POA: Diagnosis not present

## 2019-06-24 DIAGNOSIS — I10 Essential (primary) hypertension: Secondary | ICD-10-CM | POA: Insufficient documentation

## 2019-06-24 DIAGNOSIS — C9110 Chronic lymphocytic leukemia of B-cell type not having achieved remission: Secondary | ICD-10-CM

## 2019-06-24 DIAGNOSIS — Z7984 Long term (current) use of oral hypoglycemic drugs: Secondary | ICD-10-CM | POA: Insufficient documentation

## 2019-06-24 DIAGNOSIS — C8591 Non-Hodgkin lymphoma, unspecified, lymph nodes of head, face, and neck: Secondary | ICD-10-CM | POA: Insufficient documentation

## 2019-06-24 DIAGNOSIS — C8590 Non-Hodgkin lymphoma, unspecified, unspecified site: Secondary | ICD-10-CM | POA: Diagnosis not present

## 2019-06-24 LAB — CMP (CANCER CENTER ONLY)
ALT: 15 U/L (ref 0–44)
AST: 17 U/L (ref 15–41)
Albumin: 3.4 g/dL — ABNORMAL LOW (ref 3.5–5.0)
Alkaline Phosphatase: 94 U/L (ref 38–126)
Anion gap: 9 (ref 5–15)
BUN: 13 mg/dL (ref 8–23)
CO2: 24 mmol/L (ref 22–32)
Calcium: 9.1 mg/dL (ref 8.9–10.3)
Chloride: 101 mmol/L (ref 98–111)
Creatinine: 1.14 mg/dL (ref 0.61–1.24)
GFR, Est AFR Am: >60 mL/min (ref >60)
GFR, Estimated: >60 mL/min (ref >60)
Glucose, Bld: 399 mg/dL — ABNORMAL HIGH (ref 70–99)
Potassium: 5.1 mmol/L (ref 3.5–5.1)
Sodium: 134 mmol/L — ABNORMAL LOW (ref 135–145)
Total Bilirubin: 0.3 mg/dL (ref 0.3–1.2)
Total Protein: 7 g/dL (ref 6.5–8.1)

## 2019-06-24 LAB — CBC WITH DIFFERENTIAL/PLATELET
Abs Immature Granulocytes: 0.04 10*3/uL (ref 0.00–0.07)
Basophils Absolute: 0.1 10*3/uL (ref 0.0–0.1)
Basophils Relative: 1 %
Eosinophils Absolute: 0.4 10*3/uL (ref 0.0–0.5)
Eosinophils Relative: 3 %
HCT: 37.7 % — ABNORMAL LOW (ref 39.0–52.0)
Hemoglobin: 11.8 g/dL — ABNORMAL LOW (ref 13.0–17.0)
Immature Granulocytes: 0 %
Lymphocytes Relative: 54 %
Lymphs Abs: 8.4 10*3/uL — ABNORMAL HIGH (ref 0.7–4.0)
MCH: 25.6 pg — ABNORMAL LOW (ref 26.0–34.0)
MCHC: 31.3 g/dL (ref 30.0–36.0)
MCV: 81.8 fL (ref 80.0–100.0)
Monocytes Absolute: 1.5 10*3/uL — ABNORMAL HIGH (ref 0.1–1.0)
Monocytes Relative: 10 %
Neutro Abs: 4.9 10*3/uL (ref 1.7–7.7)
Neutrophils Relative %: 32 %
Platelets: 205 10*3/uL (ref 150–400)
RBC: 4.61 MIL/uL (ref 4.22–5.81)
RDW: 14.6 % (ref 11.5–15.5)
WBC: 15.4 10*3/uL — ABNORMAL HIGH (ref 4.0–10.5)
nRBC: 0 % (ref 0.0–0.2)

## 2019-06-24 LAB — LACTATE DEHYDROGENASE: LDH: 186 U/L (ref 98–192)

## 2019-06-24 NOTE — Telephone Encounter (Signed)
Scheduled appt per 8/3 los.  Printed and mailed appt calendar.

## 2019-06-25 DIAGNOSIS — E113512 Type 2 diabetes mellitus with proliferative diabetic retinopathy with macular edema, left eye: Secondary | ICD-10-CM | POA: Diagnosis not present

## 2019-06-25 DIAGNOSIS — H3582 Retinal ischemia: Secondary | ICD-10-CM | POA: Diagnosis not present

## 2019-06-25 DIAGNOSIS — E113591 Type 2 diabetes mellitus with proliferative diabetic retinopathy without macular edema, right eye: Secondary | ICD-10-CM | POA: Diagnosis not present

## 2019-06-28 DIAGNOSIS — I10 Essential (primary) hypertension: Secondary | ICD-10-CM | POA: Diagnosis not present

## 2019-06-28 DIAGNOSIS — Z961 Presence of intraocular lens: Secondary | ICD-10-CM | POA: Diagnosis not present

## 2019-06-28 DIAGNOSIS — Z0100 Encounter for examination of eyes and vision without abnormal findings: Secondary | ICD-10-CM | POA: Diagnosis not present

## 2019-06-28 DIAGNOSIS — E7800 Pure hypercholesterolemia, unspecified: Secondary | ICD-10-CM | POA: Diagnosis not present

## 2019-06-28 DIAGNOSIS — E109 Type 1 diabetes mellitus without complications: Secondary | ICD-10-CM | POA: Diagnosis not present

## 2019-07-05 ENCOUNTER — Other Ambulatory Visit: Payer: Self-pay

## 2019-07-05 DIAGNOSIS — E11319 Type 2 diabetes mellitus with unspecified diabetic retinopathy without macular edema: Secondary | ICD-10-CM

## 2019-07-06 MED ORDER — NOVOLOG FLEXPEN 100 UNIT/ML ~~LOC~~ SOPN: 10.0000 [IU] | PEN_INJECTOR | Freq: Three times a day (TID) | SUBCUTANEOUS | 11 refills | Status: DC

## 2019-07-12 ENCOUNTER — Other Ambulatory Visit: Payer: Self-pay

## 2019-07-12 DIAGNOSIS — E11319 Type 2 diabetes mellitus with unspecified diabetic retinopathy without macular edema: Secondary | ICD-10-CM

## 2019-07-12 NOTE — Telephone Encounter (Signed)
Patient called nurse line stating his meter is acting up and would like a new one called into his [place]. Please advise.

## 2019-07-15 ENCOUNTER — Other Ambulatory Visit: Payer: Self-pay | Admitting: Family Medicine

## 2019-07-15 MED ORDER — ACCU-CHEK FASTCLIX LANCETS MISC: 12 refills | Status: DC

## 2019-07-15 MED ORDER — ACCU-CHEK FASTCLIX LANCET KIT: PACK | 0 refills | Status: DC

## 2019-07-15 MED ORDER — ACCU-CHEK GUIDE W/DEVICE KIT: 1.0000 | PACK | Freq: Four times a day (QID) | 0 refills | Status: DC | PRN

## 2019-07-15 MED ORDER — ACCU-CHEK GUIDE VI STRP: ORAL_STRIP | 12 refills | Status: DC

## 2019-07-15 MED ORDER — BLOOD GLUCOSE MONITOR KIT: PACK | 0 refills | Status: DC

## 2019-07-15 NOTE — Addendum Note (Signed)
Addended by: Christen Bame D on: 07/15/2019 11:08 AM   Modules accepted: Orders

## 2019-07-15 NOTE — Telephone Encounter (Signed)
Script did not go thru (informed by Dr. Criss Rosales).  Pt informed, he request that we send in meter covered by medicaid.  Sent in [place] guide as requested.  Christen Bame, CMA

## 2019-07-17 DIAGNOSIS — E113512 Type 2 diabetes mellitus with proliferative diabetic retinopathy with macular edema, left eye: Secondary | ICD-10-CM | POA: Diagnosis not present

## 2019-08-01 ENCOUNTER — Other Ambulatory Visit: Payer: Self-pay

## 2019-08-01 DIAGNOSIS — R6889 Other general symptoms and signs: Secondary | ICD-10-CM | POA: Diagnosis not present

## 2019-08-01 DIAGNOSIS — Z20822 Contact with and (suspected) exposure to covid-19: Secondary | ICD-10-CM

## 2019-08-02 LAB — NOVEL CORONAVIRUS, NAA: SARS-CoV-2, NAA: NOT DETECTED

## 2019-09-20 ENCOUNTER — Telehealth: Payer: Self-pay | Admitting: *Deleted

## 2019-09-20 NOTE — Telephone Encounter (Signed)
"  Alec York [phone]).  Concerned about my Cancer and need to be seen by Dr. Irene Limbo who speaks my language.  When I tried to sleep last night, twice it seemed like everything was moving.  After a few minutes I was okay and asleep immediately.   Started this week and happens during the day also.  Did not check B/P last night. But this morning B/P = 125/65, Temp = 98.5.   No swollen lymph nodes.   No stomach pain.  Bowels moved twice. No sweating or perspiration. I am able to come in today."

## 2019-09-20 NOTE — Telephone Encounter (Signed)
Verbal order received and read back from Dr. Irene Limbo for [name] to drink lots of water to hydrate and contact PCP for vertigo.    [facility] notified of above "asking if vertigo is caused by cancer.  I have drank three bottled water this morning.  Just checked B/P = 95/56.  PCP not available until Wednesday so I will have my parents take me in for a walk in appointment at PCP office."

## 2019-09-25 ENCOUNTER — Ambulatory Visit (INDEPENDENT_AMBULATORY_CARE_PROVIDER_SITE_OTHER): Payer: Medicare HMO | Admitting: Family Medicine

## 2019-09-25 ENCOUNTER — Other Ambulatory Visit: Payer: Self-pay

## 2019-09-25 ENCOUNTER — Encounter: Payer: Self-pay | Admitting: Family Medicine

## 2019-09-25 VITALS — BP 104/58 | HR 87 | Ht 68.0 in | Wt 141.1 lb

## 2019-09-25 DIAGNOSIS — E11311 Type 2 diabetes mellitus with unspecified diabetic retinopathy with macular edema: Secondary | ICD-10-CM | POA: Diagnosis not present

## 2019-09-25 DIAGNOSIS — R42 Dizziness and giddiness: Secondary | ICD-10-CM

## 2019-09-25 DIAGNOSIS — I10 Essential (primary) hypertension: Secondary | ICD-10-CM

## 2019-09-25 DIAGNOSIS — E1121 Type 2 diabetes mellitus with diabetic nephropathy: Secondary | ICD-10-CM | POA: Diagnosis not present

## 2019-09-25 DIAGNOSIS — E1165 Type 2 diabetes mellitus with hyperglycemia: Secondary | ICD-10-CM | POA: Diagnosis not present

## 2019-09-25 LAB — POCT GLYCOSYLATED HEMOGLOBIN (HGB A1C): HbA1c, POC (controlled diabetic range): 10.8 % — AB (ref 0.0–7.0)

## 2019-09-25 LAB — POCT HEMOGLOBIN: Hemoglobin: 13.3 g/dL (ref 11–14.6)

## 2019-09-25 MED ORDER — LISINOPRIL 5 MG PO TABS: 2.5000 mg | ORAL_TABLET | Freq: Every day | ORAL | 3 refills | Status: DC

## 2019-09-25 NOTE — Patient Instructions (Addendum)
It was very nice to meet you today. Please enjoy the rest of your week. Today you were seen for blood sugar and dizziness. We changed lisinopril form 5 mg to 2.5mg . Follow up in 2 weeks to discuss medication for sexual dysfunction.  Please call the clinic at [phone] if your symptoms worsen or you have any concerns. It was our pleasure to serve you.

## 2019-09-25 NOTE — Progress Notes (Signed)
Subjective:    Patient ID: Alec York, male    DOB: 01/08/1951, 68 y.o.   MRN: [id number]   CC: A1c, Urine lab, and dizziness  HPI: Alec York presents today for an A1c check and a urine microalbumin creatinine ratio.   Diabetes Today his A1c is 10.8 which has decreased from 11.8.  CBGs at home average into the 300s.  Endorses polyuria, polydipsia, but no paresthesia.  Dizziness Felt dizzy Friday Saturday Sunday and feels tired with walking but no shortness of breath.  Wants to know if there are any vitamins that would make him feel less tired.   ED Says that he cannot achieve an erection when it is time to have sexual intercourse with his wife. We discussed returning two week for a follow up visit to discuss options as the acute issues of low blood pressure and dizziness today need more attention.   Smoking status reviewed  Review of Systems Per HPI, also denies recent illness, fever, headache, changes in vision, chest pain, shortness of breath, abdominal pain, N/V/D, weakness   Patient Active Problem List   Diagnosis Date Noted  . Dysphagia 04/16/2019  . Lymphadenopathy 04/12/2018  . Cervical lymphadenopathy 03/22/2018  . Right leg swelling 10/19/2017  . PAD (peripheral artery disease) ([place]) 07/07/2017  . Neuropathic pain 07/07/2017  . Knee pain, chronic 06/15/2017  . Nonspecific abnormal electrocardiogram (ECG) (EKG) 09/02/2015  . Adhesive capsulitis of right shoulder 08/19/2015  . Bursitis of right shoulder 06/01/2015  . Allergic rhinitis 06/01/2015  . Acid reflux 05/13/2015  . Type 2 diabetes mellitus with diabetic retinopathy ([place]) 05/13/2015  . Essential hypertension 05/13/2015  . Hyperlipidemia associated with type 2 diabetes mellitus ([place]) 05/13/2015  . Cough 05/13/2015     Objective:  BP (!) 104/58   York 87   Ht 5\' 8"  (1.727 m)   Wt 141 lb 2 oz (64 kg)   SpO2 100%   BMI 21.46 kg/m   Orthostatic VS for the past 24 hrs:  BP- Lying York- Lying BP-  Sitting York- Sitting BP- Standing at 0 minutes York- Standing at 0 minutes  09/25/19 1107 100/56 79 102/52 73 96/46 77    Vitals and nursing note reviewed  General: Appears well, no acute distress. Age appropriate. Sitting in the chair beside his wife. Cardiac: RRR, normal heart sounds, no murmurs Respiratory: CTAB, normal effort Abdomen: soft, nontender, nondistended Extremities: No edema or cyanosis. Skin: Warm and dry, no rashes noted Neuro: alert and oriented, no focal deficits Psych: normal affect  Assessment & Plan:    Essential hypertension Hypotensive today. This could very well be the cause of dizziness upon standing and the feeling of fatigue. Orthostatic vitals were normal but it is worth decreasing his anti-hypertensive agent for symptomatic relief. -Decrease lisinopril from 5mg  to 2.5mg   -f/u in 2 weeks for a repeat BP check and to discuss erectile dysfunction agents  Type 2 diabetes mellitus with diabetic retinopathy Uncontrolled with some improvement. A1c 10.8 today down from 11.8. Metformin 1000mg  BID. Lantus 35 units qam, 20 units qhs. Novolog 10 units TID.  - continue current regimen, consider adjustment at subsequent visit -Microalbumin/Cr ratio Urine -follow up in 2 weeks  Dizziness Acute. Orthostatic hypotension v. Anemia v. Medication induced hypotension v. Hypoglycemia. Normal orthostatic vitals. POCT hemoglobin 13.3 up from last hemoglobin in 06/24/2019 of 11.8. CBGs average 300s, patient asked to bring in readings at follow up visit to assess for the need of adjustment of insulin regimen. Is on  blood medication and dose could be too effective. Will reduce by half.  -reduce lisinopril to 2.5mg  -follow up in 2 weeks for BP check and symptoms with CBG readings -consider start or further discussing treatment for erectile dysfunction is acute problem resolved.    Gerlene Fee, [facility] PGY-1

## 2019-09-26 ENCOUNTER — Telehealth: Payer: Self-pay | Admitting: *Deleted

## 2019-09-26 DIAGNOSIS — R42 Dizziness and giddiness: Secondary | ICD-10-CM | POA: Insufficient documentation

## 2019-09-26 DIAGNOSIS — I10 Essential (primary) hypertension: Secondary | ICD-10-CM

## 2019-09-26 LAB — MICROALBUMIN / CREATININE URINE RATIO
Creatinine, Urine: 33 mg/dL
Microalb/Creat Ratio: 34 mg/g creat — ABNORMAL HIGH (ref 0–29)
Microalbumin, Urine: 11.1 ug/mL

## 2019-09-26 NOTE — Telephone Encounter (Signed)
Received fax from pharmacy requesting an Rx for the Lisinopril 2.5mg  qd, due to pt having trouble splitting the 5 mg pill. Terryl Niziolek Zimmerman Rumple, CMA

## 2019-09-26 NOTE — Assessment & Plan Note (Addendum)
Uncontrolled with some improvement. A1c 10.8 today down from 11.8. Metformin 1000mg  BID. Lantus 35 units qam, 20 units qhs. Novolog 10 units TID.  - continue current regimen, consider adjustment at subsequent visit -Microalbumin/Cr ratio Urine -follow up in 2 weeks

## 2019-09-26 NOTE — Assessment & Plan Note (Signed)
Acute. Orthostatic hypotension v. Anemia v. Medication induced hypotension v. Hypoglycemia. Normal orthostatic vitals. POCT hemoglobin 13.3 up from last hemoglobin in 06/24/2019 of 11.8. CBGs average 300s, patient asked to bring in readings at follow up visit to assess for the need of adjustment of insulin regimen. Is on blood medication and dose could be too effective. Will reduce by half.  -reduce lisinopril to 2.5mg  -follow up in 2 weeks for BP check and symptoms with CBG readings -consider start or further discussing treatment for erectile dysfunction is acute problem resolved.

## 2019-09-26 NOTE — Assessment & Plan Note (Signed)
Hypotensive today. This could very well be the cause of dizziness upon standing and the feeling of fatigue. Orthostatic vitals were normal but it is worth decreasing his anti-hypertensive agent for symptomatic relief. -Decrease lisinopril from 5mg  to 2.5mg   -f/u in 2 weeks for a repeat BP check and to discuss erectile dysfunction agents

## 2019-09-27 MED ORDER — LISINOPRIL 2.5 MG PO TABS: 2.5000 mg | ORAL_TABLET | Freq: Every day | ORAL | 0 refills | Status: DC

## 2019-09-30 ENCOUNTER — Other Ambulatory Visit: Payer: Self-pay

## 2019-09-30 DIAGNOSIS — M79605 Pain in left leg: Secondary | ICD-10-CM

## 2019-09-30 MED ORDER — GABAPENTIN 300 MG PO CAPS: ORAL_CAPSULE | ORAL | 3 refills | Status: DC

## 2019-09-30 NOTE — Progress Notes (Signed)
I have reviewed all the lab results. Alec York Urine microalbumin/creatinine ratio is moderately increase likely signifying acute kidney damage due to his uncontrolled diabetes called diabetic nephropathy. His average document glucose on prior blood work has been averaging in the 400s. A1c from 09/25/19 is 10.8.  He will likely need insulin medication adjustment and education at his next visit in the following 1-2 weeks.

## 2019-10-01 ENCOUNTER — Telehealth: Payer: Self-pay

## 2019-10-01 NOTE — Telephone Encounter (Signed)
Patient leaves VM on nurse line requesting his recent lab work to be mailed to his home. I printed the lab work from 11/4 and placed in mail pile.

## 2019-10-24 ENCOUNTER — Other Ambulatory Visit: Payer: Self-pay | Admitting: *Deleted

## 2019-10-24 DIAGNOSIS — C8590 Non-Hodgkin lymphoma, unspecified, unspecified site: Secondary | ICD-10-CM

## 2019-10-25 ENCOUNTER — Other Ambulatory Visit: Payer: Self-pay

## 2019-10-25 ENCOUNTER — Inpatient Hospital Stay: Payer: Medicare HMO | Attending: Hematology

## 2019-10-25 ENCOUNTER — Inpatient Hospital Stay (HOSPITAL_BASED_OUTPATIENT_CLINIC_OR_DEPARTMENT_OTHER): Payer: Medicare HMO | Admitting: Hematology

## 2019-10-25 VITALS — BP 123/70 | HR 86 | Temp 98.3°F | Resp 17 | Ht 68.0 in | Wt 141.6 lb

## 2019-10-25 DIAGNOSIS — C8590 Non-Hodgkin lymphoma, unspecified, unspecified site: Secondary | ICD-10-CM | POA: Diagnosis not present

## 2019-10-25 DIAGNOSIS — R5383 Other fatigue: Secondary | ICD-10-CM | POA: Diagnosis not present

## 2019-10-25 DIAGNOSIS — E1165 Type 2 diabetes mellitus with hyperglycemia: Secondary | ICD-10-CM | POA: Diagnosis not present

## 2019-10-25 DIAGNOSIS — C9110 Chronic lymphocytic leukemia of B-cell type not having achieved remission: Secondary | ICD-10-CM

## 2019-10-25 DIAGNOSIS — C8591 Non-Hodgkin lymphoma, unspecified, lymph nodes of head, face, and neck: Secondary | ICD-10-CM | POA: Insufficient documentation

## 2019-10-25 DIAGNOSIS — Z87891 Personal history of nicotine dependence: Secondary | ICD-10-CM | POA: Diagnosis not present

## 2019-10-25 DIAGNOSIS — E119 Type 2 diabetes mellitus without complications: Secondary | ICD-10-CM | POA: Diagnosis not present

## 2019-10-25 DIAGNOSIS — Z794 Long term (current) use of insulin: Secondary | ICD-10-CM | POA: Diagnosis not present

## 2019-10-25 LAB — CBC WITH DIFFERENTIAL (CANCER CENTER ONLY)
Abs Immature Granulocytes: 0.06 10*3/uL (ref 0.00–0.07)
Basophils Absolute: 0.1 10*3/uL (ref 0.0–0.1)
Basophils Relative: 0 %
Eosinophils Absolute: 0.3 10*3/uL (ref 0.0–0.5)
Eosinophils Relative: 1 %
HCT: 37.7 % — ABNORMAL LOW (ref 39.0–52.0)
Hemoglobin: 12.1 g/dL — ABNORMAL LOW (ref 13.0–17.0)
Immature Granulocytes: 0 %
Lymphocytes Relative: 64 %
Lymphs Abs: 14.1 10*3/uL — ABNORMAL HIGH (ref 0.7–4.0)
MCH: 25.9 pg — ABNORMAL LOW (ref 26.0–34.0)
MCHC: 32.1 g/dL (ref 30.0–36.0)
MCV: 80.7 fL (ref 80.0–100.0)
Monocytes Absolute: 1.3 10*3/uL — ABNORMAL HIGH (ref 0.1–1.0)
Monocytes Relative: 6 %
Neutro Abs: 6.6 10*3/uL (ref 1.7–7.7)
Neutrophils Relative %: 29 %
Platelet Count: 213 10*3/uL (ref 150–400)
RBC: 4.67 MIL/uL (ref 4.22–5.81)
RDW: 14.4 % (ref 11.5–15.5)
WBC Count: 22.5 10*3/uL — ABNORMAL HIGH (ref 4.0–10.5)
nRBC: 0 % (ref 0.0–0.2)

## 2019-10-25 LAB — CMP (CANCER CENTER ONLY)
ALT: 15 U/L (ref 0–44)
AST: 16 U/L (ref 15–41)
Albumin: 3.7 g/dL (ref 3.5–5.0)
Alkaline Phosphatase: 107 U/L (ref 38–126)
Anion gap: 10 (ref 5–15)
BUN: 15 mg/dL (ref 8–23)
CO2: 27 mmol/L (ref 22–32)
Calcium: 9.4 mg/dL (ref 8.9–10.3)
Chloride: 98 mmol/L (ref 98–111)
Creatinine: 1.33 mg/dL — ABNORMAL HIGH (ref 0.61–1.24)
GFR, Est AFR Am: >60 mL/min (ref >60)
GFR, Estimated: 55 mL/min — ABNORMAL LOW (ref >60)
Glucose, Bld: 399 mg/dL — ABNORMAL HIGH (ref 70–99)
Potassium: 5.2 mmol/L — ABNORMAL HIGH (ref 3.5–5.1)
Sodium: 135 mmol/L (ref 135–145)
Total Bilirubin: 0.4 mg/dL (ref 0.3–1.2)
Total Protein: 7.4 g/dL (ref 6.5–8.1)

## 2019-10-25 LAB — LACTATE DEHYDROGENASE: LDH: 153 U/L (ref 98–192)

## 2019-10-25 NOTE — Progress Notes (Signed)
HEMATOLOGY/ONCOLOGY CLNIC NOTE  Date of Service: 10/25/2019  Patient Care Team: Sherene Sires, DO as PCP - General (Family Medicine)  Melissa Montane, MD as ENT  CHIEF COMPLAINTS/PURPOSE OF CONSULTATION:  Non-Hodgkin's Lymphoma   HISTORY OF PRESENTING ILLNESS:   Alec York is a wonderful 68 y.o. male who has been referred to [place] by ENT Dr. Melissa Montane for evaluation and management of Non-Hodgkin's Lymphoma. He is accompanied today by his wife. The pt reports that he is doing well overall.   The pt had a Fine needle aspiration of the left neck mass on 05/16/18 which confirmed a B-cell Non-Hodgkin's lymphoma.   The pt reports well controlled DM and HTN. He denies neuropathy in his hands but this sometimes occurs in his legs. He also endorses vision changes related to his DM and he takes Lantus and Metformin. He denies heart or kidney problems, or previous surgeries. He notes that his PCP Dr. Wendee Beavers manages his DM.   He first noticed the right neck mass about 3 months ago and describes that the appearance was sudden. He denies any other lumps or bumps, fevers, chills, night sweats, unexpected weight loss, and change in appetite.   Of note prior to the patient's visit today, pt has had US Soft Tissue Head/Neck completed on 03/23/18 with results revealing Multiple well-circumscribed neck lesions are identified, consistent with lymph nodes, the largest of which measures 2.1 x 1.5 x 2.3 cm. Benign behavior is not established. Bulky adenopathy such as this could relate to lymphoma, or metastatic squamous cell carcinoma. There is no visible extranodal spread of tumor.   Most recent lab results (03/22/18) of CBC w/diff is as follows: all values are WNL except for WBC at 12.8k, HGB at 12.3, Lymphs abs at 6.3k.  On review of systems, pt reports left neck mass, eating well, and denies fevers, chills, night sweats, unexpected weight loss, noticing any other lumps or bumps, pain along the spine,  abdominal pains, leg swelling, testicular pain or swelling, skin rashes, and any other symptoms.   On Social Hx the pt reports that he quit smoking cigarettes 10 years ago, and had been smoking 5-6 cigarettes each day. He denies drinking any ETOH.  On Family Hx the pt denies cancer.   Interval History:   Alec York returns today for management and evaluation of his Non-Hodgkin's Lymphoma. He is accompanied by his wife. The patient's last visit with [place] was on 06/24/2019. The pt reports that he is doing well overall.  The pt reports he is doing well but he is concerned about his sugar levels. His DM2 is not well controlled and we discussed that it could contribute to significant fatigue and other complications.  He has some fatigue. No fevers/chills/nightsweats.   Lab results today (10/25/19) of CBC w/diff and CMP is as follows: all values are WNL except for WBC Count at 22.5, Hemoglobin at 12.1, HCT at 37.7, MCH at 25.9, Potassium at 5.2, Glucose Bld at 399, Creatinine at 1.33, GFR, Est Non Af Am at 55, LDH 153.  On review of systems, pt reports doing better and denies fevers, chills, night sweets, abdominal pain, stable weight, abdominal pain, neck pain, tingling or numbness, leg swelling and any other symptoms.   MEDICAL HISTORY:  Past Medical History:  Diagnosis Date  . Allergy   . Diabetes mellitus without complication ([place])   . Diabetic retinopathy ([place])    Disabled 2/2 retinopathy  . GERD (gastroesophageal reflux disease)   .  Hyperlipidemia   . Hypertension     SURGICAL HISTORY: No past surgical history on file.  SOCIAL HISTORY: Social History   Socioeconomic History  . Marital status: Married    Spouse name: Not on file  . Number of children: Not on file  . Years of education: Not on file  . Highest education level: Not on file  Occupational History  . Not on file  Social Needs  . Financial resource strain: Not on file  . Food insecurity    Worry: Not on  file    Inability: Not on file  . Transportation needs    Medical: Not on file    Non-medical: Not on file  Tobacco Use  . Smoking status: Former Research scientist (life sciences)  . Smokeless tobacco: Never Used  Substance and Sexual Activity  . Alcohol use: No    Alcohol/week: 0.0 standard drinks  . Drug use: No  . Sexual activity: Yes    Birth control/protection: None  Lifestyle  . Physical activity    Days per week: Not on file    Minutes per session: Not on file  . Stress: Not on file  Relationships  . Social Herbalist on phone: Not on file    Gets together: Not on file    Attends religious service: Not on file    Active member of club or organization: Not on file    Attends meetings of clubs or organizations: Not on file    Relationship status: Not on file  . Intimate partner violence    Fear of current or ex partner: Not on file    Emotionally abused: Not on file    Physically abused: Not on file    Forced sexual activity: Not on file  Other Topics Concern  . Not on file  Social History Narrative  . Not on file    FAMILY HISTORY: Family History  Problem Relation Age of Onset  . Diabetes Neg Hx     ALLERGIES:  has No Known Allergies.  MEDICATIONS:  Current Outpatient Medications  Medication Sig Dispense Refill  . Accu-Chek FastClix Lancets MISC Use to test sugars up to 4 times daily.  Dx Code: E11.319 102 each 12  . aspirin 81 MG tablet Take 81 mg by mouth daily.    [name] atorvastatin (LIPITOR) 40 MG tablet Take 1 tablet (40 mg total) by mouth daily. 90 tablet 3  . benzonatate (TESSALON) 100 MG capsule Take 1 capsule (100 mg total) by mouth 2 (two) times daily as needed for cough. 20 capsule 0  . blood glucose meter kit and supplies KIT Dispense based on patient and insurance preference. Use up to four times daily as directed. Dx E11.319 1 each 0  . Blood Glucose Monitoring Suppl (ACCU-CHEK GUIDE) w/Device KIT 1 each by Does not apply route 4 (four) times daily as needed. Use  to test sugars up to 4 times daily.  Dx Code: E11.319 1 kit 0  . gabapentin (NEURONTIN) 300 MG capsule Take 2 capsules (600 mg total) by mouth at bedtime AND 1 capsule (300 mg total) daily with breakfast AND 1 capsule (300 mg total) daily after lunch. You may take every 8 hours.. 360 capsule 3  . glucagon 1 MG injection Inject 1 mg into the muscle once as needed. May repeat in 15 minutes if needed. 1 each 12  . glucose blood (ACCU-CHEK GUIDE) test strip Use to test sugars up to 4 times daily.  Dx Code: Z99.357  200 each 12  . Insulin Glargine (LANTUS) 100 UNIT/ML Solostar Pen Inject 35 Units into the skin every morning AND 20 Units at bedtime. 15 mL 11  . Lancet Devices (ACCU-CHEK SOFTCLIX) lancets Use as instructed up to 4 times daily.  Dx E11.319 400 each 3  . Lancets Misc. (ACCU-CHEK FASTCLIX LANCET) KIT Use to test sugars up to 4 times daily.  Dx Code: E11.319 1 kit 0  . lisinopril (ZESTRIL) 2.5 MG tablet Take 1 tablet (2.5 mg total) by mouth daily. 30 tablet 0  . loratadine (CLARITIN) 10 MG tablet Take 1 tablet (10 mg total) by mouth daily. 30 tablet 1  . metFORMIN (GLUCOPHAGE) 1000 MG tablet Take 1 tablet (1,000 mg total) by mouth 2 (two) times daily with a meal. 180 tablet 3  . NOVOLOG FLEXPEN 100 UNIT/ML FlexPen Inject 10 Units into the skin 3 (three) times daily with meals. 15 mL 11  . RELION PEN NEEDLES 31G X 6 MM MISC USE AS DIRECTED TO  CHECK  BLOOD  SUGAR 50 each 23   No current facility-administered medications for this visit.     REVIEW OF SYSTEMS:   A 10+ POINT REVIEW OF SYSTEMS WAS OBTAINED including neurology, dermatology, psychiatry, cardiac, respiratory, lymph, extremities, GI, GU, Musculoskeletal, constitutional, breasts, reproductive, HEENT.  All pertinent positives are noted in the HPI.  All others are negative.     PHYSICAL EXAMINATION: ECOG FS:1 - Symptomatic but completely ambulatory  Vitals:   10/25/19 0932  BP: 123/70  Pulse: 86  Resp: 17  Temp: 98.3 F (36.8  C)  SpO2: 100%   Wt Readings from Last 3 Encounters:  09/25/19 141 lb 2 oz (64 kg)  06/24/19 144 lb 4.8 oz (65.5 kg)  04/05/19 134 lb 3.2 oz (60.9 kg)   Body mass index is 21.53 kg/m.    GENERAL:alert, in no acute distress and comfortable SKIN: no acute rashes, no significant lesions EYES: conjunctiva are pink and non-injected, sclera anicteric OROPHARYNX: MMM, no exudates, no oropharyngeal erythema or ulceration NECK: supple, no JVD   LYMPH:  2-3cm LN at the left angle of the jaw, unchanged. No palpable lymphadenopathy in the axillary or inguinal regions LUNGS: clear to auscultation b/l with normal respiratory effort HEART: regular rate & rhythm ABDOMEN:  normoactive bowel sounds , non tender, not distended. Extremity: no pedal edema PSYCH: alert & oriented x 3 with fluent speech NEURO: no focal motor/sensory deficits   LABORATORY DATA:  I have reviewed the data as listed  . CBC Latest Ref Rng & Units 10/25/2019 09/25/2019 06/24/2019  WBC 4.0 - 10.5 K/uL 22.5(H) - 15.4(H)  Hemoglobin 13.0 - 17.0 g/dL 12.1(L) 13.3 11.8(L)  Hematocrit 39.0 - 52.0 % 37.7(L) - 37.7(L)  Platelets 150 - 400 K/uL 213 - 205    . CMP Latest Ref Rng & Units 10/25/2019 06/24/2019 04/05/2019  Glucose 70 - 99 mg/dL 399(H) 399(H) 353(H)  BUN 8 - 23 mg/dL 15 13 14   Creatinine 0.61 - 1.24 mg/dL 1.33(H) 1.14 0.98  Sodium 135 - 145 mmol/L 135 134(L) 133(L)  Potassium 3.5 - 5.1 mmol/L 5.2(H) 5.1 5.4(H)  Chloride 98 - 111 mmol/L 98 101 98  CO2 22 - 32 mmol/L 27 24 23   Calcium 8.9 - 10.3 mg/dL 9.4 9.1 9.4  Total Protein 6.5 - 8.1 g/dL 7.4 7.0 -  Total Bilirubin 0.3 - 1.2 mg/dL 0.4 0.3 -  Alkaline Phos 38 - 126 U/L 107 94 -  AST 15 - 41 U/L 16 17 -  ALT 0 - 44 U/L 15 15 -   06/18/18 Right Cervical LN Needle/core Biopsy:    05/23/18 Fine Needle Aspiration Flow Cytometry:    RADIOGRAPHIC STUDIES: I have personally reviewed the radiological images as listed and agreed with the findings in the report. No  results found.  ASSESSMENT & PLAN:  68 y.o. male with  1. 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  05/16/18 Flow cytometry results which revealed NHL B-Cell lymphoma, and discussed that this is not completely diagnostic   03/23/18 US Soft Tissue Head/Neck revealed Multiple well-circumscribed neck lesions are identified, consistent with lymph nodes, the largest of which measures 2.1 x 1.5 x 2.3 cm. Benign behavior is not established. Bulky adenopathy such as this could relate to lymphoma, or metastatic squamous cell carcinoma. There is no visible extranodal spread of tumor.   06/18/18 biopsy favored a low grade Marginal Zone Lymphoma 06/15/18 PET/CT revealed Enlarged and hypermetabolic lymph nodes involving the neck, axilla, subpectoral and mediastinal lymph nodes. No lymphadenopathy below the diaphragm. No findings for osseous lymphoma  06/07/18 ECHO for treatment planning revealed a normal ejection fraction 2.  Patient Active Problem List   Diagnosis Date Noted  . Dizziness 09/26/2019  . Dysphagia 04/16/2019  . Lymphadenopathy 04/12/2018  . Cervical lymphadenopathy 03/22/2018  . Right leg swelling 10/19/2017  . PAD (peripheral artery disease) ([place]) 07/07/2017  . Neuropathic pain 07/07/2017  . Knee pain, chronic 06/15/2017  . Nonspecific abnormal electrocardiogram (ECG) (EKG) 09/02/2015  . Adhesive capsulitis of right shoulder 08/19/2015  . Bursitis of right shoulder 06/01/2015  . Allergic rhinitis 06/01/2015  . Acid reflux 05/13/2015  . Type 2 diabetes mellitus with diabetic retinopathy ([place]) 05/13/2015  . Essential hypertension 05/13/2015  . Hyperlipidemia associated with type 2 diabetes mellitus ([place]) 05/13/2015  . Cough 05/13/2015   F/u with PCP for mx esp for uncontrolled DM2 PLAN: -Discussed pt labwork today, 10/25/19; all values are WNL except for WBC Count at 22.5, Hemoglobin at 12.1, HCT at 37.7, MCH at 25.9,  Potassium at 5.2, Glucose Bld at 399, Creatinine at 1.33, GFR, Est Non Af Am at 55, LDH 153. -Discussed 10/25/19 WBC count at 22.5. Has increased -Discussed that pt has a slow growing cancer and will continue to monitor  -Discussed 10/25/19 Hemoglobin at 12.1 -Discussed 10/25/19 Platelet count at 213 -Discussed 10/25/19 WBC count at 22.5 -Discussed 10/25/19 LDH at 153 -Discussed following up with PCP  for diabetes.optimization. He is aware that a rpt PET/CT will not be possible if he has uncontrolled hyperglycemia. -Discussed chemotherapy and immunotherapy options - no acute indication to treat his nodal marginal zone lymphoma at this time.  FOLLOW UP: PET/CT in 16 weeks RTC with Dr Irene Limbo with labs in 4 months  The total time spent in the appt was 25 minutes and more than 50% was on counseling and direct patient cares.  All of the patient's questions were answered with apparent satisfaction. The patient knows to call the clinic with any problems, questions or concerns. Sullivan Lone MD [place] AAHIVMS [facility] Hematology/Oncology Physician [facility]  (Office):       [phone] (Work cell):  [phone] (Fax):           [phone]  10/25/2019 7:00 AM  I, Scot Dock, am acting as a scribe for Dr. Sullivan Lone.   .I have reviewed the above documentation for accuracy and completeness, and I agree with the above. Suzan Slick  Juleen China MD

## 2019-10-28 ENCOUNTER — Telehealth: Payer: Self-pay | Admitting: Hematology

## 2019-10-28 NOTE — Telephone Encounter (Signed)
Scheduled appt per 12/4 los.  Spoke with pt and he is aware of his appt date and time.

## 2019-11-04 ENCOUNTER — Other Ambulatory Visit: Payer: Self-pay

## 2019-11-04 DIAGNOSIS — M79605 Pain in left leg: Secondary | ICD-10-CM

## 2019-11-06 ENCOUNTER — Other Ambulatory Visit: Payer: Self-pay | Admitting: Family Medicine

## 2019-11-06 DIAGNOSIS — E11319 Type 2 diabetes mellitus with unspecified diabetic retinopathy without macular edema: Secondary | ICD-10-CM

## 2019-12-10 ENCOUNTER — Ambulatory Visit (INDEPENDENT_AMBULATORY_CARE_PROVIDER_SITE_OTHER): Payer: Medicare HMO | Admitting: Family Medicine

## 2019-12-10 ENCOUNTER — Other Ambulatory Visit: Payer: Self-pay

## 2019-12-10 VITALS — BP 100/52 | HR 98 | Wt 140.4 lb

## 2019-12-10 DIAGNOSIS — E1169 Type 2 diabetes mellitus with other specified complication: Secondary | ICD-10-CM

## 2019-12-10 DIAGNOSIS — E785 Hyperlipidemia, unspecified: Secondary | ICD-10-CM

## 2019-12-10 DIAGNOSIS — E1142 Type 2 diabetes mellitus with diabetic polyneuropathy: Secondary | ICD-10-CM

## 2019-12-10 DIAGNOSIS — E11311 Type 2 diabetes mellitus with unspecified diabetic retinopathy with macular edema: Secondary | ICD-10-CM

## 2019-12-10 DIAGNOSIS — M79605 Pain in left leg: Secondary | ICD-10-CM

## 2019-12-10 DIAGNOSIS — I10 Essential (primary) hypertension: Secondary | ICD-10-CM | POA: Diagnosis not present

## 2019-12-10 DIAGNOSIS — Z794 Long term (current) use of insulin: Secondary | ICD-10-CM

## 2019-12-10 DIAGNOSIS — N1832 Chronic kidney disease, stage 3b: Secondary | ICD-10-CM

## 2019-12-10 DIAGNOSIS — R6889 Other general symptoms and signs: Secondary | ICD-10-CM | POA: Diagnosis not present

## 2019-12-10 MED ORDER — CANAGLIFLOZIN 100 MG PO TABS: 100.0000 mg | ORAL_TABLET | Freq: Every day | ORAL | 0 refills | Status: DC

## 2019-12-10 MED ORDER — GABAPENTIN 300 MG PO CAPS: 300.0000 mg | ORAL_CAPSULE | Freq: Two times a day (BID) | ORAL | 0 refills | Status: DC

## 2019-12-10 MED ORDER — BLOOD PRESSURE CUFF MISC: 1.0000 [IU] | Freq: Every day | 0 refills | Status: DC

## 2019-12-10 NOTE — Assessment & Plan Note (Signed)
Starting gabapentin 300 mg twice daily with increased to 300 mg TID for symptoms.  See diabetes note.

## 2019-12-10 NOTE — Assessment & Plan Note (Signed)
Starting patient on Invokana today.  I believe his bilateral lower leg itching is likely a symptom of diabetic neuropathy.  His last A1c was 10.8 at his last visit in November.  Patient has had history of poor control and poor medication compliance.  Diabetic foot exam normal and ABIs normal today.  Will restart gabapentin 300 mg twice daily.  Patient can increase to TID for symptoms.   Follow-up with patient in 2 weeks. Patient agreeable to measuring blood sugar every morning and will bring in calendar to next visit. Continue other regular diabetic medications

## 2019-12-10 NOTE — Patient Instructions (Addendum)
Dear Mauri [place],   It was good to see you! Thank you for taking your time to come in to be seen. Today, we discussed the following:   Leg itching  I believe this is due to diabetic neuropathy.  I want to continue your gabapentin as you are taking it previously.  Additionally, I am adding a medication to your diabetes regimen.  Hopefully, this will improve your high blood sugars.  Take gabapentin 2 times a day for the first 4 to 5 days.  Then you can increase to 3 times a day depending on your symptoms.  We are also starting a medication called Invokana.  Please take 100 mg (1 tablet) every morning.  Please make a log of your morning blood sugars before eating or drinking every morning.  Please follow-up in 2 weeks to adjust medications  Continue to take your Metformin, Lantus, NovoLog as prescribed.  Be well,   Zettie Cooley, M.D   [facility] [phone]  *Sign up for MyChart for instant access to your health profile, labs, orders, upcoming appointments or to contact your provider with questions*  ===================================================================================

## 2019-12-10 NOTE — Assessment & Plan Note (Signed)
Consider increasing atorvastatin to 80 mg at next visit.  We will hold off on making changes today as changes in diabetic medications.

## 2019-12-10 NOTE — Progress Notes (Signed)
  Subjective  Alec York is a 69 y.o. male who presents today with the following problems:  Leg Itching  Itchhing worse at night over the last 4-5 months. It happens with different types of food, but he cannot identify which.  Patient reports that he was previously taking gabapentin and that this helped with his symptoms.  He reports compliance with his diabetic medications and reports the following medications: Lantus 60 units 30 in afternoon and 30 in night time, NovoLog 10 units 3 times daily with meals  Objective  Physical Exam BP (!) 100/52   Pulse 98   Wt 140 lb 6.4 oz (63.7 kg)   SpO2 100%   BMI 21.35 kg/m  General: Thin male.  Nontoxic Lower extremities.  Anterior shins with no hair and appears shiny.  Skin appears dry and surrounding areas.  0+ dorsalis pedis pulses.  1+ posterior tibial bilaterally. Diabetic foot exam: Patient has full sensation in both feet bilaterally.  There is a small lateral callus on the left distal fifth metatarsal.  No other obvious lesions. ABI  Left: 1.13, Right: 1.1 135/69, pulse 82  Assessment & Plan    Problem List Items Addressed This Visit      Active Problems   Type 2 diabetes mellitus with diabetic retinopathy ([place]) (Chronic)    Starting patient on Invokana today.  I believe his bilateral lower leg itching is likely a symptom of diabetic neuropathy.  His last A1c was 10.8 at his last visit in November.  Patient has had history of poor control and poor medication compliance.  Diabetic foot exam normal and ABIs normal today.  Will restart gabapentin 300 mg twice daily.  Patient can increase to TID for symptoms.   Follow-up with patient in 2 weeks. Patient agreeable to measuring blood sugar every morning and will bring in calendar to next visit. Continue other regular diabetic medications      Essential hypertension (Chronic)    Currently on lisinopril. Pt asks for BP cuff to use at home. Rx printed.      Hyperlipidemia associated  with type 2 diabetes mellitus ([place]) - Primary (Chronic)   Diabetic peripheral neuropathy associated with type 2 diabetes mellitus ([facility])    Starting gabapentin 300 mg twice daily with increased to 300 mg TID for symptoms.  See diabetes note.         Wilber Oliphant, M.D.  2:24 PM 12/10/2019

## 2019-12-10 NOTE — Assessment & Plan Note (Signed)
Currently on lisinopril. Pt asks for BP cuff to use at home. Rx printed.

## 2019-12-31 DIAGNOSIS — R6889 Other general symptoms and signs: Secondary | ICD-10-CM | POA: Diagnosis not present

## 2019-12-31 DIAGNOSIS — E113591 Type 2 diabetes mellitus with proliferative diabetic retinopathy without macular edema, right eye: Secondary | ICD-10-CM | POA: Diagnosis not present

## 2019-12-31 DIAGNOSIS — E113512 Type 2 diabetes mellitus with proliferative diabetic retinopathy with macular edema, left eye: Secondary | ICD-10-CM | POA: Diagnosis not present

## 2019-12-31 DIAGNOSIS — H3582 Retinal ischemia: Secondary | ICD-10-CM | POA: Diagnosis not present

## 2020-01-08 DIAGNOSIS — R6889 Other general symptoms and signs: Secondary | ICD-10-CM | POA: Diagnosis not present

## 2020-01-10 ENCOUNTER — Other Ambulatory Visit: Payer: Self-pay

## 2020-01-10 ENCOUNTER — Ambulatory Visit (INDEPENDENT_AMBULATORY_CARE_PROVIDER_SITE_OTHER): Payer: Medicare HMO | Admitting: Family Medicine

## 2020-01-10 ENCOUNTER — Other Ambulatory Visit: Payer: Self-pay | Admitting: *Deleted

## 2020-01-10 ENCOUNTER — Ambulatory Visit: Payer: Medicare HMO | Admitting: Family Medicine

## 2020-01-10 VITALS — BP 104/58 | HR 87 | Ht 68.0 in | Wt 142.0 lb

## 2020-01-10 DIAGNOSIS — E11319 Type 2 diabetes mellitus with unspecified diabetic retinopathy without macular edema: Secondary | ICD-10-CM

## 2020-01-10 DIAGNOSIS — E785 Hyperlipidemia, unspecified: Secondary | ICD-10-CM

## 2020-01-10 DIAGNOSIS — N529 Male erectile dysfunction, unspecified: Secondary | ICD-10-CM

## 2020-01-10 DIAGNOSIS — E1169 Type 2 diabetes mellitus with other specified complication: Secondary | ICD-10-CM

## 2020-01-10 DIAGNOSIS — M79605 Pain in left leg: Secondary | ICD-10-CM

## 2020-01-10 DIAGNOSIS — E11311 Type 2 diabetes mellitus with unspecified diabetic retinopathy with macular edema: Secondary | ICD-10-CM

## 2020-01-10 DIAGNOSIS — E1142 Type 2 diabetes mellitus with diabetic polyneuropathy: Secondary | ICD-10-CM

## 2020-01-10 DIAGNOSIS — I10 Essential (primary) hypertension: Secondary | ICD-10-CM

## 2020-01-10 DIAGNOSIS — R6889 Other general symptoms and signs: Secondary | ICD-10-CM | POA: Diagnosis not present

## 2020-01-10 DIAGNOSIS — Z794 Long term (current) use of insulin: Secondary | ICD-10-CM | POA: Diagnosis not present

## 2020-01-10 LAB — POCT GLYCOSYLATED HEMOGLOBIN (HGB A1C): HbA1c, POC (controlled diabetic range): 10.3 % — AB (ref 0.0–7.0)

## 2020-01-10 MED ORDER — GABAPENTIN 300 MG PO CAPS: 300.0000 mg | ORAL_CAPSULE | Freq: Three times a day (TID) | ORAL | 0 refills | Status: DC

## 2020-01-10 MED ORDER — LISINOPRIL 2.5 MG PO TABS: 2.5000 mg | ORAL_TABLET | Freq: Every day | ORAL | 1 refills | Status: DC

## 2020-01-10 MED ORDER — INSULIN GLARGINE 100 UNIT/ML SOLOSTAR PEN: 35.0000 [IU] | PEN_INJECTOR | Freq: Two times a day (BID) | SUBCUTANEOUS | 11 refills | Status: DC

## 2020-01-10 MED ORDER — SILDENAFIL CITRATE 50 MG PO TABS: 25.0000 mg | ORAL_TABLET | ORAL | 1 refills | Status: DC | PRN

## 2020-01-10 NOTE — Assessment & Plan Note (Addendum)
Slight decrease in A1c today.  Previously 10.8, now 10.3. -Continue Metformin 1000 mg twice daily -Continue Invokana daily -Increase Lantus to 35 units twice daily -Continue aspart 30 units daily -Follow-up in 3 months -Consider working with Dr. Valentina Lucks if A1c remains persistently elevated

## 2020-01-10 NOTE — Assessment & Plan Note (Signed)
He was informed that topical steroids are helpful for symptoms of itching but this would not be the best long-term medication for him.  He was encouraged to use gabapentin and increase as needed. -Increase gabapentin to 300 mg 3 times daily.  Increase gabapentin as needed to 600 mg 3 times daily.  Use the minimum dose necessary at alleviate neuropathic symptoms.

## 2020-01-10 NOTE — Patient Instructions (Signed)
Is a quick summary of the things we talked about today:  Leg pain/itching: Increase your gabapentin to 1 pill 3 times a day.  If this does not help, you can continue to increase this medication to 2 pills 3 times a day.  Try to use the lowest dose that is helpful to take away her leg pain/itching.  Diabetes: Your diabetes is a tiny bit better today although there is still much room for improvement.  Lets increase your Lantus (long-acting) insulin to 35 units in the morning and 35 units in the evening.  Please return to clinic in 3 months for your next diabetes appointment.  Erectile dysfunction: This is a normal problem as we age.  It is probably related to your diabetes.  We will start you on a low dose of Viagra.  If 1 pill does not successfully give you an erection, it is okay to increase to 2 pills.  Do not use this medication if you have chest pain.  Remember, you need to be seen in the ED if you have an erection lasting longer than 4 hours.

## 2020-01-10 NOTE — Assessment & Plan Note (Signed)
He was informed this is likely due to his poorly controlled diabetes and other vascular issues including hypertension and PAD.  He has known CAD. -Sildenafil 25 mg as needed.  May increase to 50 mg if 25 mg is not effective. -Informed to be seen in the ED for erections lasting longer than 4 hours.  Informed not to use if he experienced chest pain or lightheadedness, dizziness.

## 2020-01-10 NOTE — Progress Notes (Signed)
   CHIEF COMPLAINT / HPI:  LE neuropathy Persistent for years.  Primary symptom is pain tingling/itchy sensation.  He has previously been prescribed gabapentin twice daily with instructions to increase as needed.  He has been continue taking gabapentin 300 mg twice daily without significant improvement.  In addition to his gabapentin, he has been applying a topical steroid and wants to know if he can have refills of his topical steroid to help with the itching/tingling.  Diabetes His current diabetes regimen includes Metformin 1000 BID, canagliflozin 100 mg daily, lantus 30  BID, Aspart 10 TID or 15 BID.  No excessive thirst or excessive urination.  No nausea, vomiting.  He reports that he was seen by his ophthalmologist 1 month ago when it an eye exam was performed.  Erectile dysfunction Reports that he has had difficulty maintaining erection can be done.  He has never previously taken any medication physician.  He denies significant cardiac history.   PERTINENT  PMH / PSH: Type 2 diabetes, PAD   OBJECTIVE: BP (!) 104/58   Pulse 87   Ht 5\' 8"  (1.727 m)   Wt 142 lb (64.4 kg)   SpO2 99%   BMI 21.59 kg/m    General: Well-appearing 69 year old man.  No acute distress Skin: Lower extremities without evidence of edema.  Some mild skin changes including splinting and thinning of the skin.  No evidence of ulceration or rashes.  ASSESSMENT / PLAN:  Diabetic peripheral neuropathy associated with type 2 diabetes mellitus ([place]) He was informed that topical steroids are helpful for symptoms of itching but this would not be the best long-term medication for him.  He was encouraged to use gabapentin and increase as needed. -Increase gabapentin to 300 mg 3 times daily.  Increase gabapentin as needed to 600 mg 3 times daily.  Use the minimum dose necessary at alleviate neuropathic symptoms.  Erectile dysfunction He was informed this is likely due to his poorly controlled diabetes and other  vascular issues including hypertension and PAD.  He has known CAD. -Sildenafil 25 mg as needed.  May increase to 50 mg if 25 mg is not effective. -Informed to be seen in the ED for erections lasting longer than 4 hours.  Informed not to use if he experienced chest pain or lightheadedness, dizziness.  Type 2 diabetes mellitus with diabetic retinopathy Slight decrease in A1c today.  Previously 10.8, now 10.3. -Continue Metformin 1000 mg twice daily -Continue Invokana daily -Increase Lantus to 35 units twice daily -Continue aspart 30 units daily -Follow-up in 3 months -Consider working with Dr. Valentina Lucks if A1c remains persistently elevated    Matilde Haymaker, MD [place]

## 2020-01-10 NOTE — Telephone Encounter (Signed)
Pharmacy requesting refill of a 90 day supply of Zestril to help improve pts adherence.

## 2020-01-19 ENCOUNTER — Ambulatory Visit: Payer: Medicare HMO | Attending: Internal Medicine

## 2020-01-19 DIAGNOSIS — Z23 Encounter for immunization: Secondary | ICD-10-CM | POA: Insufficient documentation

## 2020-01-19 NOTE — Progress Notes (Signed)
   Covid-19 Vaccination Clinic  Name:  Alec York    MRN: [id number] DOB: [birth year]  01/19/2020  [name] was observed post Covid-19 immunization for 15 minutes without incidence. He was provided with Vaccine Information Sheet and instruction to access the V-Safe system.   [name] was instructed to call 911 with any severe reactions post vaccine: [name] Difficulty breathing  . Swelling of your face and throat  . A fast heartbeat  . A bad rash all over your body  . Dizziness and weakness    Immunizations Administered    Name Date Dose VIS Date Route   Pfizer COVID-19 Vaccine 01/19/2020  9:24 AM 0.3 mL 11/01/2019 Intramuscular   Manufacturer: [place]   Lot: [id number]   [place]: [id number]

## 2020-02-04 ENCOUNTER — Other Ambulatory Visit: Payer: Self-pay | Admitting: Family Medicine

## 2020-02-04 DIAGNOSIS — E11319 Type 2 diabetes mellitus with unspecified diabetic retinopathy without macular edema: Secondary | ICD-10-CM

## 2020-02-07 DIAGNOSIS — H5203 Hypermetropia, bilateral: Secondary | ICD-10-CM | POA: Diagnosis not present

## 2020-02-07 DIAGNOSIS — R6889 Other general symptoms and signs: Secondary | ICD-10-CM | POA: Diagnosis not present

## 2020-02-12 ENCOUNTER — Ambulatory Visit: Payer: Medicare HMO | Attending: Internal Medicine

## 2020-02-12 DIAGNOSIS — Z23 Encounter for immunization: Secondary | ICD-10-CM

## 2020-02-12 NOTE — Progress Notes (Signed)
   Covid-19 Vaccination Clinic  Name:  Alec York    MRN: [id number] DOB: [birth year]  02/12/2020  Mr. [name] was observed post Covid-19 immunization for 15 minutes without incident. He was provided with Vaccine Information Sheet and instruction to access the V-Safe system.   Mr. [name] was instructed to call 911 with any severe reactions post vaccine: [name] Difficulty breathing  . Swelling of face and throat  . A fast heartbeat  . A bad rash all over body  . Dizziness and weakness   Immunizations Administered    Name Date Dose VIS Date Route   Pfizer COVID-19 Vaccine 02/12/2020 11:28 AM 0.3 mL 11/01/2019 Intramuscular   Manufacturer: [place]   Lot: [id number]   [place]: [id number]

## 2020-02-23 NOTE — Progress Notes (Signed)
  HEMATOLOGY/ONCOLOGY CLNIC NOTE  Date of Service: 02/24/2020  Patient Care Team: Meccariello, Bailey J, DO as PCP - General (Family Medicine)  Byers, John, MD as ENT  CHIEF COMPLAINTS/PURPOSE OF CONSULTATION:  Non-Hodgkin's Lymphoma   HISTORY OF PRESENTING ILLNESS:   Alec York is a wonderful 69 y.o. male who has been referred to us by ENT Dr. John Byers for evaluation and management of Non-Hodgkin's Lymphoma. He is accompanied today by his wife. The pt reports that he is doing well overall.   The pt had a Fine needle aspiration of the left neck mass on 05/16/18 which confirmed a B-cell Non-Hodgkin's lymphoma.   The pt reports well controlled DM and HTN. He denies neuropathy in his hands but this sometimes occurs in his legs. He also endorses vision changes related to his DM and he takes Lantus and Metformin. He denies heart or kidney problems, or previous surgeries. He notes that his PCP Dr. Taye Gonfa manages his DM.   He first noticed the right neck mass about 3 months ago and describes that the appearance was sudden. He denies any other lumps or bumps, fevers, chills, night sweats, unexpected weight loss, and change in appetite.   Of note prior to the patient's visit today, pt has had US Soft Tissue Head/Neck completed on 03/23/18 with results revealing Multiple well-circumscribed neck lesions are identified, consistent with lymph nodes, the largest of which measures 2.1 x 1.5 x 2.3 cm. Benign behavior is not established. Bulky adenopathy such as this could relate to lymphoma, or metastatic squamous cell carcinoma. There is no visible extranodal spread of tumor.   Most recent lab results (03/22/18) of CBC w/diff is as follows: all values are WNL except for WBC at 12.8k, HGB at 12.3, Lymphs abs at 6.3k.  On review of systems, pt reports left neck mass, eating well, and denies fevers, chills, night sweats, unexpected weight loss, noticing any other lumps or bumps, pain along the  spine, abdominal pains, leg swelling, testicular pain or swelling, skin rashes, and any other symptoms.   On Social Hx the pt reports that he quit smoking cigarettes 10 years ago, and had been smoking 5-6 cigarettes each day. He denies drinking any ETOH.  On Family Hx the pt denies cancer.   Interval History:   Alec York returns today for management and evaluation of his Non-Hodgkin's Lymphoma. The patient's last visit with us was on 10/25/2019. The pt reports that he is doing well overall.  The pt reports that he has been well and has no new symptoms. Pt has had both doses of the COVID19 vaccine and tolerated them well. He notes that his blood glucose levels have been better overall and he has been taking his insulin more regularly.   Lab results today (02/24/20) of CBC w/diff and CMP is as follows: all values are WNL except for WBC at 26.6K, Hgb at 12.2, HCT at 38.5, Lymphs Abs at 18.3, Mono Abs at 1.5K, Abs Immature Granulocytes at 0.08K, WBC Morphology shows "Atypical Lymphocytes Present", Glucose at 183.  02/24/2020 LDH at 146  On review of systems, pt denies fevers, chills, night sweats, unexpected weight loss, abdominal pain, chest pain and any other symptoms.   MEDICAL HISTORY:  Past Medical History:  Diagnosis Date  . Allergy   . Diabetes mellitus without complication (HCC)   . Diabetic retinopathy (HCC)    Disabled 2/2 retinopathy  . GERD (gastroesophageal reflux disease)   . Hyperlipidemia   . Hypertension       SURGICAL HISTORY: No past surgical history on file.  SOCIAL HISTORY: Social History   Socioeconomic History  . Marital status: Married    Spouse name: Not on file  . Number of children: Not on file  . Years of education: Not on file  . Highest education level: Not on file  Occupational History  . Not on file  Tobacco Use  . Smoking status: Former Smoker  . Smokeless tobacco: Never Used  Substance and Sexual Activity  . Alcohol use: No     Alcohol/week: 0.0 standard drinks  . Drug use: No  . Sexual activity: Yes    Birth control/protection: None  Other Topics Concern  . Not on file  Social History Narrative  . Not on file   Social Determinants of Health   Financial Resource Strain:   . Difficulty of Paying Living Expenses:   Food Insecurity:   . Worried About Running Out of Food in the Last Year:   . Ran Out of Food in the Last Year:   Transportation Needs:   . Lack of Transportation (Medical):   . Lack of Transportation (Non-Medical):   Physical Activity:   . Days of Exercise per Week:   . Minutes of Exercise per Session:   Stress:   . Feeling of Stress :   Social Connections:   . Frequency of Communication with Friends and Family:   . Frequency of Social Gatherings with Friends and Family:   . Attends Religious Services:   . Active Member of Clubs or Organizations:   . Attends Club or Organization Meetings:   . Marital Status:   Intimate Partner Violence:   . Fear of Current or Ex-Partner:   . Emotionally Abused:   . Physically Abused:   . Sexually Abused:     FAMILY HISTORY: Family History  Problem Relation Age of Onset  . Diabetes Neg Hx     ALLERGIES:  has No Known Allergies.  MEDICATIONS:  Current Outpatient Medications  Medication Sig Dispense Refill  . Accu-Chek FastClix Lancets MISC Use to test sugars up to 4 times daily.  Dx Code: E11.319 102 each 12  . aspirin 81 MG tablet Take 81 mg by mouth daily.    . atorvastatin (LIPITOR) 40 MG tablet Take 1 tablet (40 mg total) by mouth daily. 90 tablet 3  . blood glucose meter kit and supplies KIT Dispense based on patient and insurance preference. Use up to four times daily as directed. Dx E11.319 1 each 0  . Blood Glucose Monitoring Suppl (ACCU-CHEK GUIDE) w/Device KIT 1 each by Does not apply route 4 (four) times daily as needed. Use to test sugars up to 4 times daily.  Dx Code: E11.319 1 kit 0  . Blood Pressure Monitoring (BLOOD PRESSURE CUFF)  MISC 1 Units by Does not apply route daily. 1 each 0  . gabapentin (NEURONTIN) 300 MG capsule Take 1 capsule (300 mg total) by mouth 3 (three) times daily. You can increase this to 2 pills (600 mg) three times a day if the lower dose is not helpful for your leg pain.  Use the lowest dose that takes away your discomfort. 270 capsule 0  . glucagon 1 MG injection Inject 1 mg into the muscle once as needed. May repeat in 15 minutes if needed. 1 each 12  . glucose blood (ACCU-CHEK GUIDE) test strip Use to test sugars up to 4 times daily.  Dx Code: E11.319 200 each 12  . Insulin Glargine (LANTUS) 100   UNIT/ML Solostar Pen Inject 35 Units into the skin 2 (two) times daily. 15 mL 11  . INVOKANA 100 MG TABS tablet Take 1 tablet by mouth once daily 90 tablet 0  . Lancet Devices (ACCU-CHEK SOFTCLIX) lancets Use as instructed up to 4 times daily.  Dx E11.319 400 each 3  . Lancets Misc. (ACCU-CHEK FASTCLIX LANCET) KIT Use to test sugars up to 4 times daily.  Dx Code: E11.319 1 kit 0  . lisinopril (ZESTRIL) 2.5 MG tablet Take 1 tablet (2.5 mg total) by mouth daily. 90 tablet 1  . loratadine (CLARITIN) 10 MG tablet Take 1 tablet (10 mg total) by mouth daily. 30 tablet 1  . metFORMIN (GLUCOPHAGE) 1000 MG tablet TAKE 1 TABLET BY MOUTH TWICE DAILY WITH A MEAL 180 tablet 0  . NOVOLOG FLEXPEN 100 UNIT/ML FlexPen Inject 10 Units into the skin 3 (three) times daily with meals. 15 mL 11  . RELION PEN NEEDLES 31G X 6 MM MISC USE AS DIRECTED TO  CHECK  BLOOD  SUGAR 50 each 23  . sildenafil (VIAGRA) 50 MG tablet Take 0.5 tablets (25 mg total) by mouth as needed for erectile dysfunction. 20 tablet 1   No current facility-administered medications for this visit.    REVIEW OF SYSTEMS:   A 10+ POINT REVIEW OF SYSTEMS WAS OBTAINED including neurology, dermatology, psychiatry, cardiac, respiratory, lymph, extremities, GI, GU, Musculoskeletal, constitutional, breasts, reproductive, HEENT.  All pertinent positives are noted in the  HPI.  All others are negative.   PHYSICAL EXAMINATION: ECOG FS:1 - Symptomatic but completely ambulatory  Vitals:   02/24/20 0958  BP: 120/65  Pulse: 86  Resp: 18  Temp: 98.5 F (36.9 C)  SpO2: 100%   Wt Readings from Last 3 Encounters:  02/24/20 146 lb 9.6 oz (66.5 kg)  01/10/20 142 lb (64.4 kg)  12/10/19 140 lb 6.4 oz (63.7 kg)   Body mass index is 22.29 kg/m.    GENERAL:alert, in no acute distress and comfortable SKIN: no acute rashes, no significant lesions EYES: conjunctiva are pink and non-injected, sclera anicteric OROPHARYNX: MMM, no exudates, no oropharyngeal erythema or ulceration NECK: supple, no JVD LYMPH:  no palpable lymphadenopathy in the axillary or inguinal regions. cervical lymphadenopathy b/l unchanged, maybe decreased in size.  LUNGS: clear to auscultation b/l with normal respiratory effort HEART: regular rate & rhythm ABDOMEN:  normoactive bowel sounds , non tender, not distended. No palpable hepatosplenomegaly.  Extremity: no pedal edema PSYCH: alert & oriented x 3 with fluent speech NEURO: no focal motor/sensory deficits  LABORATORY DATA:  I have reviewed the data as listed  . CBC Latest Ref Rng & Units 02/24/2020 10/25/2019 09/25/2019  WBC 4.0 - 10.5 K/uL 26.6(H) 22.5(H) -  Hemoglobin 13.0 - 17.0 g/dL 12.2(L) 12.1(L) 13.3  Hematocrit 39.0 - 52.0 % 38.5(L) 37.7(L) -  Platelets 150 - 400 K/uL 237 213 -    . CMP Latest Ref Rng & Units 02/24/2020 10/25/2019 06/24/2019  Glucose 70 - 99 mg/dL 183(H) 399(H) 399(H)  BUN 8 - 23 mg/dL _0 Creatinine 0.61 - 1.24 mg/dL 1.06 1.33(H) 1.14  Sodium 135 - 145 mmol/L 137 135 134(L)  Potassium 3.5 - 5.1 mmol/L 4.9 5.2(H) 5.1  Chloride 98 - 111 mmol/L 102 98 101  CO2 22 - 32 mmol/L _1 Calcium 8.9 - 10.3 mg/dL 9.2 9.4 9.1  Total Protein 6.5 - 8.1 g/dL 7.7 7.4 7.0  Total Bilirubin 0.3 - 1.2 mg/dL 0.3 0.4 0.3  Alkaline Phos 38 - 126 U/L 77 107 94  AST 15 - 41 U/L 17 16 17  ALT 0 - 44 U/L 17 15 15    06/18/18 Right Cervical LN Needle/core Biopsy:    05/23/18 Fine Needle Aspiration Flow Cytometry:    RADIOGRAPHIC STUDIES: I have personally reviewed the radiological images as listed and agreed with the findings in the report. No results found.  ASSESSMENT & PLAN:  69 y.o. male with  1. 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  05/16/18 Flow cytometry results which revealed NHL B-Cell lymphoma, and discussed that this is not completely diagnostic   03/23/18 US Soft Tissue Head/Neck revealed Multiple well-circumscribed neck lesions are identified, consistent with lymph nodes, the largest of which measures 2.1 x 1.5 x 2.3 cm. Benign behavior is not established. Bulky adenopathy such as this could relate to lymphoma, or metastatic squamous cell carcinoma. There is no visible extranodal spread of tumor.   06/18/18 biopsy favored a low grade Marginal Zone Lymphoma 06/15/18 PET/CT revealed Enlarged and hypermetabolic lymph nodes involving the neck, axilla, subpectoral and mediastinal lymph nodes. No lymphadenopathy below the diaphragm. No findings for osseous lymphoma  06/07/18 ECHO for treatment planning revealed a normal ejection fraction 2.  Patient Active Problem List   Diagnosis Date Noted  . Dizziness 09/26/2019  . Dysphagia 04/16/2019  . Lymphadenopathy 04/12/2018  . Cervical lymphadenopathy 03/22/2018  . Right leg swelling 10/19/2017  . PAD (peripheral artery disease) (HCC) 07/07/2017  . Neuropathic pain 07/07/2017  . Knee pain, chronic 06/15/2017  . Nonspecific abnormal electrocardiogram (ECG) (EKG) 09/02/2015  . Adhesive capsulitis of right shoulder 08/19/2015  . Bursitis of right shoulder 06/01/2015  . Allergic rhinitis 06/01/2015  . Acid reflux 05/13/2015  . 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 Essential hypertension 05/13/2015  . Hyperlipidemia associated with type 2 diabetes  mellitus (HCC) 05/13/2015  . Cough 05/13/2015   F/u with PCP for mx esp for uncontrolled DM2  PLAN: -Discussed pt labwork today, 02/24/20; Hgb and PLT are stable, Lymphocytes are unchanged, blood glucose has improved -Discussed 02/24/2020 LDH is WNL  -Pt has no new symptoms, no changes upon clinical exam -No lab or clinical evidence of significant progression of nodal marginal zone lymphoma at this time. -No acute indication to begin treatment at this time. -Will get PET/CT in 5 months -Will see back in 6 months with labs   FOLLOW UP: PET/CT in 5 months  RTC with Dr Kale with labs in 6 months  The total time spent in the appt was 20 minutes and more than 50% was on counseling and direct patient cares.  All of the patient's questions were answered with apparent satisfaction. The patient knows to call the clinic with any problems, questions or concerns.    Gautam Kale MD MS AAHIVMS SCH CTH Hematology/Oncology Physician [facility]  (Office):       [phone] (Work cell):  [phone] (Fax):           [phone]  02/24/2020 10:28 AM  I, Jazzmine Knight, am acting as a scribe for Dr. Gautam Kale.   .I have reviewed the above documentation for accuracy and completeness, and I agree with the above. .Gautam Kishore Kale MD       

## 2020-02-24 ENCOUNTER — Inpatient Hospital Stay: Payer: Medicare HMO | Attending: Hematology

## 2020-02-24 ENCOUNTER — Inpatient Hospital Stay (HOSPITAL_BASED_OUTPATIENT_CLINIC_OR_DEPARTMENT_OTHER): Payer: Medicare HMO | Admitting: Hematology

## 2020-02-24 ENCOUNTER — Other Ambulatory Visit: Payer: Self-pay

## 2020-02-24 VITALS — BP 120/65 | HR 86 | Temp 98.5°F | Resp 18 | Ht 68.0 in | Wt 146.6 lb

## 2020-02-24 DIAGNOSIS — C9110 Chronic lymphocytic leukemia of B-cell type not having achieved remission: Secondary | ICD-10-CM

## 2020-02-24 DIAGNOSIS — C8591 Non-Hodgkin lymphoma, unspecified, lymph nodes of head, face, and neck: Secondary | ICD-10-CM | POA: Diagnosis not present

## 2020-02-24 DIAGNOSIS — C8590 Non-Hodgkin lymphoma, unspecified, unspecified site: Secondary | ICD-10-CM

## 2020-02-24 DIAGNOSIS — R6889 Other general symptoms and signs: Secondary | ICD-10-CM | POA: Diagnosis not present

## 2020-02-24 LAB — CMP (CANCER CENTER ONLY)
ALT: 17 U/L (ref 0–44)
AST: 17 U/L (ref 15–41)
Albumin: 3.5 g/dL (ref 3.5–5.0)
Alkaline Phosphatase: 77 U/L (ref 38–126)
Anion gap: 11 (ref 5–15)
BUN: 15 mg/dL (ref 8–23)
CO2: 24 mmol/L (ref 22–32)
Calcium: 9.2 mg/dL (ref 8.9–10.3)
Chloride: 102 mmol/L (ref 98–111)
Creatinine: 1.06 mg/dL (ref 0.61–1.24)
GFR, Est AFR Am: >60 mL/min (ref >60)
GFR, Estimated: >60 mL/min (ref >60)
Glucose, Bld: 183 mg/dL — ABNORMAL HIGH (ref 70–99)
Potassium: 4.9 mmol/L (ref 3.5–5.1)
Sodium: 137 mmol/L (ref 135–145)
Total Bilirubin: 0.3 mg/dL (ref 0.3–1.2)
Total Protein: 7.7 g/dL (ref 6.5–8.1)

## 2020-02-24 LAB — CBC WITH DIFFERENTIAL/PLATELET
Abs Immature Granulocytes: 0.08 10*3/uL — ABNORMAL HIGH (ref 0.00–0.07)
Basophils Absolute: 0.1 10*3/uL (ref 0.0–0.1)
Basophils Relative: 0 %
Eosinophils Absolute: 0.4 10*3/uL (ref 0.0–0.5)
Eosinophils Relative: 1 %
HCT: 38.5 % — ABNORMAL LOW (ref 39.0–52.0)
Hemoglobin: 12.2 g/dL — ABNORMAL LOW (ref 13.0–17.0)
Immature Granulocytes: 0 %
Lymphocytes Relative: 69 %
Lymphs Abs: 18.3 10*3/uL — ABNORMAL HIGH (ref 0.7–4.0)
MCH: 26.5 pg (ref 26.0–34.0)
MCHC: 31.7 g/dL (ref 30.0–36.0)
MCV: 83.7 fL (ref 80.0–100.0)
Monocytes Absolute: 1.5 10*3/uL — ABNORMAL HIGH (ref 0.1–1.0)
Monocytes Relative: 6 %
Neutro Abs: 6.3 10*3/uL (ref 1.7–7.7)
Neutrophils Relative %: 24 %
Platelets: 237 10*3/uL (ref 150–400)
RBC: 4.6 MIL/uL (ref 4.22–5.81)
RDW: 14.4 % (ref 11.5–15.5)
WBC: 26.6 10*3/uL — ABNORMAL HIGH (ref 4.0–10.5)
nRBC: 0 % (ref 0.0–0.2)

## 2020-02-24 LAB — LACTATE DEHYDROGENASE: LDH: 146 U/L (ref 98–192)

## 2020-02-27 ENCOUNTER — Telehealth: Payer: Self-pay | Admitting: Hematology

## 2020-02-27 NOTE — Telephone Encounter (Signed)
Scheduled per 04/05 los, patient has been called and notified. ?

## 2020-03-04 ENCOUNTER — Encounter: Payer: Self-pay | Admitting: Family Medicine

## 2020-03-04 DIAGNOSIS — C9110 Chronic lymphocytic leukemia of B-cell type not having achieved remission: Secondary | ICD-10-CM | POA: Insufficient documentation

## 2020-04-16 ENCOUNTER — Other Ambulatory Visit: Payer: Self-pay | Admitting: Family Medicine

## 2020-04-16 DIAGNOSIS — E11319 Type 2 diabetes mellitus with unspecified diabetic retinopathy without macular edema: Secondary | ICD-10-CM

## 2020-04-16 DIAGNOSIS — E785 Hyperlipidemia, unspecified: Secondary | ICD-10-CM

## 2020-05-18 ENCOUNTER — Other Ambulatory Visit: Payer: Self-pay | Admitting: *Deleted

## 2020-05-18 DIAGNOSIS — M79605 Pain in left leg: Secondary | ICD-10-CM

## 2020-05-18 MED ORDER — GABAPENTIN 300 MG PO CAPS: 300.0000 mg | ORAL_CAPSULE | Freq: Three times a day (TID) | ORAL | 0 refills | Status: DC

## 2020-07-24 ENCOUNTER — Other Ambulatory Visit: Payer: Self-pay

## 2020-07-28 ENCOUNTER — Telehealth (HOSPITAL_COMMUNITY): Payer: Self-pay

## 2020-07-28 MED ORDER — ACCU-CHEK GUIDE VI STRP
ORAL_STRIP | 12 refills | Status: DC
Start: 2020-07-28 — End: 2021-06-24

## 2020-07-28 MED ORDER — ACCU-CHEK FASTCLIX LANCETS MISC: 12 refills | Status: DC

## 2020-07-30 NOTE — Telephone Encounter (Signed)
Notified by Morey Hummingbird with Radiology Scheduling that she had attempted since 07/17/20 to contact patient to schedule PET ordered in April 2021. Patient has not answered and patient's emergency contact number is not in service.

## 2020-08-18 ENCOUNTER — Other Ambulatory Visit: Payer: Self-pay | Admitting: Family Medicine

## 2020-08-18 DIAGNOSIS — E11319 Type 2 diabetes mellitus with unspecified diabetic retinopathy without macular edema: Secondary | ICD-10-CM

## 2020-08-19 NOTE — Telephone Encounter (Signed)
Needs appointment

## 2020-08-19 NOTE — Telephone Encounter (Signed)
LVM for pt to call office to assist him in getting an appointment scheduled per Dr. Sandi Carne.Zyara Riling Zimmerman Rumple, CMA

## 2020-08-20 ENCOUNTER — Other Ambulatory Visit: Payer: Self-pay | Admitting: Family Medicine

## 2020-08-20 DIAGNOSIS — I10 Essential (primary) hypertension: Secondary | ICD-10-CM

## 2020-08-21 NOTE — Telephone Encounter (Signed)
Please call patient to have him schedule an appointment.

## 2020-08-25 ENCOUNTER — Ambulatory Visit: Payer: Medicare HMO | Admitting: Hematology

## 2020-08-25 ENCOUNTER — Other Ambulatory Visit: Payer: Medicare HMO

## 2020-08-26 ENCOUNTER — Telehealth: Payer: Self-pay | Admitting: Hematology

## 2020-08-26 NOTE — Telephone Encounter (Signed)
Called pt per 10/6 sch msg - no answer. Left message for patient to call back to reschedule missed appt.

## 2020-09-04 NOTE — Telephone Encounter (Signed)
LVM to call office to assist in getting an appointment scheduled.Alec York, CMA

## 2020-09-17 NOTE — Telephone Encounter (Signed)
Sent letter asking pt to call and schedule an appointment. Will wait for a response. Ottis Stain, CMA

## 2020-12-30 ENCOUNTER — Other Ambulatory Visit: Payer: Self-pay | Admitting: *Deleted

## 2020-12-30 ENCOUNTER — Other Ambulatory Visit: Payer: Self-pay | Admitting: Hematology

## 2020-12-30 ENCOUNTER — Telehealth: Payer: Self-pay | Admitting: *Deleted

## 2020-12-30 DIAGNOSIS — C9110 Chronic lymphocytic leukemia of B-cell type not having achieved remission: Secondary | ICD-10-CM

## 2020-12-30 NOTE — Telephone Encounter (Signed)
Okay to schedule clinic visit with labs CBC/differential, CMP, LDH in 2 to 3 weeks.  Will need to be evaluated and assessed so that there is of note to have a PET scan approved. PET scan will not be approved he has not been currently evaluated for nearly a year.

## 2020-12-30 NOTE — Telephone Encounter (Signed)
Alec York left a message stating he is back from [place] and would like to make an appt with you.  It looks like he is due for a PET.

## 2020-12-30 NOTE — Telephone Encounter (Signed)
Notified of message below

## 2021-01-04 ENCOUNTER — Ambulatory Visit: Payer: Medicare HMO | Admitting: Family Medicine

## 2021-01-10 NOTE — Progress Notes (Signed)
HEMATOLOGY/ONCOLOGY CLNIC NOTE  Date of Service: 01/10/2021  Patient Care Team: Cleophas Dunker, DO as PCP - General (Family Medicine)  Melissa Montane, MD as ENT  CHIEF COMPLAINTS/PURPOSE OF CONSULTATION:  Non-Hodgkin's Lymphoma   HISTORY OF PRESENTING ILLNESS:   Alec York is a wonderful 70 y.o. male who has been referred to [place] by ENT Dr. Melissa Montane for evaluation and management of Non-Hodgkin's Lymphoma. He is accompanied today by his wife. The pt reports that he is doing well overall.   The pt had a Fine needle aspiration of the left neck mass on 05/16/18 which confirmed a B-cell Non-Hodgkin's lymphoma.   The pt reports well controlled DM and HTN. He denies neuropathy in his hands but this sometimes occurs in his legs. He also endorses vision changes related to his DM and he takes Lantus and Metformin. He denies heart or kidney problems, or previous surgeries. He notes that his PCP Dr. Wendee Beavers manages his DM.   He first noticed the right neck mass about 3 months ago and describes that the appearance was sudden. He denies any other lumps or bumps, fevers, chills, night sweats, unexpected weight loss, and change in appetite.   Of note prior to the patient's visit today, pt has had US Soft Tissue Head/Neck completed on 03/23/18 with results revealing Multiple well-circumscribed neck lesions are identified, consistent with lymph nodes, the largest of which measures 2.1 x 1.5 x 2.3 cm. Benign behavior is not established. Bulky adenopathy such as this could relate to lymphoma, or metastatic squamous cell carcinoma. There is no visible extranodal spread of tumor.   Most recent lab results (03/22/18) of CBC w/diff is as follows: all values are WNL except for WBC at 12.8k, HGB at 12.3, Lymphs abs at 6.3k.  On review of systems, pt reports left neck mass, eating well, and denies fevers, chills, night sweats, unexpected weight loss, noticing any other lumps or bumps, pain along the  spine, abdominal pains, leg swelling, testicular pain or swelling, skin rashes, and any other symptoms.   On Social Hx the pt reports that he quit smoking cigarettes 10 years ago, and had been smoking 5-6 cigarettes each day. He denies drinking any ETOH.  On Family Hx the pt denies cancer.   Interval History:   Alec York returns today for management and evaluation of his Non-Hodgkin's Lymphoma. The patient's last visit with [place] was on 02/24/2020. The pt reports that he is doing well overall.  The pt reports he recently returned from [place]. He notes he recently lost his son to [place] and is still having a lot of emotional pain related to this. 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 He notes that he has lot some weight since his CABG. DM2 still not well controlled . He notes he has f/u with his PCP and cardiology.  No fevers/chills/nights. Is concerned about whether his lymphoma has progressed.  CT chest in [place] with rt LL lung mass/loculated fluid.  Lab results today 01/11/2021 reviewed with patient.  On review of systems, pt reports no other acute symptoms that those noted above.   MEDICAL HISTORY:  Past Medical History:  Diagnosis Date  . Allergy   . Diabetes mellitus without complication ([place])   . Diabetic retinopathy ([place])    Disabled 2/2 retinopathy  . GERD (gastroesophageal reflux disease)   . Hyperlipidemia   . Hypertension     SURGICAL HISTORY: No past surgical history on file.  SOCIAL HISTORY: Social History   Socioeconomic History  . Marital status: Married    Spouse name: Not on file  . Number of children: Not on file  . Years of education: Not on file  . Highest education level: Not on file  Occupational History  . Not on file  Tobacco Use  . Smoking status: Former Research scientist (life sciences)  . Smokeless tobacco: Never Used  Vaping Use  . Vaping Use: Never used  Substance and Sexual Activity  . Alcohol use: No    Alcohol/week: 0.0 standard drinks  . Drug  use: No  . Sexual activity: Yes    Birth control/protection: None  Other Topics Concern  . Not on file  Social History Narrative  . Not on file   Social Determinants of Health   Financial Resource Strain: Not on file  Food Insecurity: Not on file  Transportation Needs: Not on file  Physical Activity: Not on file  Stress: Not on file  Social Connections: Not on file  Intimate Partner Violence: Not on file    FAMILY HISTORY: Family History  Problem Relation Age of Onset  . Diabetes Neg Hx     ALLERGIES:  has No Known Allergies.  MEDICATIONS:  Current Outpatient Medications  Medication Sig Dispense Refill  . Accu-Chek FastClix Lancets MISC Use to test sugars up to 4 times daily.  Dx Code: E11.319 102 each 12  . aspirin 81 MG tablet Take 81 mg by mouth daily.    [name] atorvastatin (LIPITOR) 40 MG tablet Take 1 tablet by mouth once daily 90 tablet 0  . blood glucose meter kit and supplies KIT Dispense based on patient and insurance preference. Use up to four times daily as directed. Dx E11.319 1 each 0  . Blood Glucose Monitoring Suppl (ACCU-CHEK GUIDE) w/Device KIT 1 each by Does not apply route 4 (four) times daily as needed. Use to test sugars up to 4 times daily.  Dx Code: E11.319 1 kit 0  . Blood Pressure Monitoring (BLOOD PRESSURE CUFF) MISC 1 Units by Does not apply route daily. 1 each 0  . gabapentin (NEURONTIN) 300 MG capsule Take 1 capsule (300 mg total) by mouth 3 (three) times daily. You can increase this to 2 pills (600 mg) three times a day if the lower dose is not helpful for your leg pain.  Use the lowest dose that takes away your discomfort. 270 capsule 0  . glucagon 1 MG injection Inject 1 mg into the muscle once as needed. May repeat in 15 minutes if needed. 1 each 12  . glucose blood (ACCU-CHEK GUIDE) test strip Use to test sugars up to 4 times daily.  Dx Code: E11.319 200 each 12  . Insulin Glargine (LANTUS) 100 UNIT/ML Solostar Pen Inject 35 Units into the skin 2  (two) times daily. 15 mL 11  . INVOKANA 100 MG TABS tablet Take 1 tablet by mouth once daily 30 tablet 0  . Lancet Devices (ACCU-CHEK SOFTCLIX) lancets Use as instructed up to 4 times daily.  Dx E11.319 400 each 3  . Lancets Misc. (ACCU-CHEK FASTCLIX LANCET) KIT Use to test sugars up to 4 times daily.  Dx Code: E11.319 1 kit 0  . lisinopril (ZESTRIL) 2.5 MG tablet Take 1 tablet by mouth once daily 90 tablet 0  . loratadine (CLARITIN) 10 MG tablet Take 1 tablet (10 mg total) by mouth daily. 30 tablet 1  . metFORMIN (GLUCOPHAGE) 1000 MG tablet TAKE 1 TABLET BY MOUTH TWICE DAILY WITH  A MEAL 60 tablet 0  . NOVOLOG FLEXPEN 100 UNIT/ML FlexPen Inject 10 Units into the skin 3 (three) times daily with meals. 15 mL 11  . RELION PEN NEEDLES 31G X 6 MM MISC USE AS DIRECTED TO  CHECK  BLOOD  SUGAR 50 each 23  . sildenafil (VIAGRA) 50 MG tablet Take 0.5 tablets (25 mg total) by mouth as needed for erectile dysfunction. 20 tablet 1   No current facility-administered medications for this visit.    REVIEW OF SYSTEMS:   10 Point review of Systems was done is negative except as noted above   PHYSICAL EXAMINATION: ECOG FS:1 - Symptomatic but completely ambulatory  Vitals:   01/12/21 0845  BP: (!) 111/57  Pulse: 85  Resp: 17  Temp: (!) 96.1 F (35.6 C)  SpO2: 100%   Wt Readings from Last 3 Encounters:  02/24/20 146 lb 9.6 oz (66.5 kg)  01/10/20 142 lb (64.4 kg)  12/10/19 140 lb 6.4 oz (63.7 kg)   Body mass index is 19.25 kg/m.    NAD GENERAL:alert, in no acute distress and comfortable SKIN: no acute rashes, no significant lesions EYES: conjunctiva are pink and non-injected, sclera anicteric OROPHARYNX: MMM, no exudates, no oropharyngeal erythema or ulceration NECK: supple, no JVD LYMPH:  no palpable lymphadenopathy in the cervical, axillary or inguinal regions LUNGS: clear to auscultation b/l with normal respiratory effort HEART: regular rate & rhythm ABDOMEN:  normoactive bowel sounds ,  non tender, not distended. Extremity: no pedal edema PSYCH: alert & oriented x 3 with fluent speech NEURO: no focal motor/sensory deficits  LABORATORY DATA:  I have reviewed the data as listed  . CBC Latest Ref Rng & Units 01/12/2021 02/24/2020 10/25/2019  WBC 4.0 - 10.5 K/uL 20.0(H) 26.6(H) 22.5(H)  Hemoglobin 13.0 - 17.0 g/dL 10.9(L) 12.2(L) 12.1(L)  Hematocrit 39.0 - 52.0 % 34.6(L) 38.5(L) 37.7(L)  Platelets 150 - 400 K/uL 216 237 213    . CMP Latest Ref Rng & Units 01/12/2021 02/24/2020 10/25/2019  Glucose 70 - 99 mg/dL 227(H) 183(H) 399(H)  BUN 8 - 23 mg/dL 17 15 15   Creatinine 0.61 - 1.24 mg/dL 1.08 1.06 1.33(H)  Sodium 135 - 145 mmol/L 137 137 135  Potassium 3.5 - 5.1 mmol/L 5.0 4.9 5.2(H)  Chloride 98 - 111 mmol/L 101 102 98  CO2 22 - 32 mmol/L 25 24 27   Calcium 8.9 - 10.3 mg/dL 9.8 9.2 9.4  Total Protein 6.5 - 8.1 g/dL 8.9(H) 7.7 7.4  Total Bilirubin 0.3 - 1.2 mg/dL 0.3 0.3 0.4  Alkaline Phos 38 - 126 U/L 88 77 107  AST 15 - 41 U/L 12(L) 17 16  ALT 0 - 44 U/L 9 17 15    . Lab Results  Component Value Date   LDH 143 01/12/2021    06/18/18 Right Cervical LN Needle/core Biopsy:    05/23/18 Fine Needle Aspiration Flow Cytometry:    RADIOGRAPHIC STUDIES: I have personally reviewed the radiological images as listed and agreed with the findings in the report. No results found.  ASSESSMENT & PLAN:  70 y.o. male with  1. 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  05/16/18 Flow cytometry results which revealed NHL B-Cell lymphoma, and discussed that this is not completely diagnostic   03/23/18 US Soft Tissue Head/Neck revealed Multiple well-circumscribed neck lesions are identified, consistent with lymph nodes, the largest of which measures 2.1 x 1.5 x 2.3 cm. Benign behavior is not established. Bulky  adenopathy such as this could relate to lymphoma, or metastatic squamous cell carcinoma. There is no visible extranodal  spread of tumor.   06/18/18 biopsy favored a low grade Marginal Zone Lymphoma 06/15/18 PET/CT revealed Enlarged and hypermetabolic lymph nodes involving the neck, axilla, subpectoral and mediastinal lymph nodes. No lymphadenopathy below the diaphragm. No findings for osseous lymphoma  06/07/18 ECHO for treatment planning revealed a normal ejection fraction  PLAN -labs done today -- mild anemia, no other acute changes -will get PET/CT in 6 weeks with to re-evaluate status of his marginal zone lymphoma due to concerns of progression  2.CAD s/p recent CABG 3. Uncontrolled DM2 4. HTN 5. PAD PLAN F/u with cardiology for optimization of CAD mx F/u with PCP for mx esp for uncontrolled DM2 - might need to consider endocrinology referral if needed.   FOLLOW UP: PET/CT in 6 weeks  RTC with Dr Irene Limbo in 2 months with labs   The total time spent in the appointment was 30 minutes and more than 50% was on counseling and direct patient cares.  All of the patient's questions were answered with apparent satisfaction. The patient knows to call the clinic with any problems, questions or concerns.    Sullivan Lone MD [place] AAHIVMS [facility] Hematology/Oncology Physician [facility]  (Office):       [phone] (Work cell):  [phone] (Fax):           [phone]  01/10/2021 9:52 AM  I, Reinaldo Raddle, am acting as scribe for Dr. Sullivan Lone, MD.    .I have reviewed the above documentation for accuracy and completeness, and I agree with the above. Brunetta Genera MD

## 2021-01-12 ENCOUNTER — Telehealth: Payer: Self-pay | Admitting: Hematology

## 2021-01-12 ENCOUNTER — Inpatient Hospital Stay: Payer: Medicare HMO

## 2021-01-12 ENCOUNTER — Inpatient Hospital Stay: Payer: Medicare HMO | Attending: Hematology | Admitting: Hematology

## 2021-01-12 ENCOUNTER — Other Ambulatory Visit: Payer: Self-pay

## 2021-01-12 ENCOUNTER — Telehealth: Payer: Self-pay | Admitting: *Deleted

## 2021-01-12 VITALS — BP 111/57 | HR 85 | Temp 96.1°F | Resp 17 | Ht 68.0 in | Wt 126.6 lb

## 2021-01-12 DIAGNOSIS — D649 Anemia, unspecified: Secondary | ICD-10-CM

## 2021-01-12 DIAGNOSIS — Z7984 Long term (current) use of oral hypoglycemic drugs: Secondary | ICD-10-CM | POA: Insufficient documentation

## 2021-01-12 DIAGNOSIS — Z794 Long term (current) use of insulin: Secondary | ICD-10-CM | POA: Diagnosis not present

## 2021-01-12 DIAGNOSIS — I10 Essential (primary) hypertension: Secondary | ICD-10-CM | POA: Insufficient documentation

## 2021-01-12 DIAGNOSIS — Z87891 Personal history of nicotine dependence: Secondary | ICD-10-CM | POA: Diagnosis not present

## 2021-01-12 DIAGNOSIS — R6889 Other general symptoms and signs: Secondary | ICD-10-CM | POA: Diagnosis not present

## 2021-01-12 DIAGNOSIS — C8590 Non-Hodgkin lymphoma, unspecified, unspecified site: Secondary | ICD-10-CM

## 2021-01-12 DIAGNOSIS — C9110 Chronic lymphocytic leukemia of B-cell type not having achieved remission: Secondary | ICD-10-CM

## 2021-01-12 DIAGNOSIS — E119 Type 2 diabetes mellitus without complications: Secondary | ICD-10-CM | POA: Diagnosis not present

## 2021-01-12 DIAGNOSIS — C8591 Non-Hodgkin lymphoma, unspecified, lymph nodes of head, face, and neck: Secondary | ICD-10-CM | POA: Diagnosis not present

## 2021-01-12 LAB — CBC WITH DIFFERENTIAL (CANCER CENTER ONLY)
Abs Immature Granulocytes: 0.04 10*3/uL (ref 0.00–0.07)
Basophils Absolute: 0.1 10*3/uL (ref 0.0–0.1)
Basophils Relative: 0 %
Eosinophils Absolute: 0.4 10*3/uL (ref 0.0–0.5)
Eosinophils Relative: 2 %
HCT: 34.6 % — ABNORMAL LOW (ref 39.0–52.0)
Hemoglobin: 10.9 g/dL — ABNORMAL LOW (ref 13.0–17.0)
Immature Granulocytes: 0 %
Lymphocytes Relative: 55 %
Lymphs Abs: 10.8 10*3/uL — ABNORMAL HIGH (ref 0.7–4.0)
MCH: 25.1 pg — ABNORMAL LOW (ref 26.0–34.0)
MCHC: 31.5 g/dL (ref 30.0–36.0)
MCV: 79.5 fL — ABNORMAL LOW (ref 80.0–100.0)
Monocytes Absolute: 1.2 10*3/uL — ABNORMAL HIGH (ref 0.1–1.0)
Monocytes Relative: 6 %
Neutro Abs: 7.4 10*3/uL (ref 1.7–7.7)
Neutrophils Relative %: 37 %
Platelet Count: 216 10*3/uL (ref 150–400)
RBC: 4.35 MIL/uL (ref 4.22–5.81)
RDW: 16.4 % — ABNORMAL HIGH (ref 11.5–15.5)
WBC Count: 20 10*3/uL — ABNORMAL HIGH (ref 4.0–10.5)
nRBC: 0 % (ref 0.0–0.2)

## 2021-01-12 LAB — LACTATE DEHYDROGENASE: LDH: 143 U/L (ref 98–192)

## 2021-01-12 LAB — CMP (CANCER CENTER ONLY)
ALT: 9 U/L (ref 0–44)
AST: 12 U/L — ABNORMAL LOW (ref 15–41)
Albumin: 4 g/dL (ref 3.5–5.0)
Alkaline Phosphatase: 88 U/L (ref 38–126)
Anion gap: 11 (ref 5–15)
BUN: 17 mg/dL (ref 8–23)
CO2: 25 mmol/L (ref 22–32)
Calcium: 9.8 mg/dL (ref 8.9–10.3)
Chloride: 101 mmol/L (ref 98–111)
Creatinine: 1.08 mg/dL (ref 0.61–1.24)
GFR, Estimated: >60 mL/min (ref >60)
Glucose, Bld: 227 mg/dL — ABNORMAL HIGH (ref 70–99)
Potassium: 5 mmol/L (ref 3.5–5.1)
Sodium: 137 mmol/L (ref 135–145)
Total Bilirubin: 0.3 mg/dL (ref 0.3–1.2)
Total Protein: 8.9 g/dL — ABNORMAL HIGH (ref 6.5–8.1)

## 2021-01-12 MED ORDER — POLYSACCHARIDE IRON COMPLEX 150 MG PO CAPS: 150.0000 mg | ORAL_CAPSULE | Freq: Every day | ORAL | 5 refills | Status: DC

## 2021-01-12 NOTE — Telephone Encounter (Signed)
Patient called - he said the medication ordered today was over $1000 when he went to pharmacy to pick up. He said he cannot afford it and needs a different [place] and confirmed with pharmacy that Iron polysaccharide is not covered by his insurance and is about $1000.  Dr. Irene Limbo informed. Per Dr. Irene Limbo - instead of Niferex, patient can take OTC iron 65 mg/325 Ferrous Sulfate - one per day. Contacted patient with this information. Spoke with patient and he gave phone to niece - niece also  verbalized understanding,  Translating for patient and states she will give patient written instructions. Niece inquired about use of stool softener if iron is constipating - advised that OTC stool softener can be taken with iron if needed.

## 2021-01-12 NOTE — Progress Notes (Deleted)
    SUBJECTIVE:   CHIEF COMPLAINT / HPI:   HTN Current regimen: Metoprolol 50 mg daily, lisinopril 2.5 mg daily, torsemide 10 mg daily Last CMP on 01/12/2021, creatinine 1.08 Denies chest pain, shortness of breath***  T2DM with HLD Current regimen: Lantus 35 units twice daily***, Invokana 100 mg daily, Metformin 1000 mg twice daily*** CBGs*** Last A1c 10.3 on 01/10/2020 Last lipid panel 03/2019, LDL 46 at that time Currently on Lipitor and lisinopril for renal protection Last eye exam*** Foot exam***  Non-Hodgkin's lymphoma, CLL Currently following with oncology Last visit was on 2/22 ***  COVID booster***    PERTINENT  PMH / PSH: ***  OBJECTIVE:   There were no vitals taken for this visit.  ***  ASSESSMENT/PLAN:   No problem-specific Assessment & Plan notes found for this encounter.     Cleophas Dunker, [place]

## 2021-01-12 NOTE — Telephone Encounter (Signed)
Scheduled per 02/22 los, patient has received updated calender.

## 2021-01-13 ENCOUNTER — Ambulatory Visit: Payer: Medicare HMO | Admitting: Family Medicine

## 2021-01-26 ENCOUNTER — Ambulatory Visit: Payer: Medicare HMO | Admitting: Family Medicine

## 2021-01-27 NOTE — Progress Notes (Signed)
SUBJECTIVE:   CHIEF COMPLAINT / HPI:   CAD Went to [place] in 03/16/20, son had unexpectedly died When he was there, he had a heart attack Had CABG x2 06/23/2020 Current Meds: Clopidogrel 75mg , ASA 75mg  , rosuvastatin 10mg  (combo), metoprolol succinate 50mg  QD, nicorandil 10mg , torsemide 10mg   Per notes that he brings, EF was 55%, G2DD, concentric LVH Never had cardiac rehab Has been feeling tired and weak, denies chest pain and states that overall he is doing okay  HTN Current regimen: Metoprolol 50 mg daily, lisinopril 2.5 mg daily, torsemide 10 mg daily Last CMP on 01/12/2021, creatinine 1.08 Denies worsening chest pain, shortness of breath  T2DM with HLD Current regimen: stopped insulin while in [place], has been without invokana for 2 months but prescribed, Invokana 100 mg daily, Metformin 1000 mg twice daily Last A1c 10.3 on 01/10/2020 Last lipid panel 03/2019, LDL 46 at that time In papers from [place], had lipid panel 11/16/20, LDL 49 at that time Currently on statin and lisinopril for renal protection Last eye exam was a week ago, planning for injection 3/16, OD first, then OS next month  Non-Hodgkin's lymphoma, CLL Currently following with oncology Last visit was on 2/22 Taking Iron  COVID booster - has received    PERTINENT  PMH / PSH: HTN, PAD, T2DM, HLD, peripheral neuropathy, diabetic retinopathy, CLL  OBJECTIVE:   BP (!) 144/63   Pulse 88   Ht 5\' 8"  (1.727 m)   Wt 129 lb 6.4 oz (58.7 kg)   SpO2 100%   BMI 19.68 kg/m    Physical Exam:  General: 70 y.o. male in NAD Cardio: RRR no m/r/g, linear scar over sternum Lungs: CTAB, no wheezing, no rhonchi, no crackles, no IWOB on RA Skin: warm and dry Extremities: No edema    Results for orders placed or performed in visit on 01/28/21 (from the past 24 hour(s))  HgB A1c     Status: Abnormal   Collection Time: 01/28/21  1:44 PM  Result Value Ref Range   Hemoglobin A1C 8.2 (A) 4.0 - 5.6 %   HbA1c POC (<>  result, manual entry)     HbA1c, POC (prediabetic range)     HbA1c, POC (controlled diabetic range)      ASSESSMENT/PLAN:   Coronary artery disease involving native heart without angina pectoris Medications following CABG converted to p.o. equivalents.  Continue clopidogrel 75 mg, aspirin 81 mg, rosuvastatin 10 mg, metoprolol succinate 50 mg daily.  We will also continue with torsemide 10 mg daily.  He just had a CMP, therefore no need to repeat, will but will go ahead and obtain a lipid panel.  Previously his LDL has been within goal.  Armandina Gemma seems to be a vasodilator, he has not been having chest pain, would hold off on starting Imdur and defer to cardiology.  We will go ahead and refer him to cardiology given that he has now had an MI and CABG.  We will also refer to cardiac rehab given that he has been feeling tired and weak.  Could also consider depression contributing in the setting of heart attack and loss of his son unexpectedly.  We will have him follow-up in 1 month with me.  Essential hypertension BP slightly above goal today.  We will continue him on metoprolol 50 mg daily, torsemide 10 mg daily.  He had previously been prescribed lisinopril 2.5 mg daily, but did not bring with him today.  We will go ahead and  restart this as well.  Can titrate this up as needed for blood pressure control.  We will have him come back in 1 month and repeat a BMP at that time.  Type 2 diabetes mellitus with diabetic retinopathy He is already following with his eye doctor and has seen them within the last week.  He is planning for injections within the next week.  His A1c today is 8.2.  He has been on Metformin 1000 mg twice daily only.  We will go ahead and restart him on Invokana 100 mg daily and continue with Metformin 1000 mg twice daily.  He can continue to check his CBGs.  We will see him back in 1 month.  We will obtain a BMP at that time.  Continue with statin and ACE per  above.  Hyperlipidemia associated with type 2 diabetes mellitus Obtain lipid panel today per above.  Continue with rosuvastatin 10 mg daily.  CLL (chronic lymphocytic leukemia) ([place]) Follows with oncology.  Has recently been seen by them on 2/22.  He continues to take iron.     Cleophas Dunker, [place]

## 2021-01-28 ENCOUNTER — Other Ambulatory Visit: Payer: Self-pay

## 2021-01-28 ENCOUNTER — Ambulatory Visit (INDEPENDENT_AMBULATORY_CARE_PROVIDER_SITE_OTHER): Payer: 59 | Admitting: Family Medicine

## 2021-01-28 ENCOUNTER — Encounter: Payer: Self-pay | Admitting: Family Medicine

## 2021-01-28 VITALS — BP 144/63 | HR 88 | Ht 68.0 in | Wt 129.4 lb

## 2021-01-28 DIAGNOSIS — E11311 Type 2 diabetes mellitus with unspecified diabetic retinopathy with macular edema: Secondary | ICD-10-CM

## 2021-01-28 DIAGNOSIS — E1169 Type 2 diabetes mellitus with other specified complication: Secondary | ICD-10-CM | POA: Diagnosis not present

## 2021-01-28 DIAGNOSIS — C9110 Chronic lymphocytic leukemia of B-cell type not having achieved remission: Secondary | ICD-10-CM

## 2021-01-28 DIAGNOSIS — I2510 Atherosclerotic heart disease of native coronary artery without angina pectoris: Secondary | ICD-10-CM

## 2021-01-28 DIAGNOSIS — I10 Essential (primary) hypertension: Secondary | ICD-10-CM

## 2021-01-28 DIAGNOSIS — I252 Old myocardial infarction: Secondary | ICD-10-CM | POA: Insufficient documentation

## 2021-01-28 DIAGNOSIS — I5032 Chronic diastolic (congestive) heart failure: Secondary | ICD-10-CM

## 2021-01-28 DIAGNOSIS — E785 Hyperlipidemia, unspecified: Secondary | ICD-10-CM

## 2021-01-28 DIAGNOSIS — Z794 Long term (current) use of insulin: Secondary | ICD-10-CM | POA: Diagnosis not present

## 2021-01-28 LAB — POCT GLYCOSYLATED HEMOGLOBIN (HGB A1C): Hemoglobin A1C: 8.2 % — AB (ref 4.0–5.6)

## 2021-01-28 MED ORDER — METFORMIN HCL 1000 MG PO TABS: 1000.0000 mg | ORAL_TABLET | Freq: Two times a day (BID) | ORAL | 0 refills | Status: DC

## 2021-01-28 MED ORDER — METOPROLOL SUCCINATE ER 50 MG PO TB24
50.0000 mg | ORAL_TABLET | Freq: Every day | ORAL | 3 refills | Status: DC
Start: 2021-01-28 — End: 2021-11-29

## 2021-01-28 MED ORDER — NITROGLYCERIN 0.4 MG SL SUBL: 0.4000 mg | SUBLINGUAL_TABLET | SUBLINGUAL | 12 refills | Status: DC | PRN

## 2021-01-28 MED ORDER — TORSEMIDE 10 MG PO TABS: 10.0000 mg | ORAL_TABLET | Freq: Every day | ORAL | 3 refills | Status: DC

## 2021-01-28 MED ORDER — CANAGLIFLOZIN 100 MG PO TABS: 100.0000 mg | ORAL_TABLET | Freq: Every day | ORAL | 3 refills | Status: DC

## 2021-01-28 MED ORDER — ASPIRIN EC 81 MG PO TBEC: 81.0000 mg | DELAYED_RELEASE_TABLET | Freq: Every day | ORAL | 3 refills | Status: AC

## 2021-01-28 MED ORDER — CLOPIDOGREL BISULFATE 75 MG PO TABS: 75.0000 mg | ORAL_TABLET | Freq: Every day | ORAL | 3 refills | Status: DC

## 2021-01-28 MED ORDER — ROSUVASTATIN CALCIUM 10 MG PO TABS: 10.0000 mg | ORAL_TABLET | Freq: Every day | ORAL | 3 refills | Status: DC

## 2021-01-28 MED ORDER — LISINOPRIL 2.5 MG PO TABS: 2.5000 mg | ORAL_TABLET | Freq: Every day | ORAL | 3 refills | Status: DC

## 2021-01-28 NOTE — Assessment & Plan Note (Signed)
Medications following CABG converted to p.o. equivalents.  Continue clopidogrel 75 mg, aspirin 81 mg, rosuvastatin 10 mg, metoprolol succinate 50 mg daily.  We will also continue with torsemide 10 mg daily.  He just had a CMP, therefore no need to repeat, will but will go ahead and obtain a lipid panel.  Previously his LDL has been within goal.  Armandina Gemma seems to be a vasodilator, he has not been having chest pain, would hold off on starting Imdur and defer to cardiology.  We will go ahead and refer him to cardiology given that he has now had an MI and CABG.  We will also refer to cardiac rehab given that he has been feeling tired and weak.  Could also consider depression contributing in the setting of heart attack and loss of his son unexpectedly.  We will have him follow-up in 1 month with me.

## 2021-01-28 NOTE — Assessment & Plan Note (Signed)
BP slightly above goal today.  We will continue him on metoprolol 50 mg daily, torsemide 10 mg daily.  He had previously been prescribed lisinopril 2.5 mg daily, but did not bring with him today.  We will go ahead and restart this as well.  Can titrate this up as needed for blood pressure control.  We will have him come back in 1 month and repeat a BMP at that time.

## 2021-01-28 NOTE — Assessment & Plan Note (Signed)
He is already following with his eye doctor and has seen them within the last week.  He is planning for injections within the next week.  His A1c today is 8.2.  He has been on Metformin 1000 mg twice daily only.  We will go ahead and restart him on Invokana 100 mg daily and continue with Metformin 1000 mg twice daily.  He can continue to check his CBGs.  We will see him back in 1 month.  We will obtain a BMP at that time.  Continue with statin and ACE per above.

## 2021-01-28 NOTE — Patient Instructions (Signed)
Thank you for coming to see me today. It was a pleasure. Today we talked about:   We will get some labs today.  If they are abnormal or we need to do something about them, I will call you.  If they are normal, I will send you a message on MyChart (if it is active) or a letter in the mail.  If you don't hear from [place] in 2 weeks, please call the office at the number below.  I have sent your medications to the pharmacy.  You will take nitroglycerin as needed for chest pain.  If you have chest pain that doesn't get better after taking, go to the emergency room right away.  I have placed a referral to Cardiology and Cardiac Rehab.  If you do not hear from them in the next 2 weeks, please give [place] a call.   Please follow-up with me in 1 month.  If you have any questions or concerns, please do not hesitate to call the office at [phone].  Best,   [place] Constable, DO

## 2021-01-28 NOTE — Assessment & Plan Note (Signed)
Obtain lipid panel today per above.  Continue with rosuvastatin 10 mg daily.

## 2021-01-28 NOTE — Assessment & Plan Note (Signed)
>>  ASSESSMENT AND PLAN FOR CORONARY ARTERY DISEASE INVOLVING NATIVE HEART WITHOUT ANGINA PECTORIS WRITTEN ON 01/28/2021  5:24 PM BY MECCARIELLO, BAILEY J, DO  Medications following CABG converted to p.o. equivalents.  Continue clopidogrel 75 mg, aspirin 81 mg, rosuvastatin 10 mg, metoprolol succinate 50 mg daily.  We will also continue with torsemide 10 mg daily.  He just had a CMP, therefore no need to repeat, will but will go ahead and obtain a lipid panel.  Previously his LDL has been within goal.  Dierdre Searles seems to be a vasodilator, he has not been having chest pain, would hold off on starting Imdur and defer to cardiology.  We will go ahead and refer him to cardiology given that he has now had an MI and CABG.  We will also refer to cardiac rehab given that he has been feeling tired and weak.  Could also consider depression contributing in the setting of heart attack and loss of his son unexpectedly.  We will have him follow-up in 1 month with me.

## 2021-01-28 NOTE — Assessment & Plan Note (Signed)
Follows with oncology.  Has recently been seen by them on 2/22.  He continues to take iron.

## 2021-01-29 LAB — LIPID PANEL
Chol/HDL Ratio: 2.9 ratio (ref 0.0–5.0)
Cholesterol, Total: 104 mg/dL (ref 100–199)
HDL: 36 mg/dL — ABNORMAL LOW (ref >39)
LDL Chol Calc (NIH): 49 mg/dL (ref 0–99)
Triglycerides: 101 mg/dL (ref 0–149)
VLDL Cholesterol Cal: 19 mg/dL (ref 5–40)

## 2021-02-02 ENCOUNTER — Ambulatory Visit (HOSPITAL_COMMUNITY): Admission: RE | Admit: 2021-02-02 | Payer: 59 | Source: Ambulatory Visit

## 2021-02-02 ENCOUNTER — Other Ambulatory Visit: Payer: Self-pay

## 2021-02-02 ENCOUNTER — Ambulatory Visit (HOSPITAL_COMMUNITY)
Admission: RE | Admit: 2021-02-02 | Discharge: 2021-02-02 | Disposition: A | Discharge status: Home / Self Care | Payer: 59 | Source: Ambulatory Visit | Attending: Hematology | Admitting: Hematology

## 2021-02-02 DIAGNOSIS — J90 Pleural effusion, not elsewhere classified: Secondary | ICD-10-CM | POA: Insufficient documentation

## 2021-02-02 DIAGNOSIS — R590 Localized enlarged lymph nodes: Secondary | ICD-10-CM | POA: Insufficient documentation

## 2021-02-02 DIAGNOSIS — C9110 Chronic lymphocytic leukemia of B-cell type not having achieved remission: Secondary | ICD-10-CM | POA: Insufficient documentation

## 2021-02-02 LAB — GLUCOSE, CAPILLARY: Glucose-Capillary: 249 mg/dL — ABNORMAL HIGH (ref 70–99)

## 2021-02-02 MED ORDER — FLUDEOXYGLUCOSE F - 18 (FDG) INJECTION
6.7000 | Freq: Once | INTRAVENOUS | Status: AC | PRN
  Administered 2021-02-02: 6.3 via INTRAVENOUS

## 2021-02-08 ENCOUNTER — Ambulatory Visit (HOSPITAL_COMMUNITY): Payer: 59

## 2021-02-09 ENCOUNTER — Telehealth: Payer: Self-pay | Admitting: *Deleted

## 2021-02-09 NOTE — Telephone Encounter (Signed)
Patient called - Had PET on 3/15 and was told by radiology that "Dr. Irene Limbo will contact you with results". He would like to talk with Dr. Irene Limbo. Dr. Irene Limbo informed.  Dr. Irene Limbo provided following response - called and talked with patient.. no significant change in his lymphoma. Please move his April appointments for labs and MD visit to July. Schedule message sent.

## 2021-02-10 ENCOUNTER — Telehealth: Payer: Self-pay | Admitting: Hematology

## 2021-02-10 NOTE — Telephone Encounter (Signed)
Scheduled appts per 3/22 sch msg. Pt aware.  

## 2021-02-10 NOTE — Progress Notes (Unsigned)
Cardiology Office Note:   Date:  02/12/2021  NAME:  Alec York    MRN: [id number] DOB:  09-30-1951   PCP:  Cleophas Dunker, DO  Cardiologist:  No primary care provider on file.  Electrophysiologist:  None   Referring MD: Martyn Malay, MD   Chief Complaint  Patient presents with  . Coronary Artery Disease    History of Present Illness:   Alec York is a 70 y.o. male with a hx of CAD s/p CABG, HLD, HTN, DM who is being seen today for the evaluation of CAD at the request of Cleophas Dunker, DO. Had CABG in [place]. Follow-up with [place].  He had bypass surgery in [place] in August 2021.  He apparently was visiting family members.  He had a non-STEMI.  Was found to have three-vessel CAD.  Underwent CABG with details below.  It appears he had a right pleural effusion after surgery.  This was not addressed.  He recently came back to [place].  He has CLL and had a CT scan as part of his cancer work-up.  He was found to have what appears to be a chronic right pleural effusion.  He does report some shortness of breath with activity.  He denies any significant chest pain or pressure.  His most recent cholesterol levels show he is at goal.  I do wonder if this pleural effusion is chronic versus congestive heart failure.  He has no evidence of volume overload on examination today.  He does need work-up for this.  His EKG in office demonstrates sinus rhythm with lateral T wave inversions.  I did review his EKGs from [place] and they show this.  Echocardiogram in [place] showed normal LV function as well.  He is a former smoker but quit several years ago.  He reports several family members who had heart disease.  He is working on his diabetes.  He presents with his wife.  He has 2 children.  He is a retired Lawyer.  Problem List 1. CAD  -CABG x 2 06/23/2020 ([place]) -LIMA-LAD, RIMA/Y graft-OM1/OM2/PDA 2. HLD -T chol 104, HDL 36, LDL 49, TG 101 3. HTN 4. DM -A1c 8.2 5. CLL 6.  Former tobacco abuse   Past Medical History: Past Medical History:  Diagnosis Date  . Allergy   . Coronary artery disease   . Diabetes mellitus without complication ([place])   . Diabetic retinopathy ([place])    Disabled 2/2 retinopathy  . GERD (gastroesophageal reflux disease)   . Hyperlipidemia   . Hypertension     Past Surgical History: Past Surgical History:  Procedure Laterality Date  . CARDIAC CATHETERIZATION    . 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   CABG x 4 [place]    Current Medications: Current Meds  Medication Sig  . Accu-Chek FastClix Lancets MISC Use to test sugars up to 4 times daily.  Dx Code: E11.319  . aspirin EC 81 MG tablet Take 1 tablet (81 mg total) by mouth daily. Swallow whole.  . blood glucose meter kit and supplies KIT Dispense based on patient and insurance preference. Use up to four times daily as directed. Dx G62.694  . Blood Glucose Monitoring Suppl (ACCU-CHEK GUIDE) w/Device KIT 1 each by Does not apply route 4 (four) times daily as needed. Use to test sugars up to 4 times daily.  Dx Code: W54.627  . Blood Pressure Monitoring (BLOOD PRESSURE CUFF) MISC 1 Units by Does not apply route daily.  [name]  canagliflozin (INVOKANA) 100 MG TABS tablet Take 1 tablet (100 mg total) by mouth daily.  . clopidogrel (PLAVIX) 75 MG tablet Take 1 tablet (75 mg total) by mouth daily.  [name] glucagon 1 MG injection Inject 1 mg into the muscle once as needed. May repeat in 15 minutes if needed.  [name] glucose blood (ACCU-CHEK GUIDE) test strip Use to test sugars up to 4 times daily.  Dx Code: J28.786  . iron polysaccharides (NIFEREX) 150 MG capsule Take 1 capsule (150 mg total) by mouth daily.  Elmore Guise Devices (ACCU-CHEK SOFTCLIX) lancets Use as instructed up to 4 times daily.  Dx V67.209  . Lancets Misc. (ACCU-CHEK FASTCLIX LANCET) KIT Use to test sugars up to 4 times daily.  Dx Code: E11.319  . lisinopril (ZESTRIL) 2.5 MG tablet Take 1 tablet (2.5 mg total) by mouth daily.  .  metFORMIN (GLUCOPHAGE) 1000 MG tablet Take 1 tablet (1,000 mg total) by mouth 2 (two) times daily with a meal.  . metoprolol succinate (TOPROL-XL) 50 MG 24 hr tablet Take 1 tablet (50 mg total) by mouth at bedtime. Take with or immediately following a meal.  . nitroGLYCERIN (NITROSTAT) 0.4 MG SL tablet Place 1 tablet (0.4 mg total) under the tongue every 5 (five) minutes as needed for chest pain.  [name] NOVOLOG FLEXPEN 100 UNIT/ML FlexPen Inject 10 Units into the skin 3 (three) times daily with meals.  [name] PRESCRIPTION MEDICATION Take 5 mg by mouth as needed (Angina/chest pain). Nikoran 5 mg - prescribed in [place]  . PRESCRIPTION MEDICATION Take 1 capsule by mouth daily at 2 PM. Rosuvastatin,ASA,Clopidogrel -- prescribed in [place].  [name] RELION PEN NEEDLES 31G X 6 MM MISC USE AS DIRECTED TO  CHECK  BLOOD  SUGAR  . rosuvastatin (CRESTOR) 10 MG tablet Take 1 tablet (10 mg total) by mouth daily.  [name] torsemide (DEMADEX) 10 MG tablet Take 1 tablet (10 mg total) by mouth daily.     Allergies:    Patient has no known allergies.   Social History: Social History   Socioeconomic History  . Marital status: Married    Spouse name: Not on file  . Number of children: 2  . Years of education: Not on file  . Highest education level: Not on file  Occupational History  . Occupation: Retired  Tobacco Use  . Smoking status: Former Smoker    Years: 10.00  . Smokeless tobacco: Never Used  Vaping Use  . Vaping Use: Never used  Substance and Sexual Activity  . Alcohol use: No    Alcohol/week: 0.0 standard drinks  . Drug use: No  . Sexual activity: Yes    Birth control/protection: None  Other Topics Concern  . Not on file  Social History Narrative  . Not on file   Social Determinants of Health   Financial Resource Strain: Not on file  Food Insecurity: Not on file  Transportation Needs: Not on file  Physical Activity: Not on file  Stress: Not on file  Social Connections: Not on file     Family  History: The patient's family history includes Heart disease in his mother. There is no history of Diabetes.  ROS:   All other ROS reviewed and negative. Pertinent positives noted in the HPI.     EKGs/Labs/Other Studies Reviewed:   The following studies were personally reviewed by me today:  EKG:  EKG is ordered today.  The ekg ordered today demonstrates normal sinus rhythm heart rate 81, lateral T wave inversions noted,  and was personally reviewed by me.   Recent Labs: 01/12/2021: ALT 9; BUN 17; Creatinine 1.08; Hemoglobin 10.9; Platelet Count 216; Potassium 5.0; Sodium 137   Recent Lipid Panel    Component Value Date/Time   CHOL 104 01/28/2021 1449   TRIG 101 01/28/2021 1449   HDL 36 (L) 01/28/2021 1449   CHOLHDL 2.9 01/28/2021 1449   CHOLHDL 2.9 05/31/2016 0843   VLDL 32 (H) 05/31/2016 0843   LDLCALC 49 01/28/2021 1449    Physical Exam:   VS:  BP 136/66 (BP Location: Right Arm, Patient Position: Sitting, Cuff Size: Normal)   Pulse 81   Ht 5' 8"  (1.727 m)   Wt 125 lb 6.4 oz (56.9 kg)   BMI 19.07 kg/m    Wt Readings from Last 3 Encounters:  02/12/21 125 lb 6.4 oz (56.9 kg)  01/28/21 129 lb 6.4 oz (58.7 kg)  01/12/21 126 lb 9.6 oz (57.4 kg)    General: Well nourished, well developed, in no acute distress Head: Atraumatic, normal size  Eyes: PEERLA, EOMI  Neck: Supple, no JVD Endocrine: No thryomegaly Cardiac: Normal S1, S2; RRR; no murmurs, rubs, or gallops Lungs: Diminished right lung breath sounds Abd: Soft, nontender, no hepatomegaly  Ext: No edema, pulses 2+ Musculoskeletal: No deformities, BUE and BLE strength normal and equal Skin: Warm and dry, no rashes   Neuro: Alert and oriented to person, place, time, and situation, CNII-XII grossly intact, no focal deficits  Psych: Normal mood and affect   ASSESSMENT:   Alec York is a 70 y.o. male who presents for the following: 1. Coronary artery disease involving coronary bypass graft of native heart without  angina pectoris   2. SOB (shortness of breath)   3. Mixed hyperlipidemia   4. Recurrent pleural effusion on right   5. Primary hypertension     PLAN:   1. Coronary artery disease involving coronary bypass graft of native heart without angina pectoris -CABG in [place] in August 2021.  Seems to be doing well.  He does have a chronic right pleural effusion.  I wonder if this explains his shortness of breath.  He needs a BNP and repeat echocardiogram. -He will continue aspirin and Plavix.  Apparently he had a heart attack.  We will plan to stop his Plavix after 1 year of therapy which will be August 2022. -No symptoms of angina.  Blood pressure well controlled today.  136/66.  Continue lisinopril 2.5 mg daily and metoprolol succinate 50 mg daily. -Most recent LDL cholesterol is at goal.  He will continue Crestor 10 mg daily.  His LDL is 49.  Triglycerides are acceptable.  2. SOB (shortness of breath) -Pleural effusion seen on CT chest for part of cancer work-up.  I would like to check a BNP.  He does not appear grossly volume overloaded.  We need to make sure he does not have congestive heart failure.  He also needs a repeat echocardiogram.  This simply could be a right pleural effusion that is chronic from surgery that was not addressed.  I did review some of the images he brought with me from [place].  He did have a right pleural effusion after surgery.  This was never drained.  3. Mixed hyperlipidemia -Cholesterol level at goal.  Continue Crestor.  4. Recurrent pleural effusion on right -See discussion of shortness of breath above.  Chronic right pleural effusion.  We will check a BNP and echocardiogram.  He may need to strain.  I will discuss  this with pulmonary.  5. Primary hypertension -BP is well controlled.  No change in medications.  Disposition: Return in about 1 month (around 03/15/2021).  Medication Adjustments/Labs and Tests Ordered: Current medicines are reviewed at length with the  patient today.  Concerns regarding medicines are outlined above.  Orders Placed This Encounter  Procedures  . Brain natriuretic peptide  . EKG 12-Lead  . ECHOCARDIOGRAM COMPLETE   No orders of the defined types were placed in this encounter.   Patient Instructions  Medication Instructions:  The current medical regimen is effective;  continue present plan and medications.  *If you need a refill on your cardiac medications before your next appointment, please call your pharmacy*   Lab Work: BNP today   If you have labs (blood work) drawn today and your tests are completely normal, you will receive your results only by: [name] MyChart Message (if you have MyChart) OR . A paper copy in the mail If you have any lab test that is abnormal or we need to change your treatment, we will call you to review the results.   Testing/Procedures: Echocardiogram - Your physician has requested that you have an echocardiogram. Echocardiography is a painless test that uses sound waves to create images of your heart. It provides your doctor with information about the size and shape of your heart and how well your heart's chambers and valves are working. This procedure takes approximately one hour. There are no restrictions for this procedure. This will be performed at our [facility] location - [address], [address].    Follow-Up: At [facility], you and your health needs are our priority.  As part of our continuing mission to provide you with exceptional heart care, we have created designated Provider Care Teams.  These Care Teams include your primary Cardiologist (physician) and Advanced Practice Providers (APPs -  Physician Assistants and Nurse Practitioners) who all work together to provide you with the care you need, when you need it.  We recommend signing up for the patient portal called "MyChart".  Sign up information is provided on this After Visit Summary.  MyChart is used to connect with  patients for Virtual Visits (Telemedicine).  Patients are able to view lab/test results, encounter notes, upcoming appointments, etc.  Non-urgent messages can be sent to your provider as well.   To learn more about what you can do with MyChart, go to [email or web address].    Your next appointment:   4 week(s)  The format for your next appointment:   In Person  Provider:   Eleonore Chiquito, MD          Signed, Addison Naegeli. Audie Box, MD, [place]  [address] [place], [place] [address] [phone]  02/12/2021 9:08 AM

## 2021-02-12 ENCOUNTER — Other Ambulatory Visit: Payer: Self-pay

## 2021-02-12 ENCOUNTER — Ambulatory Visit (INDEPENDENT_AMBULATORY_CARE_PROVIDER_SITE_OTHER): Payer: 59 | Admitting: Cardiovascular Disease

## 2021-02-12 ENCOUNTER — Encounter: Payer: Self-pay | Admitting: Cardiovascular Disease

## 2021-02-12 VITALS — BP 136/66 | HR 81 | Ht 68.0 in | Wt 125.4 lb

## 2021-02-12 DIAGNOSIS — R0602 Shortness of breath: Secondary | ICD-10-CM

## 2021-02-12 DIAGNOSIS — E782 Mixed hyperlipidemia: Secondary | ICD-10-CM | POA: Diagnosis not present

## 2021-02-12 DIAGNOSIS — J90 Pleural effusion, not elsewhere classified: Secondary | ICD-10-CM | POA: Diagnosis not present

## 2021-02-12 DIAGNOSIS — I10 Essential (primary) hypertension: Secondary | ICD-10-CM

## 2021-02-12 DIAGNOSIS — I25810 Atherosclerosis of coronary artery bypass graft(s) without angina pectoris: Secondary | ICD-10-CM | POA: Diagnosis not present

## 2021-02-12 NOTE — Patient Instructions (Signed)
Medication Instructions:  The current medical regimen is effective;  continue present plan and medications.  *If you need a refill on your cardiac medications before your next appointment, please call your pharmacy*   Lab Work: BNP today   If you have labs (blood work) drawn today and your tests are completely normal, you will receive your results only by: [name] MyChart Message (if you have MyChart) OR . A paper copy in the mail If you have any lab test that is abnormal or we need to change your treatment, we will call you to review the results.   Testing/Procedures: Echocardiogram - Your physician has requested that you have an echocardiogram. Echocardiography is a painless test that uses sound waves to create images of your heart. It provides your doctor with information about the size and shape of your heart and how well your heart's chambers and valves are working. This procedure takes approximately one hour. There are no restrictions for this procedure. This will be performed at our [facility] location - [address], [address].    Follow-Up: At [facility], you and your health needs are our priority.  As part of our continuing mission to provide you with exceptional heart care, we have created designated Provider Care Teams.  These Care Teams include your primary Cardiologist (physician) and Advanced Practice Providers (APPs -  Physician Assistants and Nurse Practitioners) who all work together to provide you with the care you need, when you need it.  We recommend signing up for the patient portal called "MyChart".  Sign up information is provided on this After Visit Summary.  MyChart is used to connect with patients for Virtual Visits (Telemedicine).  Patients are able to view lab/test results, encounter notes, upcoming appointments, etc.  Non-urgent messages can be sent to your provider as well.   To learn more about what you can do with MyChart, go to [email or web address].     Your next appointment:   4 week(s)  The format for your next appointment:   In Person  Provider:   Eleonore Chiquito, MD

## 2021-02-15 LAB — BRAIN NATRIURETIC PEPTIDE: BNP: 134.8 pg/mL — ABNORMAL HIGH (ref 0.0–100.0)

## 2021-02-18 ENCOUNTER — Other Ambulatory Visit: Payer: Self-pay

## 2021-02-18 DIAGNOSIS — J90 Pleural effusion, not elsewhere classified: Secondary | ICD-10-CM

## 2021-03-02 ENCOUNTER — Telehealth: Payer: Self-pay

## 2021-03-02 NOTE — Telephone Encounter (Signed)
Received fax from pharmacy, PA needed on [place]. Clinical questions submitted via Cover My Meds. Waiting on response, could take up to 72 hours.  Cover My Meds info: Key: [id number]   Of note, patient needs to try and fail Jardiance and/or [place].

## 2021-03-03 ENCOUNTER — Other Ambulatory Visit: Payer: Self-pay | Admitting: Family Medicine

## 2021-03-03 DIAGNOSIS — E11311 Type 2 diabetes mellitus with unspecified diabetic retinopathy with macular edema: Secondary | ICD-10-CM

## 2021-03-03 MED ORDER — EMPAGLIFLOZIN 10 MG PO TABS: 10.0000 mg | ORAL_TABLET | Freq: Every day | ORAL | 0 refills | Status: DC

## 2021-03-03 NOTE — Telephone Encounter (Signed)
Looks like she restarted Invokana as he had been previously on this, suspecting this may may have been his insurance's previous preferred SGLT2 inhibitor.  From chart review does not seem like he has had any difficulty with Jardiance or Wilder Glade in the past.  Will Rx Jardiance 10 mg.  Patriciaann Clan, DO

## 2021-03-03 NOTE — Telephone Encounter (Signed)
Medication denied. Patient needs to try and fail [place] and [place].

## 2021-03-03 NOTE — Telephone Encounter (Signed)
Called patient to inform of medication switch.  He states that he actually has a few Invokana pills left and has been taking it (unclear if he somehow was able to pick up a month supply prior to his insurance denying it as through Dr. Ernst Bowler note he had reported he was out for 2 months when seen in 01/2021).  Discussed transition to Jardiance instead.  He is going to finish his Invokana pills and then start the Jardiance in addition to his Metformin.  He feels like his diabetes is still not under good control and would like to discuss a third medication when he sees his PCP on 4/18.  Patriciaann Clan, DO

## 2021-03-08 ENCOUNTER — Ambulatory Visit: Payer: 59 | Admitting: Family Medicine

## 2021-03-08 ENCOUNTER — Other Ambulatory Visit: Payer: Self-pay

## 2021-03-08 ENCOUNTER — Encounter: Payer: Self-pay | Admitting: Family Medicine

## 2021-03-08 ENCOUNTER — Ambulatory Visit (INDEPENDENT_AMBULATORY_CARE_PROVIDER_SITE_OTHER): Payer: 59 | Admitting: Family Medicine

## 2021-03-08 VITALS — BP 127/53 | HR 51 | Ht 68.0 in | Wt 122.6 lb

## 2021-03-08 DIAGNOSIS — I10 Essential (primary) hypertension: Secondary | ICD-10-CM | POA: Diagnosis not present

## 2021-03-08 DIAGNOSIS — Z794 Long term (current) use of insulin: Secondary | ICD-10-CM

## 2021-03-08 DIAGNOSIS — G4700 Insomnia, unspecified: Secondary | ICD-10-CM

## 2021-03-08 DIAGNOSIS — E11311 Type 2 diabetes mellitus with unspecified diabetic retinopathy with macular edema: Secondary | ICD-10-CM | POA: Diagnosis not present

## 2021-03-08 DIAGNOSIS — I2510 Atherosclerotic heart disease of native coronary artery without angina pectoris: Secondary | ICD-10-CM

## 2021-03-08 NOTE — Assessment & Plan Note (Signed)
PHQ-9 score is not significantly elevated today, however he has had a significant amount of stress in his life with the loss of many family members and CABG within the last year.  Suspect that this is significantly contributing to his symptoms.  He is currently resistant to any treatment at this time and would like to take care of it on his own.  We will continue to assess.

## 2021-03-08 NOTE — Assessment & Plan Note (Signed)
>>  ASSESSMENT AND PLAN FOR CORONARY ARTERY DISEASE INVOLVING NATIVE HEART WITHOUT ANGINA PECTORIS WRITTEN ON 03/08/2021 11:54 AM BY MECCARIELLO, Solmon Ice, DO  Following with cardiology.  Doing well.  Continue with his current regimen.  He has an echo coming up on the 27th.

## 2021-03-08 NOTE — Assessment & Plan Note (Signed)
BP well controlled today.  His pulse is a little on the lower side and diastolic blood pressure is also on the lower side.  Could consider decreasing metoprolol in the future.  He has a follow-up appointment with cardiology, will wait and see if they make adjustments.  Overall he is feeling well.  Continue with his current regimen.

## 2021-03-08 NOTE — Assessment & Plan Note (Signed)
His CBGs are currently above goal.  With changing to [facility], he will start on Jardiance 10 mg.  Discussed with him that it would like to recheck his BMP prior to increasing this to his maximum dose of 25 mg.  Would also not add a third agent until we have titrated up his Jardiance.  He agrees to this.  He will come back in 1 month, have BMP at that time.

## 2021-03-08 NOTE — Patient Instructions (Signed)
Thank you for coming to see me today. It was a pleasure. Today we talked about:   Everything is looking good.    Please follow-up with me in 1 month.  If you have any questions or concerns, please do not hesitate to call the office at [phone].  Best,   [place] Constable, DO

## 2021-03-08 NOTE — Assessment & Plan Note (Signed)
Following with cardiology.  Doing well.  Continue with his current regimen.  He has an echo coming up on the 27th.

## 2021-03-08 NOTE — Progress Notes (Signed)
    SUBJECTIVE:   CHIEF COMPLAINT / HPI:   CAD s/p CABG with HTN Last seen on 01/28/2021 after returning from [place] Patient has been seen by cardiology on 02/12/2021 Current regimen: Aspirin, Plavix, lisinopril 2.5 mg daily, metoprolol 50 mg daily, Crestor 10 mg daily Last LDL 49 Has effusion with elevated BNP, has echo scheduled for 4/27 On torsemide 10mg  Denies Chest pain Has nitro to use prn and has not needed it  T2DM Current regimen: Still on invokana, changing to jardiance 10mg  for diabetes, metoformin 100mg  BID CBGs 200s at home, usually more than 250 Last A1c 8.2 on 3/10 Had eye appointment last week and had injections  Insomnia Patient reports that he has been having difficulty falling asleep because he stays awake and thinks about everything He reports that after his son passed, and he had his heart attack, he is also lost other family members and this has been very troubling for him He reports that he does feel somewhat depressed No SI  PERTINENT  PMH / PSH: HTN, PAD, CAD status post CABG, T2DM with diabetic retinopathy, HLD, CLL  OBJECTIVE:   BP (!) 127/53   Pulse (!) 51   Ht 5\' 8"  (1.727 m)   Wt 122 lb 9.6 oz (55.6 kg)   SpO2 (!) 64%   BMI 18.64 kg/m    Physical Exam:  General: 70 y.o. male in NAD Cardio: RRR no m/r/g Lungs: CTAB, no wheezing, no rhonchi, no crackles, no IWOB on RA Skin: warm and dry Extremities: No edema Psych: Mood and affect appropriate for circumstance, appropriate dress, thought process linear and logical, good insight, no SI    ASSESSMENT/PLAN:   Essential hypertension BP well controlled today.  His pulse is a little on the lower side and diastolic blood pressure is also on the lower side.  Could consider decreasing metoprolol in the future.  He has a follow-up appointment with cardiology, will wait and see if they make adjustments.  Overall he is feeling well.  Continue with his current regimen.  Coronary artery disease  involving native heart without angina pectoris Following with cardiology.  Doing well.  Continue with his current regimen.  He has an echo coming up on the 27th.  Type 2 diabetes mellitus with diabetic retinopathy His CBGs are currently above goal.  With changing to [facility], he will start on Jardiance 10 mg.  Discussed with him that it would like to recheck his BMP prior to increasing this to his maximum dose of 25 mg.  Would also not add a third agent until we have titrated up his Jardiance.  He agrees to this.  He will come back in 1 month, have BMP at that time.  Insomnia PHQ-9 score is not significantly elevated today, however he has had a significant amount of stress in his life with the loss of many family members and CABG within the last year.  Suspect that this is significantly contributing to his symptoms.  He is currently resistant to any treatment at this time and would like to take care of it on his own.  We will continue to assess.     Alec York, [place]

## 2021-03-16 ENCOUNTER — Ambulatory Visit: Payer: Medicare HMO | Admitting: Hematology

## 2021-03-16 ENCOUNTER — Other Ambulatory Visit: Payer: Medicare HMO

## 2021-03-17 ENCOUNTER — Other Ambulatory Visit: Payer: Self-pay

## 2021-03-17 ENCOUNTER — Ambulatory Visit (HOSPITAL_COMMUNITY): Payer: 59 | Attending: Internal Medicine

## 2021-03-17 DIAGNOSIS — R0602 Shortness of breath: Secondary | ICD-10-CM | POA: Diagnosis not present

## 2021-03-17 LAB — ECHOCARDIOGRAM COMPLETE
Area-P 1/2: 5.02 cm2
S' Lateral: 1.9 cm

## 2021-03-17 MED ORDER — PERFLUTREN LIPID MICROSPHERE
1.0000 mL | INTRAVENOUS | Status: AC | PRN
  Administered 2021-03-17: 2 mL via INTRAVENOUS

## 2021-03-26 ENCOUNTER — Institutional Professional Consult (permissible substitution): Payer: 59 | Admitting: Pulmonary Disease

## 2021-04-02 ENCOUNTER — Other Ambulatory Visit: Payer: Self-pay | Admitting: Family Medicine

## 2021-04-02 DIAGNOSIS — E11311 Type 2 diabetes mellitus with unspecified diabetic retinopathy with macular edema: Secondary | ICD-10-CM

## 2021-04-08 NOTE — Progress Notes (Signed)
    SUBJECTIVE:   CHIEF COMPLAINT / HPI:   T2DM Current regimen: Jardiance 10 mg, metformin 1000 mg twice daily CBGs more than 200 Last A1c 8.2 on 3/10 Follows with ophthalmology On ACE and statin  CAD s/p CABG with HTN Following with cardiology Had an echo in April that was normal Currently on aspirin, Plavix, lisinopril, metoprolol, Crestor, torsemide Doing well.  Continues to have dyspnea on exertion that is stable  PERTINENT  PMH / PSH: HTN, PAD, CAD s/p CABG, T2DM with diabetic retinopathy, HLD, CLL  OBJECTIVE:   BP (!) 98/52   Pulse 76   Ht 5\' 8"  (1.727 m)   Wt 118 lb 10.6 oz (53.8 kg)   SpO2 98%   BMI 18.04 kg/m    Physical Exam:  General: 70 y.o. male in NAD, thin Cardio: RRR no m/r/g  Lungs: CTAB, no wheezing, no rhonchi, no crackles, no IWOB on RA Skin: warm and dry Extremities: No edema   ASSESSMENT/PLAN:   Type 2 diabetes mellitus with diabetic retinopathy CBGs continue to be above goal.  We will obtain a BMP today and increase patient's Jardiance to 25 mg.  Continue metformin.  On an ACE and statin.  We will follow-up in 4 weeks and repeat BMP at that time as well given increase in [place].  He may be started on a GLP in the future as well.  Coronary artery disease involving native heart without angina pectoris Doing well.  Following with cardiology.  Continue current regimen.  BP is slightly low today, he is not symptomatic.  Could consider decreasing dose of metoprolol.  Essential hypertension Continue with current regimen.  His BP is slightly low.  See thoughts above.     Cleophas Dunker, [place]

## 2021-04-09 ENCOUNTER — Ambulatory Visit: Payer: 59

## 2021-04-09 ENCOUNTER — Encounter: Payer: Self-pay | Admitting: Family Medicine

## 2021-04-09 ENCOUNTER — Ambulatory Visit (INDEPENDENT_AMBULATORY_CARE_PROVIDER_SITE_OTHER): Payer: 59 | Admitting: Family Medicine

## 2021-04-09 ENCOUNTER — Other Ambulatory Visit: Payer: Self-pay

## 2021-04-09 ENCOUNTER — Ambulatory Visit: Payer: 59 | Admitting: Family Medicine

## 2021-04-09 VITALS — BP 98/52 | HR 76 | Ht 68.0 in | Wt 118.7 lb

## 2021-04-09 DIAGNOSIS — I2510 Atherosclerotic heart disease of native coronary artery without angina pectoris: Secondary | ICD-10-CM

## 2021-04-09 DIAGNOSIS — Z794 Long term (current) use of insulin: Secondary | ICD-10-CM | POA: Diagnosis not present

## 2021-04-09 DIAGNOSIS — E11311 Type 2 diabetes mellitus with unspecified diabetic retinopathy with macular edema: Secondary | ICD-10-CM

## 2021-04-09 DIAGNOSIS — I10 Essential (primary) hypertension: Secondary | ICD-10-CM | POA: Diagnosis not present

## 2021-04-09 MED ORDER — METFORMIN HCL 1000 MG PO TABS: 1000.0000 mg | ORAL_TABLET | Freq: Two times a day (BID) | ORAL | 1 refills | Status: DC

## 2021-04-09 MED ORDER — EMPAGLIFLOZIN 25 MG PO TABS: 25.0000 mg | ORAL_TABLET | Freq: Every day | ORAL | 1 refills | Status: DC

## 2021-04-09 NOTE — Patient Instructions (Signed)
Thank you for coming to see me today. It was a pleasure. Today we talked about:   We will get some labs today.  If they are abnormal or we need to do something about them, I will call you.  If they are normal, I will send you a message on MyChart (if it is active) or a letter in the mail.  If you don't hear from [place] in 2 weeks, please call the office at the number below.  We will increase your jardiance to 25mg .  Continue to check your sugars.  Please follow-up with me in 1 month.  If you have any questions or concerns, please do not hesitate to call the office at [phone].  Best,   [place] Constable, DO

## 2021-04-09 NOTE — Assessment & Plan Note (Signed)
CBGs continue to be above goal.  We will obtain a BMP today and increase patient's Jardiance to 25 mg.  Continue metformin.  On an ACE and statin.  We will follow-up in 4 weeks and repeat BMP at that time as well given increase in [place].  He may be started on a GLP in the future as well.

## 2021-04-09 NOTE — Assessment & Plan Note (Signed)
Doing well.  Following with cardiology.  Continue current regimen.  BP is slightly low today, he is not symptomatic.  Could consider decreasing dose of metoprolol.

## 2021-04-09 NOTE — Assessment & Plan Note (Signed)
>>  ASSESSMENT AND PLAN FOR CORONARY ARTERY DISEASE INVOLVING NATIVE HEART WITHOUT ANGINA PECTORIS WRITTEN ON 04/09/2021 11:21 AM BY MECCARIELLO, BAILEY J, DO  Doing well.  Following with cardiology.  Continue current regimen.  BP is slightly low today, he is not symptomatic.  Could consider decreasing dose of metoprolol.

## 2021-04-09 NOTE — Assessment & Plan Note (Signed)
Continue with current regimen.  His BP is slightly low.  See thoughts above.

## 2021-04-10 LAB — BASIC METABOLIC PANEL
BUN/Creatinine Ratio: 18 (ref 10–24)
BUN: 19 mg/dL (ref 8–27)
CO2: 23 mmol/L (ref 20–29)
Calcium: 9.9 mg/dL (ref 8.6–10.2)
Chloride: 95 mmol/L — ABNORMAL LOW (ref 96–106)
Creatinine, Ser: 1.07 mg/dL (ref 0.76–1.27)
Glucose: 283 mg/dL — ABNORMAL HIGH (ref 65–99)
Potassium: 4.4 mmol/L (ref 3.5–5.2)
Sodium: 135 mmol/L (ref 134–144)
eGFR: 76 mL/min/{1.73_m2} (ref >59)

## 2021-04-15 ENCOUNTER — Telehealth: Payer: Self-pay

## 2021-04-15 NOTE — Telephone Encounter (Signed)
Their PAD can be evaluated at the 6/23 appointment

## 2021-04-15 NOTE — Telephone Encounter (Signed)
Berton Lan, NP with [facility] calls nurse line to report abnormal finding. Reports that patient had abnormal PAD screening in both legs.   Patient has follow up appointment on 6/23. Please advise if patient should schedule additional appointment for this concern.   Talbot Grumbling, RN

## 2021-04-19 NOTE — Progress Notes (Signed)
Cardiology Office Note:   Date:  04/21/2021  NAME:  Alec York    MRN: [id number] DOB:  January 25, 1951   PCP:  Cleophas Dunker, DO  Cardiologist:  None  Electrophysiologist:  None   Referring MD: Cleophas Dunker, *   Chief Complaint  Patient presents with  . Follow-up        History of Present Illness:   Alec York is a 70 y.o. male with a hx of CAD s/p CABG, HTN, HLD, DM, Lymphoma who presents for follow-up.  He reports he is dizzy and lightheaded.  BP 118/58.  He reports it is low at home.  He has stopped his lisinopril.  He continues to take torsemide which is likely not needed.  He has a layered right pleural effusion.  He has no evidence of volume overload.  I reviewed his echocardiogram and he has normal diastolic function.  His weight has decreased over the last few months from 129 pounds to 119 pounds.  He reports he is eating well.  He informed that his cancer is stable.  He will see his cancer specialist in July.  He did not see pulmonary for the right pleural effusion.  There may have been some miscommunication with this.  He will reach back out to them.  We will help facilitate this.  He also reports he has pain in his ankles and legs.  He reports this occurs when he walks.  He has very poor pulses.  He needs vascular ultrasounds.  He denies any chest pain.  His breathing seems to be improved.  He is not exercising that much.  He is working on his diabetes.  All of his cholesterol numbers were at goal at her last visit.  Problem List 1. CAD  -CABG x 2 06/23/2020 ([place]) -LIMA-LAD, RIMA/Y graft-OM1/OM2/PDA 2. HLD -T chol 104, HDL 36, LDL 49, TG 101 3. HTN 4. DM -A1c 8.2 5. Stage IV Marginal Zone Lymphoma  6. Former tobacco abuse  7. Right chronic pleural effusion   Past Medical History: Past Medical History:  Diagnosis Date  . Allergy   . Coronary artery disease   . Diabetes mellitus without complication ([place])   . Diabetic retinopathy ([place])     Disabled 2/2 retinopathy  . GERD (gastroesophageal reflux disease)   . Hyperlipidemia   . Hypertension     Past Surgical History: Past Surgical History:  Procedure Laterality Date  . CARDIAC CATHETERIZATION    . 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   CABG x 4 [place]    Current Medications: Current Meds  Medication Sig  . Accu-Chek FastClix Lancets MISC Use to test sugars up to 4 times daily.  Dx Code: E11.319  . aspirin EC 81 MG tablet Take 1 tablet (81 mg total) by mouth daily. Swallow whole.  . blood glucose meter kit and supplies KIT Dispense based on patient and insurance preference. Use up to four times daily as directed. Dx W62.035  . Blood Glucose Monitoring Suppl (ACCU-CHEK GUIDE) w/Device KIT 1 each by Does not apply route 4 (four) times daily as needed. Use to test sugars up to 4 times daily.  Dx Code: D97.416  . Blood Pressure Monitoring (BLOOD PRESSURE CUFF) MISC 1 Units by Does not apply route daily.  . clopidogrel (PLAVIX) 75 MG tablet Take 1 tablet (75 mg total) by mouth daily.  . empagliflozin (JARDIANCE) 25 MG TABS tablet Take 1 tablet (25 mg total) by mouth daily.  [name] glucose  blood (ACCU-CHEK GUIDE) test strip Use to test sugars up to 4 times daily.  Dx Code: O32.919  . iron polysaccharides (NIFEREX) 150 MG capsule Take 1 capsule (150 mg total) by mouth daily.  Elmore Guise Devices (ACCU-CHEK SOFTCLIX) lancets Use as instructed up to 4 times daily.  Dx T66.060  . Lancets Misc. (ACCU-CHEK FASTCLIX LANCET) KIT Use to test sugars up to 4 times daily.  Dx Code: E11.319  . metFORMIN (GLUCOPHAGE) 1000 MG tablet Take 1 tablet (1,000 mg total) by mouth 2 (two) times daily with a meal.  . metoprolol succinate (TOPROL-XL) 50 MG 24 hr tablet Take 1 tablet (50 mg total) by mouth at bedtime. Take with or immediately following a meal.  . RELION PEN NEEDLES 31G X 6 MM MISC USE AS DIRECTED TO  CHECK  BLOOD  SUGAR  . rosuvastatin (CRESTOR) 10 MG tablet Take 1 tablet (10 mg total) by  mouth daily.  . [DISCONTINUED] lisinopril (ZESTRIL) 2.5 MG tablet Take 1 tablet (2.5 mg total) by mouth daily.  . [DISCONTINUED] torsemide (DEMADEX) 10 MG tablet Take 1 tablet (10 mg total) by mouth daily.     Allergies:    Patient has no known allergies.   Social History: Social History   Socioeconomic History  . Marital status: Married    Spouse name: Not on file  . Number of children: 2  . Years of education: Not on file  . Highest education level: Not on file  Occupational History  . Occupation: Retired  Tobacco Use  . Smoking status: Former Smoker    Years: 10.00  . Smokeless tobacco: Never Used  Vaping Use  . Vaping Use: Never used  Substance and Sexual Activity  . Alcohol use: No    Alcohol/week: 0.0 standard drinks  . Drug use: No  . Sexual activity: Yes    Birth control/protection: None  Other Topics Concern  . Not on file  Social History Narrative  . Not on file   Social Determinants of Health   Financial Resource Strain: Not on file  Food Insecurity: Not on file  Transportation Needs: Not on file  Physical Activity: Not on file  Stress: Not on file  Social Connections: Not on file     Family History: The patient's family history includes Heart disease in his mother. There is no history of Diabetes.  ROS:   All other ROS reviewed and negative. Pertinent positives noted in the HPI.     EKGs/Labs/Other Studies Reviewed:   The following studies were personally reviewed by me today:  NM PET 02/02/2021 IMPRESSION: Lower cervical and thoracic lymphadenopathy, as described above, without appreciable/significant change.  Moderate right and small left pleural effusions, likely chronic, although new from 2019. Associated lower lobe atelectasis.  Spleen is normal in size. No findings suspicious for lymphoma below the diaphragm.  TTE 03/17/2021 1. Left ventricular ejection fraction, by estimation, is 70 to 75%. The  left ventricle has hyperdynamic  function. No significant midcavitary  gradient demonstrated. The left ventricle has no regional wall motion  abnormalities. Left ventricular diastolic  parameters are indeterminate.  2. Right ventricular systolic function is normal. The right ventricular  size is normal.  3. The mitral valve is normal in structure. Trivial mitral valve  regurgitation. No evidence of mitral stenosis.  4. The aortic valve is grossly normal. Aortic valve regurgitation is not  visualized. No aortic stenosis is present.  5. The inferior vena cava is normal in size with greater than 50%  respiratory variability, suggesting right atrial pressure of 3 mmHg.   Recent Labs: 01/12/2021: ALT 9; Hemoglobin 10.9; Platelet Count 216 02/12/2021: BNP 134.8 04/09/2021: BUN 19; Creatinine, Ser 1.07; Potassium 4.4; Sodium 135   Recent Lipid Panel    Component Value Date/Time   CHOL 104 01/28/2021 1449   TRIG 101 01/28/2021 1449   HDL 36 (L) 01/28/2021 1449   CHOLHDL 2.9 01/28/2021 1449   CHOLHDL 2.9 05/31/2016 0843   VLDL 32 (H) 05/31/2016 0843   LDLCALC 49 01/28/2021 1449    Physical Exam:   VS:  BP (!) 118/58   Pulse 70   Ht 5' 8"  (1.727 m)   Wt 119 lb 6.4 oz (54.2 kg)   SpO2 97%   BMI 18.15 kg/m    Wt Readings from Last 3 Encounters:  04/21/21 119 lb 6.4 oz (54.2 kg)  04/09/21 118 lb 10.6 oz (53.8 kg)  03/08/21 122 lb 9.6 oz (55.6 kg)    General: Well nourished, well developed, in no acute distress Head: Atraumatic, normal size  Eyes: PEERLA, EOMI  Neck: Supple, no JVD Endocrine: No thryomegaly Cardiac: Normal S1, S2; RRR; no murmurs, rubs, or gallops Lungs: Clear to auscultation bilaterally, no wheezing, rhonchi or rales  Abd: Soft, nontender, no hepatomegaly  Ext: No edema, diminished pulses in the bilateral lower extremities Musculoskeletal: No deformities, BUE and BLE strength normal and equal Skin: Warm and dry, no rashes   Neuro: Alert and oriented to person, place, time, and situation,  CNII-XII grossly intact, no focal deficits  Psych: Normal mood and affect   ASSESSMENT:   Alec York is a 70 y.o. male who presents for the following: 1. Recurrent pleural effusion on right   2. Coronary artery disease involving coronary bypass graft of native heart without angina pectoris   3. SOB (shortness of breath)   4. Mixed hyperlipidemia   5. Leg pain, bilateral     PLAN:   1. Recurrent pleural effusion on right -He has a layered right pleural effusion.  This does not appear to be related to congestive heart failure.  He has normal diastolic function in my opinion.  I would like for him to stop his torsemide.  His blood pressure is a little too low.  We will have him reach back out to pulmonary for evaluation.  I do wonder if this is related to his malignancy.  His symptoms of shortness of breath are not that severe but this pleural effusion needs to be evaluated.  2. Coronary artery disease involving coronary bypass graft of native heart without angina pectoris -CABG x2 in August 2021 in [place].  LIMA to LAD, RIMA with Y graft to OM1 and OM 2 and PDA.  No symptoms of angina.  Most recent echocardiogram shows normal LV function without wall motion normality. -He has normal diastolic function.  He has an elevated E/E prime related to postoperative septal motion.  Lateral tissue Doppler on the mitral valve annulus is normal.  No evidence of volume overload. -Would recommend he continue aspirin and Plavix for now.  He did have a non-STEMI in [place].  We will transition him off Plavix after 1 year of therapy. -He will continue his beta-blocker. -BP a little too low.  We will stop lisinopril and torsemide.  He has no evidence of volume overload. -LDL at goal. Continue statin.   3. SOB (shortness of breath) -Symptoms are improving.  Still has this right pleural effusion.  I would like for this to  be evaluated by pulmonary.  4. Mixed hyperlipidemia -Most recent LDL is at goal.  LDL  49.  Continue Crestor 10 mg daily.  5. Leg pain, bilateral -He has bilateral leg pain when he walks.  He has very poor pulses.  We will obtain lower extremity arterial duplexes.  Disposition: Return in about 6 months (around 10/21/2021).  Medication Adjustments/Labs and Tests Ordered: Current medicines are reviewed at length with the patient today.  Concerns regarding medicines are outlined above.  Orders Placed This Encounter  Procedures  . VAS [place] LOWER EXTREMITY ARTERIAL DUPLEX   No orders of the defined types were placed in this encounter.   Patient Instructions  Medication Instructions:  Stop Lisinopril Stop Torsemide   *If you need a refill on your cardiac medications before your next appointment, please call your pharmacy*   Testing/Procedures: Your physician has requested that you have a lower extremity arterial duplex. This test is an ultrasound of the arteries in the legs. It looks at arterial blood flow in the legs. Allow one hour for Lower Arterial scans. There are no restrictions or special instructions   Follow-Up: At [facility], you and your health needs are our priority.  As part of our continuing mission to provide you with exceptional heart care, we have created designated Provider Care Teams.  These Care Teams include your primary Cardiologist (physician) and Advanced Practice Providers (APPs -  Physician Assistants and Nurse Practitioners) who all work together to provide you with the care you need, when you need it.  We recommend signing up for the patient portal called "MyChart".  Sign up information is provided on this After Visit Summary.  MyChart is used to connect with patients for Virtual Visits (Telemedicine).  Patients are able to view lab/test results, encounter notes, upcoming appointments, etc.  Non-urgent messages can be sent to your provider as well.   To learn more about what you can do with MyChart, go to [email or web address].    Your next  appointment:   6 month(s)  The format for your next appointment:   In Person  Provider:   Eleonore Chiquito, MD   Dr.Ellisons (pulmonology) office will reach out to you to get rescheduled.      Time Spent with Patient: I have spent a total of 25 minutes with patient reviewing hospital notes, telemetry, EKGs, labs and examining the patient as well as establishing an assessment and plan that was discussed with the patient.  > 50% of time was spent in direct patient care.  Signed, Addison Naegeli. Audie Box, MD, [place]  [address], [place], [place] [address] [phone]  04/21/2021 11:34 AM

## 2021-04-21 ENCOUNTER — Ambulatory Visit (INDEPENDENT_AMBULATORY_CARE_PROVIDER_SITE_OTHER): Payer: 59 | Admitting: Cardiovascular Disease

## 2021-04-21 ENCOUNTER — Other Ambulatory Visit: Payer: Self-pay

## 2021-04-21 ENCOUNTER — Encounter: Payer: Self-pay | Admitting: Cardiovascular Disease

## 2021-04-21 ENCOUNTER — Ambulatory Visit: Payer: 59 | Admitting: Cardiovascular Disease

## 2021-04-21 VITALS — BP 118/58 | HR 70 | Ht 68.0 in | Wt 119.4 lb

## 2021-04-21 DIAGNOSIS — J90 Pleural effusion, not elsewhere classified: Secondary | ICD-10-CM

## 2021-04-21 DIAGNOSIS — I25810 Atherosclerosis of coronary artery bypass graft(s) without angina pectoris: Secondary | ICD-10-CM | POA: Diagnosis not present

## 2021-04-21 DIAGNOSIS — R0602 Shortness of breath: Secondary | ICD-10-CM | POA: Diagnosis not present

## 2021-04-21 DIAGNOSIS — E782 Mixed hyperlipidemia: Secondary | ICD-10-CM | POA: Diagnosis not present

## 2021-04-21 DIAGNOSIS — M79604 Pain in right leg: Secondary | ICD-10-CM

## 2021-04-21 DIAGNOSIS — M79605 Pain in left leg: Secondary | ICD-10-CM

## 2021-04-21 NOTE — Patient Instructions (Addendum)
Medication Instructions:  Stop Lisinopril Stop Torsemide   *If you need a refill on your cardiac medications before your next appointment, please call your pharmacy*   Testing/Procedures: Your physician has requested that you have a lower extremity arterial duplex. This test is an ultrasound of the arteries in the legs. It looks at arterial blood flow in the legs. Allow one hour for Lower Arterial scans. There are no restrictions or special instructions   Follow-Up: At [facility], you and your health needs are our priority.  As part of our continuing mission to provide you with exceptional heart care, we have created designated Provider Care Teams.  These Care Teams include your primary Cardiologist (physician) and Advanced Practice Providers (APPs -  Physician Assistants and Nurse Practitioners) who all work together to provide you with the care you need, when you need it.  We recommend signing up for the patient portal called "MyChart".  Sign up information is provided on this After Visit Summary.  MyChart is used to connect with patients for Virtual Visits (Telemedicine).  Patients are able to view lab/test results, encounter notes, upcoming appointments, etc.  Non-urgent messages can be sent to your provider as well.   To learn more about what you can do with MyChart, go to [email or web address].    Your next appointment:   6 month(s)  The format for your next appointment:   In Person  Provider:   Eleonore Chiquito, MD   Dr.Ellisons (pulmonology) office will reach out to you to get rescheduled.

## 2021-05-06 ENCOUNTER — Telehealth: Payer: Self-pay

## 2021-05-06 NOTE — Telephone Encounter (Signed)
Patient calls nurse line regarding elevated fasting blood sugar levels. Patient reports that his fasting blood sugar this AM was 345. Patient is asymptomatic.   Patient is asking if he should increase his jardiance. Please advise.   Patient has follow up appointment on 6/23. ED precautions given.   Talbot Grumbling, RN

## 2021-05-07 NOTE — Telephone Encounter (Signed)
Called and spoke with patient.  He cannot come until 6/23 due to transportation and would like to see pharmacist and PCP at the same time.  Discussed that he is on max dose of current DM meds and would like to start him on injectable, but he will need training for this.  Also advised that in the meantime he should watch his diet and check CBGs and if remain consistently elevated can call, but if starts feeling unwell, needs to go to ED.  He voiced understanding.   Rachelle, I scheduled him with you on 6/23 at 9AM.  He has an appointment with me right before this.  Hopefully we can use this time to get him started on a GLP-1.  Thanks for your help.  Sorry if there was any confusion!

## 2021-05-07 NOTE — Telephone Encounter (Signed)
Patient returns call to nurse line. Patient states that he cannot come in until scheduled appointment on 6/23 due to issues with transportation.   Talbot Grumbling, RN

## 2021-05-07 NOTE — Telephone Encounter (Signed)
Attempted to call patient, wife answered and stated that husband was not home.  She states that she will have him call back.  Would like for him to come in this afternoon for an appointment with pharmacy for GLP-1 teaching and new prescription (for his diabetes).  If he calls back and is able to come in this afternoon, please schedule him with [facility] for GLP-1 initial prescription.      Ernst Bowler, his is maxed out on jardiance and metformin.  I'd like to start him on GLP-1, but he will need some teaching.  I'm hoping to get him in to see you this afternoon since I saw that you have availability.  If he can't come in today, we can try for Monday as well.  We can start with a sample if you have one and then you can let me know if he comes and I can send an Rx as well for whatever we had as a sample.  I gave your pharmacy resident and student a heads up about him this morning.  Let me know if you have any questions! Thanks!

## 2021-05-11 ENCOUNTER — Other Ambulatory Visit (HOSPITAL_COMMUNITY): Payer: Self-pay | Admitting: Cardiovascular Disease

## 2021-05-11 DIAGNOSIS — I739 Peripheral vascular disease, unspecified: Secondary | ICD-10-CM

## 2021-05-13 ENCOUNTER — Encounter: Payer: Self-pay | Admitting: Family Medicine

## 2021-05-13 ENCOUNTER — Ambulatory Visit: Payer: 59 | Admitting: Family Medicine

## 2021-05-13 ENCOUNTER — Ambulatory Visit (INDEPENDENT_AMBULATORY_CARE_PROVIDER_SITE_OTHER): Payer: 59 | Admitting: Family Medicine

## 2021-05-13 ENCOUNTER — Ambulatory Visit (INDEPENDENT_AMBULATORY_CARE_PROVIDER_SITE_OTHER): Payer: 59 | Admitting: Pharmacist

## 2021-05-13 ENCOUNTER — Other Ambulatory Visit: Payer: Self-pay

## 2021-05-13 VITALS — BP 121/56 | HR 68 | Ht 68.0 in | Wt 114.6 lb

## 2021-05-13 DIAGNOSIS — I2510 Atherosclerotic heart disease of native coronary artery without angina pectoris: Secondary | ICD-10-CM | POA: Diagnosis not present

## 2021-05-13 DIAGNOSIS — E11311 Type 2 diabetes mellitus with unspecified diabetic retinopathy with macular edema: Secondary | ICD-10-CM | POA: Diagnosis not present

## 2021-05-13 DIAGNOSIS — I739 Peripheral vascular disease, unspecified: Secondary | ICD-10-CM

## 2021-05-13 DIAGNOSIS — Z794 Long term (current) use of insulin: Secondary | ICD-10-CM

## 2021-05-13 DIAGNOSIS — J90 Pleural effusion, not elsewhere classified: Secondary | ICD-10-CM | POA: Diagnosis not present

## 2021-05-13 LAB — POCT GLYCOSYLATED HEMOGLOBIN (HGB A1C): HbA1c, POC (controlled diabetic range): 11 % — AB (ref 0.0–7.0)

## 2021-05-13 MED ORDER — TRULICITY 0.75 MG/0.5ML ~~LOC~~ SOAJ: 0.7500 mg | SUBCUTANEOUS | 0 refills | Status: DC

## 2021-05-13 NOTE — Patient Instructions (Signed)
Thank you for coming to see me today. It was a pleasure. Today we talked about:   You will see the pharmacist to start a new diabetes medication today.  Please follow-up with new PCP in 1 month.  If you have any questions or concerns, please do not hesitate to call the office at [phone].  Best,   Arizona Constable, DO

## 2021-05-13 NOTE — Patient Instructions (Addendum)
It was great meeting you today!  We are starting you on Trulicity (dulaglutide) 0.75 mg injection into the stomach a few inches away from the belly button ONCE weekly every Thursday. Monitor for upset stomach and nausea. If you vomit, please stop the medication and call the clinic.   Please bring your glucose monitor and blood sugar readings with you to your next appointment. We will follow up with you in clinic in 1 month.   My phone number is [phone]

## 2021-05-13 NOTE — Assessment & Plan Note (Signed)
Not controlled, A1c increased from 8.2-11.  Did discuss with patient monitoring his CBGs and making sure that he is adjusting his diet to avoid things that increase her sugars.  Continue with Jardiance and metformin.  Obtain a BMP given increase in Jardiance.  He has an appointment following this appointment with pharmacy, we plan to start him on a GLP-1.  He will plan to follow with [facility] in the meantime while he is being established with his new PCP.  Appointment made with new PCP Dr. Posey Pronto on 8/4.  ACE is on hold per cardiology on 6/1.

## 2021-05-13 NOTE — Assessment & Plan Note (Signed)
Present on plain films that were brought with patient from [place] following his CABG.  Per cardiology, does not seem to be related to the heart.  He has an appointment with pulmonology on 6/28.  His breathing is currently stable.

## 2021-05-13 NOTE — Progress Notes (Signed)
   Subjective:    Patient ID: Alec York, male    DOB: February 21, 1951, 70 y.o.   MRN: [id number]  HPI Patient is a 70 y.o. male who presents for diabetes management. He is in good spirits and presents with assistance of cane with wife. Patient was referred on 05/07/21 and last seen by Primary Care Provider today before visit.  See office note from PCP today for diabetes intake. Provider requests discussion of GLP1 with patient due to increase in A1C today from 8.2 to 11%. Patient previously on insulin therapy but requests to hold off at this time.   Insurance coverage/medication affordability: Medicare  Current diabetes medications include: Metformin 1000mg  BID, jardiance 25mg  Patient states that He is taking his diabetes medications as prescribed. Patient reports adherence with medications.   Objective:   Labs:   Lab Results  Component Value Date   HGBA1C 11.0 (A) 05/13/2021   HGBA1C 8.2 (A) 01/28/2021   HGBA1C 10.3 (A) 01/10/2020    Lab Results  Component Value Date   MICRALBCREAT 34 (H) 09/25/2019    Lipid Panel     Component Value Date/Time   CHOL 104 01/28/2021 1449   TRIG 101 01/28/2021 1449   HDL 36 (L) 01/28/2021 1449   CHOLHDL 2.9 01/28/2021 1449   CHOLHDL 2.9 05/31/2016 0843   VLDL 32 (H) 05/31/2016 0843   LDLCALC 49 01/28/2021 1449    Clinical Atherosclerotic Cardiovascular Disease (ASCVD): Yes The ASCVD Risk score Mikey Bussing DC Jr., et al., 2013) failed to calculate for the following reasons:   The valid total cholesterol range is 130 to 320 mg/dL   Assessment/Plan:   T2DM is not controlled. Medication adherence appears optimal. Additional pharmacotherapy is warranted. Will initiate Trulicity 0.75mg  once weekly. Patient educated on purpose, proper use and potential adverse effects of Trulicity.  Following instruction patient verbalized understanding of treatment plan. Medication Samples have been provided to the patient.  Drug name: Dulagltuide (Trulicity)        Strength: 0.75mg         Qty: 2 pens  LOT: [id number]  Exp.Date: 12/24/2022  Dosing instructions: Inject 0.75mg  into the skin once weekly every Thursday.  Patient properly self-administered first dose of Trulicity in office under supervision.   [facility] 10:04 AM 05/13/2021   Started GLP-1 dulaglutide (Trulicity) 0.75mg  once weekly.  Continued SGLT2-I empagliflozin (Jardiance) 25mg . Counseled on s/sx of and management of hypoglycemia Next A1C anticipated September 2022.   ASCVD risk - secondary prevention in patient with diabetes. Last LDL is controlled.   Continued aspirin 81 mg  Continued rosuvastatin 10 mg.   Follow-up appointment in four weeks to review sugar readings and tolerability to Trulicity. Written patient instructions provided.  This appointment required 30 minutes of patient care (this includes precharting, chart review, review of results, and face-to-face care).  Thank you for involving pharmacy to assist in providing this patient's care.  Patient seen with Romilda Garret, PharmD - PGY-1 Resident.

## 2021-05-13 NOTE — Assessment & Plan Note (Signed)
Stable, continue current regimen.  Following with cardiology, next appointment in December.

## 2021-05-13 NOTE — Progress Notes (Signed)
    SUBJECTIVE:   CHIEF COMPLAINT / HPI:   T2DM Current regimen: Jardiance 25 mg (increased on 5/20), metformin 1000 mg twice daily CBG more than 250, has been eating well  Last A1c 8.2 on 3/10 Follows with ophthalmology On statin, last LDL 49 in March Not currently on ACE, lisinopril and torsemide were held by cardiology on 6/1 due to low BP  Recurrent right pleural effusion Following with cardiology, last seen on 6/1 He has been referred to pulmonology and is scheduled for 6/28  CAD s/p CABG Per cardiology note on 6/1, plan to continue with aspirin and Plavix and then discontinue Plavix after 1 year of therapy Currently on a beta-blocker Lisinopril and torsemide were held on 6/1 due to low BPs Currently on statin, LDL is at goal Plans to see cardiology again in December No chest pain or worsening or breathing, no leg edema  PAD ABIs performed at house  Right leg 0.45, left leg 0.65 On statin per above Scheduled for arterial duplex 7/6  PERTINENT  PMH / PSH:  HTN, PAD, HFpEF, CAD s/p CABG, T2DM, HLD, diabetic neuropathy, CLL  OBJECTIVE:   BP (!) 121/56   Pulse 68   Ht 5\' 8"  (1.727 m)   Wt 114 lb 9.6 oz (52 kg)   SpO2 100%   BMI 17.42 kg/m    Physical Exam:  General: 70 y.o. male in NAD Cardio: RRR  Lungs: slightly diminished breath sounds on right at base, no IWOB on RA Abdomen: Soft, non-tender to palpation, non-distended, positive bowel sounds Skin: warm and dry Extremities: No edema  Results for orders placed or performed in visit on 05/13/21 (from the past 24 hour(s))  POCT glycosylated hemoglobin (Hb A1C)     Status: Abnormal   Collection Time: 05/13/21  8:39 AM  Result Value Ref Range   Hemoglobin A1C     HbA1c POC (<> result, manual entry)     HbA1c, POC (prediabetic range)     HbA1c, POC (controlled diabetic range) 11.0 (A) 0.0 - 7.0 %     ASSESSMENT/PLAN:   Type 2 diabetes mellitus with diabetic retinopathy ([place]) Not controlled, A1c  increased from 8.2-11.  Did discuss with patient monitoring his CBGs and making sure that he is adjusting his diet to avoid things that increase her sugars.  Continue with Jardiance and metformin.  Obtain a BMP given increase in Jardiance.  He has an appointment following this appointment with pharmacy, we plan to start him on a GLP-1.  He will plan to follow with [facility] in the meantime while he is being established with his new PCP.  Appointment made with new PCP Dr. Posey Pronto on 8/4.  ACE is on hold per cardiology on 6/1.  Recurrent right pleural effusion Present on plain films that were brought with patient from [place] following his CABG.  Per cardiology, does not seem to be related to the heart.  He has an appointment with pulmonology on 6/28.  His breathing is currently stable.  Coronary artery disease involving native heart without angina pectoris Stable, continue current regimen.  Following with cardiology, next appointment in December.  PAD (peripheral artery disease) (HCC) On statin, LDL at goal.  Having arterial duplex performed in July.  Follow-up results.     Cleophas Dunker, [place]

## 2021-05-13 NOTE — Assessment & Plan Note (Signed)
>>  ASSESSMENT AND PLAN FOR CORONARY ARTERY DISEASE INVOLVING NATIVE HEART WITHOUT ANGINA PECTORIS WRITTEN ON 05/13/2021  9:13 AM BY MECCARIELLO, BAILEY J, DO  Stable, continue current regimen.  Following with cardiology, next appointment in December.

## 2021-05-13 NOTE — Assessment & Plan Note (Signed)
On statin, LDL at goal.  Having arterial duplex performed in July.  Follow-up results.

## 2021-05-14 ENCOUNTER — Telehealth: Payer: Self-pay | Admitting: Pharmacist

## 2021-05-14 ENCOUNTER — Encounter: Payer: Self-pay | Admitting: Family Medicine

## 2021-05-14 ENCOUNTER — Telehealth: Payer: Self-pay | Admitting: Hematology

## 2021-05-14 LAB — BASIC METABOLIC PANEL
BUN/Creatinine Ratio: 16 (ref 10–24)
BUN: 15 mg/dL (ref 8–27)
CO2: 24 mmol/L (ref 20–29)
Calcium: 9.8 mg/dL (ref 8.6–10.2)
Chloride: 96 mmol/L (ref 96–106)
Creatinine, Ser: 0.92 mg/dL (ref 0.76–1.27)
Glucose: 256 mg/dL — ABNORMAL HIGH (ref 65–99)
Potassium: 4.8 mmol/L (ref 3.5–5.2)
Sodium: 136 mmol/L (ref 134–144)
eGFR: 91 mL/min/{1.73_m2} (ref >59)

## 2021-05-14 NOTE — Telephone Encounter (Signed)
Patient called and left a voicemail on my direct line requesting a call back. Attempted to call patient back 4 times with phone being picked up but nobody answering on the other line.

## 2021-05-14 NOTE — Telephone Encounter (Signed)
I was able to get in touch with patient and he was worried because his blood glucose today at 1PM was 256. He was wondering why it has not decreased yet due to starting the Trulicity. Discussed with patient that this medication takes time to work to see its full effectiveness. Patient has appt on 06/10/21 but discussed ability to see patient sooner if he is still concerned about his blood glucose. Patient also has concerns that he is losing weight and wasn't sure if it was because of his cancer. He states he sees the oncologist next week. I mentioned he can discuss with them as this is a plausible cause for weight loss but assured him it was not the Trulicity as we just initiated it yesterday. Patient states he will call next week if his blood glucose continues to remain elevated.

## 2021-05-14 NOTE — Telephone Encounter (Signed)
Pt called in requesting an earlier date for his appt per 6/24 sch msg. Moved appt per pt request. Pt aware.

## 2021-05-15 NOTE — Telephone Encounter (Signed)
FMTS ATTENDING NOTE Lonna Rabold,MD I  have reviewed their chart. I agree with the resident's findings, assessment and care plan.

## 2021-05-18 ENCOUNTER — Encounter: Payer: Self-pay | Admitting: Pulmonary Disease

## 2021-05-18 ENCOUNTER — Telehealth: Payer: Self-pay | Admitting: Hematology

## 2021-05-18 ENCOUNTER — Other Ambulatory Visit: Payer: Self-pay

## 2021-05-18 ENCOUNTER — Ambulatory Visit (INDEPENDENT_AMBULATORY_CARE_PROVIDER_SITE_OTHER): Payer: 59 | Admitting: Pulmonary Disease

## 2021-05-18 VITALS — BP 100/50 | HR 75 | Ht 68.0 in | Wt 118.0 lb

## 2021-05-18 DIAGNOSIS — J90 Pleural effusion, not elsewhere classified: Secondary | ICD-10-CM | POA: Diagnosis not present

## 2021-05-18 NOTE — Telephone Encounter (Signed)
Left message with rescheduled upcoming appointment due to provider not in office. 

## 2021-05-18 NOTE — Progress Notes (Signed)
Subjective:   PATIENT ID: Alec York GENDER: male DOB: 10/29/1951, MRN: [id number]   HPI  Chief Complaint  Patient presents with   Consult    History of Pleural effusion    Reason for Visit:New consult for pleural effusion  [name] is a 70 year old male with history of non-Hodgkin's lymphoma in 2019, CAD s/p CABG, HTN, HLD who presents as new consult for pleural effusion.  He recently returned from [place] this year. While away he had an MI and underwent CABG in August 2021. Returned in February 2022 and was noted to have weight loss on his Oncology visit which he attributed to his recent cardiac events and loss of his son to COVID-19. He reported that while in [place] he had right lower lobe fluid. PET/CT 02/02/21 with unchanged mediastinal lymphadenopathy with similar hypermetabolic activity. Interval development of moderate right and small left pleural effusion. He has been seen by his Cardiologist who has optimized his diuresis and fluid remains present. He was referred to Pulmonary for further management. He reports shortness of breath with exertion. His activity is limited by his leg pain and shortness of breath. Denies chest or pleuritic pain. No cough, sputum production, hemoptysis, wheezing, fevers or chills.  I have personally reviewed patient's past medical/family/social history, allergies, current medications.  Past Medical History:  Diagnosis Date   Allergy    Coronary artery disease    Diabetes mellitus without complication (HCC)    Diabetic retinopathy ([place])    Disabled 2/2 retinopathy   GERD (gastroesophageal reflux disease)    Hyperlipidemia    Hypertension      Family History  Problem Relation Age of Onset   Heart disease Mother    Diabetes Neg Hx      Social History   Occupational History   Occupation: Retired  Tobacco Use   Smoking status: Former    Packs/day: 0.50    Years: 10.00    Pack years: 5.00    Types: Cigarettes    Start  date: 1971    Quit date: 2010    Years since quitting: 12.4   Smokeless tobacco: Never  Vaping Use   Vaping Use: Never used  Substance and Sexual Activity   Alcohol use: No    Alcohol/week: 0.0 standard drinks   Drug use: No   Sexual activity: Yes    Birth control/protection: None    No Known Allergies   Outpatient Medications Prior to Visit  Medication Sig Dispense Refill   Accu-Chek FastClix Lancets MISC Use to test sugars up to 4 times daily.  Dx Code: E11.319 102 each 12   aspirin EC 81 MG tablet Take 1 tablet (81 mg total) by mouth daily. Swallow whole. 90 tablet 3   blood glucose meter kit and supplies KIT Dispense based on patient and insurance preference. Use up to four times daily as directed. Dx E11.319 1 each 0   Blood Glucose Monitoring Suppl (ACCU-CHEK GUIDE) w/Device KIT 1 each by Does not apply route 4 (four) times daily as needed. Use to test sugars up to 4 times daily.  Dx Code: E11.319 1 kit 0   Blood Pressure Monitoring (BLOOD PRESSURE CUFF) MISC 1 Units by Does not apply route daily. 1 each 0   clopidogrel (PLAVIX) 75 MG tablet Take 1 tablet (75 mg total) by mouth daily. 90 tablet 3   Dulaglutide (TRULICITY) 2.95 MW/4.1LK SOPN Inject 0.75 mg into the skin once a week. 2 mL 0  empagliflozin (JARDIANCE) 25 MG TABS tablet Take 1 tablet (25 mg total) by mouth daily. 90 tablet 1   glucose blood (ACCU-CHEK GUIDE) test strip Use to test sugars up to 4 times daily.  Dx Code: E11.319 200 each 12   iron polysaccharides (NIFEREX) 150 MG capsule Take 1 capsule (150 mg total) by mouth daily. 30 capsule 5   Lancet Devices (ACCU-CHEK SOFTCLIX) lancets Use as instructed up to 4 times daily.  Dx E11.319 400 each 3   Lancets Misc. (ACCU-CHEK FASTCLIX LANCET) KIT Use to test sugars up to 4 times daily.  Dx Code: E11.319 1 kit 0   metFORMIN (GLUCOPHAGE) 1000 MG tablet Take 1 tablet (1,000 mg total) by mouth 2 (two) times daily with a meal. 180 tablet 1   metoprolol succinate  (TOPROL-XL) 50 MG 24 hr tablet Take 1 tablet (50 mg total) by mouth at bedtime. Take with or immediately following a meal. 90 tablet 3   nitroGLYCERIN (NITROSTAT) 0.4 MG SL tablet Place 1 tablet (0.4 mg total) under the tongue every 5 (five) minutes as needed for chest pain. 30 tablet 12   PRESCRIPTION MEDICATION Take 1 capsule by mouth daily at 2 PM. Rosuvastatin,ASA,Clopidogrel -- prescribed in [place].     RELION PEN NEEDLES 31G X 6 MM MISC USE AS DIRECTED TO  CHECK  BLOOD  SUGAR 50 each 23   rosuvastatin (CRESTOR) 10 MG tablet Take 1 tablet (10 mg total) by mouth daily. 90 tablet 3   No facility-administered medications prior to visit.    ROS   Objective:   Vitals:   05/18/21 1149  BP: (!) 100/50  Pulse: 75  SpO2: 100%  Weight: 118 lb (53.5 kg)  Height: 5' 8"  (1.727 m)   SpO2: 100 %  Physical Exam: General: Thin-appearing (compared to EMR photo), no acute distress HENT: [place], AT Eyes: EOMI, no scleral icterus Respiratory: Clear to auscultation left breath sounds. Diminished right lower lobe air entry.  No crackles, wheezing or rales Cardiovascular: RRR, -M/R/G, no JVD Extremities:-Edema,-tenderness Neuro: AAO x4, CNII-XII grossly intact Skin: S/p midsternal scar, Intact, no rashes or bruising Psych: Normal mood, normal affect  Data Reviewed:  Imaging: PET/CT 06/15/21 - Bilateral hypermetabolic lymph nodules in the neck, axilla and mediastinum. Parenchyma normal, no effusion or infiltrate PET/CT 02/02/21 - unchanged neck, axilla and mediastinal lymphadenopathy and metabolic activity. Interval development of bilateral pleural effusion R >L Ultrasound of right thorax 05/18/21  Small/moderate right pleural effusion visualized. No effusion seen on left thorax  PFT: None  Labs: CBC    Component Value Date/Time   WBC 20.0 (H) 01/12/2021 0823   WBC 26.6 (H) 02/24/2020 0916   RBC 4.35 01/12/2021 0823   HGB 10.9 (L) 01/12/2021 0823   HGB 12.3 (L) 03/22/2018 0941   HCT 34.6  (L) 01/12/2021 0823   HCT 38.9 03/22/2018 0941   PLT 216 01/12/2021 0823   PLT 235 03/22/2018 0941   MCV 79.5 (L) 01/12/2021 0823   MCV 85 03/22/2018 0941   MCH 25.1 (L) 01/12/2021 0823   MCHC 31.5 01/12/2021 0823   RDW 16.4 (H) 01/12/2021 0823   RDW 13.5 03/22/2018 0941   LYMPHSABS 10.8 (H) 01/12/2021 0823   LYMPHSABS 6.3 (H) 03/22/2018 0941   MONOABS 1.2 (H) 01/12/2021 0823   EOSABS 0.4 01/12/2021 0823   EOSABS 0.2 03/22/2018 0941   BASOSABS 0.1 01/12/2021 0823   BASOSABS 0.0 03/22/2018 0941   BMET    Component Value Date/Time   NA 136 05/13/2021 0949  K 4.8 05/13/2021 0949   CL 96 05/13/2021 0949   CO2 24 05/13/2021 0949   GLUCOSE 256 (H) 05/13/2021 0949   GLUCOSE 227 (H) 01/12/2021 0823   BUN 15 05/13/2021 0949   CREATININE 0.92 05/13/2021 0949   CREATININE 1.08 01/12/2021 0823   CREATININE 1.21 12/22/2016 0939   CALCIUM 9.8 05/13/2021 0949   GFRNONAA >60 01/12/2021 0823   GFRNONAA 63 12/22/2016 0939   GFRAA >60 02/24/2020 0916   GFRAA 73 12/22/2016 0939   Elevated/unchanged WBC since 2021, mild anemia. Normal renal function and electrolytes. Elevated glucose  Imaging, labs and test noted above have been reviewed independently by me.    Assessment & Plan:   Discussion:  Small/Moderate right pleural effusion Reportedly present since 06/2020. Low suspicion for infection given chronicity. Despite diuresis right pleural effusion remains. Reasonable to perform diagnostic and therapeutic thoracentesis since pleural effusion has not yet been evaluated with plan to rule out malignant effusion in setting of recent weight loss and history of lymphoma. I have discussed anticoagulation with his Cardiologist, Dr. Audie Box. Patient was planned to discontinue Plavix in August. Ok per Cardiology to stop Plavix prior to procedure and no need to restart.  Will arrange for in-clinic thoracentesis on 06/02/21 at 10 AM. I have called patient and advised to stop Plavix on 05/26/21. Patient  expressed understanding.  Health Maintenance Immunization History  Administered Date(s) Administered   Influenza, High Dose Seasonal PF 08/05/2018, 08/24/2019   Influenza,inj,Quad PF,6+ Mos 08/19/2015, 12/22/2016   Influenza-Unspecified 08/24/2019   PFIZER(Purple Top)SARS-COV-2 Vaccination 01/19/2020, 02/12/2020   Pneumococcal Conjugate-13 08/10/2018   Pneumococcal Polysaccharide-23 12/22/2016   Tdap 02/17/2017   CT Lung Screen - not indicated.   No orders of the defined types were placed in this encounter. No orders of the defined types were placed in this encounter.   Return in about 15 days (around 06/02/2021).  I have spent a total time of 45-minutes on the day of the appointment reviewing prior documentation, coordinating care and discussing medical diagnosis and plan with the patient/family. Imaging, labs and tests included in this note have been reviewed and interpreted independently by me.  [place], MD [facility] 05/18/2021 12:08 PM  Office Number [phone]

## 2021-05-18 NOTE — Patient Instructions (Signed)
Will arrange for in-clinic thoracentesis on 06/02/21 at 10 AM. I will contact your Cardiologist for holding Plavix.

## 2021-05-19 ENCOUNTER — Telehealth: Payer: Self-pay | Admitting: Pulmonary Disease

## 2021-05-19 ENCOUNTER — Inpatient Hospital Stay: Payer: 59 | Admitting: Hematology

## 2021-05-19 ENCOUNTER — Encounter: Payer: Self-pay | Admitting: Pulmonary Disease

## 2021-05-19 ENCOUNTER — Other Ambulatory Visit: Payer: Self-pay

## 2021-05-19 ENCOUNTER — Inpatient Hospital Stay: Payer: 59

## 2021-05-19 NOTE — Telephone Encounter (Signed)
Patient is calling about medications he was suppose to start 05/26/21. for fluid in lung - he asked can a letter be sent so he knows which medication he needs to be taking Patients number is [phone].   Dr. Loanne Drilling I don't seen anything that says the patient was starting new meds just that he was going to stop plavix. Wanted to confirm with you before calling the patient. Please advise

## 2021-05-20 NOTE — Telephone Encounter (Signed)
Called and spoke with patient. He verbalized understanding. ? ?Nothing further needed at time of call.  ?

## 2021-05-26 ENCOUNTER — Ambulatory Visit (HOSPITAL_COMMUNITY)
Admission: RE | Admit: 2021-05-26 | Payer: 59 | Source: Ambulatory Visit | Attending: Cardiovascular Disease | Admitting: Cardiovascular Disease

## 2021-05-31 NOTE — Progress Notes (Signed)
HEMATOLOGY/ONCOLOGY CLNIC NOTE  Date of Service: 06/01/2021  Patient Care Team: Lattie Haw, MD as PCP - General (Family Medicine)  Melissa Montane, MD as ENT  CHIEF COMPLAINTS/PURPOSE OF CONSULTATION:  Non-Hodgkin's Lymphoma   HISTORY OF PRESENTING ILLNESS:   Alec York is a wonderful 70 y.o. male who has been referred to Alec York by ENT Dr. Melissa Montane for evaluation and management of Non-Hodgkin's Lymphoma. He is accompanied today by his wife. The pt reports that he is doing well overall.   The pt had a Fine needle aspiration of the left neck mass on 05/16/18 which confirmed a B-cell Non-Hodgkin's lymphoma.   The pt reports well controlled DM and HTN. He denies neuropathy in his hands but this sometimes occurs in his legs. He also endorses vision changes related to his DM and he takes Lantus and Metformin. He denies heart or kidney problems, or previous surgeries. He notes that his PCP Dr. Wendee Beavers manages his DM.   He first noticed the right neck mass about 3 months ago and describes that the appearance was sudden. He denies any other lumps or bumps, fevers, chills, night sweats, unexpected weight loss, and change in appetite.   Of note prior to the patient's visit today, pt has had Alec York Soft Tissue Head/Neck completed on 03/23/18 with results revealing Multiple well-circumscribed neck lesions are identified, consistent with lymph nodes, the largest of which measures 2.1 x 1.5 x 2.3 cm. Benign behavior is not established. Bulky adenopathy such as this could relate to lymphoma, or metastatic squamous cell carcinoma. There is no visible extranodal spread of tumor.   Most recent lab results (03/22/18) of CBC w/diff is as follows: all values are WNL except for WBC at 12.8k, HGB at 12.3, Lymphs abs at 6.3k.  On review of systems, pt reports left neck mass, eating well, and denies fevers, chills, night sweats, unexpected weight loss, noticing any other lumps or bumps, pain along the spine,  abdominal pains, leg swelling, testicular pain or swelling, skin rashes, and any other symptoms.   On Social Hx the pt reports that he quit smoking cigarettes 10 years ago, and had been smoking 5-6 cigarettes each day. He denies drinking any ETOH.  On Family Hx the pt denies cancer.   INTERVAL HISTORY:   Alec York returns today for management and evaluation of his Non-Hodgkin's Lymphoma. The patient's last visit with Alec York was on 01/12/2021. The pt reports that he is doing well overall. Dr. Irene Limbo is acting as the interpretor today.  The pt reports that he has been very anxious about how he has been doing lately. He is still dealing with the stress of losing his son last year. He is continuing to lose weight and has lost around 14 pounds in 4 months. He is seeing a Pulmonologist for the right pleural effusion and recently got a Alec York for this last month. The pt also notes some imbalance and dizziness. He has been dealing with numbness in his toes, but not his hands. The patient notes all of this has caused him to be very anxious of lymphoma progression.   Of note since the patient's last visit, pt has had PET Skull Base to Thigh on 02/02/2021, which revealed "Lower cervical and thoracic lymphadenopathy, as described above, without appreciable/significant change. Moderate right and small left pleural effusions, likely chronic, although new from 2019. Associated lower lobe atelectasis. Spleen is normal in size. No findings suspicious for lymphoma below the diaphragm."  Lab results  today 06/01/2021 of CBC w/diff and CMP is as follows: all values are WNL except for WBC of 22.4K, Hgb of 12.0, HCT of 37.7, MCH of 25.9, Lymphs Abs of 14.0K, Monocytes Abs of 1.2K, Glucose of 159, Total protein of 8.2, AST of 11. 06/01/2021 LDH of 134.  On review of systems, pt reports numbness in toes b/l, imbalance, weight loss , intermittent dizziness, anxiety and denies numbness in hands, abdominal pain, abdominal  distention, nausea, diarrhea, depression, stress, new lumps/bumps, and any other symptoms.  MEDICAL HISTORY:  Past Medical History:  Diagnosis Date   Allergy    Coronary artery disease    Diabetes mellitus without complication (HCC)    Diabetic retinopathy ([place])    Disabled 2/2 retinopathy   GERD (gastroesophageal reflux disease)    Hyperlipidemia    Hypertension     SURGICAL HISTORY: Past Surgical History:  Procedure Laterality Date   CARDIAC CATHETERIZATION     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   CABG x 4 [place]    SOCIAL HISTORY: Social History   Socioeconomic History   Marital status: Married    Spouse name: Not on file   Number of children: 2   Years of education: Not on file   Highest education level: Not on file  Occupational History   Occupation: Retired  Tobacco Use   Smoking status: Former    Packs/day: 0.50    Years: 10.00    Pack years: 5.00    Types: Cigarettes    Start date: 1971    Quit date: 2010    Years since quitting: 12.5   Smokeless tobacco: Never  Vaping Use   Vaping Use: Never used  Substance and Sexual Activity   Alcohol use: No    Alcohol/week: 0.0 standard drinks   Drug use: No   Sexual activity: Yes    Birth control/protection: None  Other Topics Concern   Not on file  Social History Narrative   Not on file   Social Determinants of Health   Financial Resource Strain: Not on file  Food Insecurity: Not on file  Transportation Needs: Not on file  Physical Activity: Not on file  Stress: Not on file  Social Connections: Not on file  Intimate Partner Violence: Not on file    FAMILY HISTORY: Family History  Problem Relation Age of Onset   Heart disease Mother    Diabetes Neg Hx     ALLERGIES:  has No Known Allergies.  MEDICATIONS:  Current Outpatient Medications  Medication Sig Dispense Refill   Accu-Chek FastClix Lancets MISC Use to test sugars up to 4 times daily.  Dx Code: E11.319 102 each 12   aspirin EC 81  MG tablet Take 1 tablet (81 mg total) by mouth daily. Swallow whole. 90 tablet 3   blood glucose meter kit and supplies KIT Dispense based on patient and insurance preference. Use up to four times daily as directed. Dx E11.319 1 each 0   Blood Glucose Monitoring Suppl (ACCU-CHEK GUIDE) w/Device KIT 1 each by Does not apply route 4 (four) times daily as needed. Use to test sugars up to 4 times daily.  Dx Code: E11.319 1 kit 0   Blood Pressure Monitoring (BLOOD PRESSURE CUFF) MISC 1 Units by Does not apply route daily. 1 each 0   clopidogrel (PLAVIX) 75 MG tablet Take 1 tablet (75 mg total) by mouth daily. 90 tablet 3   Dulaglutide (TRULICITY) 2.01 EO/7.1QR SOPN Inject 0.75 mg into the skin once  a week. 2 mL 0   empagliflozin (JARDIANCE) 25 MG TABS tablet Take 1 tablet (25 mg total) by mouth daily. 90 tablet 1   glucose blood (ACCU-CHEK GUIDE) test strip Use to test sugars up to 4 times daily.  Dx Code: E11.319 200 each 12   iron polysaccharides (NIFEREX) 150 MG capsule Take 1 capsule (150 mg total) by mouth daily. 30 capsule 5   Lancet Devices (ACCU-CHEK SOFTCLIX) lancets Use as instructed up to 4 times daily.  Dx E11.319 400 each 3   Lancets Misc. (ACCU-CHEK FASTCLIX LANCET) KIT Use to test sugars up to 4 times daily.  Dx Code: E11.319 1 kit 0   metFORMIN (GLUCOPHAGE) 1000 MG tablet Take 1 tablet (1,000 mg total) by mouth 2 (two) times daily with a meal. 180 tablet 1   metoprolol succinate (TOPROL-XL) 50 MG 24 hr tablet Take 1 tablet (50 mg total) by mouth at bedtime. Take with or immediately following a meal. 90 tablet 3   rosuvastatin (CRESTOR) 10 MG tablet Take 1 tablet (10 mg total) by mouth daily. 90 tablet 3   vitamin B-12 (CYANOCOBALAMIN) 500 MCG tablet Take 500 mcg by mouth daily.     nitroGLYCERIN (NITROSTAT) 0.4 MG SL tablet Place 1 tablet (0.4 mg total) under the tongue every 5 (five) minutes as needed for chest pain. 30 tablet 12   PRESCRIPTION MEDICATION Take 1 capsule by mouth daily at  2 PM. Rosuvastatin,ASA,Clopidogrel -- prescribed in [place].     RELION PEN NEEDLES 31G X 6 MM MISC USE AS DIRECTED TO  CHECK  BLOOD  SUGAR 50 each 23   No current facility-administered medications for this visit.    REVIEW OF SYSTEMS:   10 Point review of Systems was done is negative except as noted above   PHYSICAL EXAMINATION: ECOG FS:1 - Symptomatic but completely ambulatory  Vitals:   06/01/21 1153  BP: 113/60  Pulse: 65  Resp: 18  Temp: 97.9 F (36.6 C)  SpO2: 98%    Wt Readings from Last 3 Encounters:  06/01/21 117 lb 14.4 oz (53.5 kg)  05/18/21 118 lb (53.5 kg)  05/13/21 114 lb 9.6 oz (52 kg)   Body mass index is 17.93 kg/m.     GENERAL:alert, in no acute distress and comfortable SKIN: no acute rashes, no significant lesions EYES: conjunctiva are pink and non-injected, sclera anicteric OROPHARYNX: MMM, no exudates, no oropharyngeal erythema or ulceration NECK: supple, no JVD LYMPH:  no changes in the palpable lymphadenopathy in the cervical and axillary regions LUNGS: clear to auscultation b/l with normal respiratory effort HEART: regular rate & rhythm ABDOMEN:  normoactive bowel sounds , non tender, not distended. Extremity: no pedal edema PSYCH: alert & oriented x 3 with fluent speech NEURO: no focal motor/sensory deficits  LABORATORY DATA:  I have reviewed the data as listed  . CBC Latest Ref Rng & Units 06/01/2021 01/12/2021 02/24/2020  WBC 4.0 - 10.5 K/uL 22.4(H) 20.0(H) 26.6(H)  Hemoglobin 13.0 - 17.0 g/dL 12.0(L) 10.9(L) 12.2(L)  Hematocrit 39.0 - 52.0 % 37.7(L) 34.6(L) 38.5(L)  Platelets 150 - 400 K/uL 287 216 237    . CMP Latest Ref Rng & Units 06/01/2021 05/13/2021 04/09/2021  Glucose 70 - 99 mg/dL 159(H) 256(H) 283(H)  BUN 8 - 23 mg/dL _0 Creatinine 0.61 - 1.24 mg/dL 0.93 0.92 1.07  Sodium 135 - 145 mmol/L 138 136 135  Potassium 3.5 - 5.1 mmol/L 4.9 4.8 4.4  Chloride 98 - 111 mmol/L 103 96  95(L)  CO2 22 - 32 mmol/L _0 Calcium 8.9  - 10.3 mg/dL 9.8 9.8 9.9  Total Protein 6.5 - 8.1 g/dL 8.2(H) - -  Total Bilirubin 0.3 - 1.2 mg/dL 0.3 - -  Alkaline Phos 38 - 126 U/L 83 - -  AST 15 - 41 U/L 11(L) - -  ALT 0 - 44 U/L 6 - -   . Lab Results  Component Value Date   LDH 134 06/01/2021    06/18/18 Right Cervical LN Needle/core Biopsy:    05/23/18 Fine Needle Aspiration Flow Cytometry:    RADIOGRAPHIC STUDIES: I have personally reviewed the radiological images as listed and agreed with the findings in the report. No results found.  ASSESSMENT & PLAN:  70 y.o. male with  1. 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  05/16/18 Flow cytometry results which revealed NHL B-Cell lymphoma, and discussed that this is not completely diagnostic   03/23/18 Alec York Soft Tissue Head/Neck revealed Multiple well-circumscribed neck lesions are identified, consistent with lymph nodes, the largest of which measures 2.1 x 1.5 x 2.3 cm. Benign behavior is not established. Bulky adenopathy such as this could relate to lymphoma, or metastatic squamous cell carcinoma. There is no visible extranodal spread of tumor.   06/18/18 biopsy favored a low grade Marginal Zone Lymphoma 06/15/18 PET/CT revealed Enlarged and hypermetabolic lymph nodes involving the neck, axilla, subpectoral and mediastinal lymph nodes. No lymphadenopathy below the diaphragm. No findings for osseous lymphoma  06/07/18 ECHO for treatment planning revealed a normal ejection fraction  PLAN: -Discussed pt labwork today, 06/01/2021; blood counts stable. Chemistries and LDH normal. -Discussed potential of Rituxan once weekly for four weeks if the fluid removed tomorrow show signs of lymphoma. We do not need to rush to treatment due to slow growing nature of this lymphoma. -Advised pt that his blood counts are not being affected by the lymphoma and everything has been very stable. -Discussed referral to Nutritional Therapy for  helping with weight gain -Continue f/u w Pulmonologist and Cardiologist. -Will see back in 2 months with labs.   2. CAD s/p recent CABG 3. Uncontrolled DM2 4. HTN 5. PAD PLAN F/u with cardiology for optimization of CAD mx F/u with PCP for mx esp for uncontrolled DM2 - might need to consider endocrinology referral if needed.   FOLLOW UP: RTC with Dr Irene Limbo with labs in 2 months   The total time spent in the appointment was 30 minutes and more than 50% was on counseling and direct patient cares.  All of the patient's questions were answered with apparent satisfaction. The patient knows to call the clinic with any problems, questions or concerns.    Sullivan Lone MD [place] AAHIVMS [facility] Hematology/Oncology Physician [facility]  (Office):       [phone] (Work cell):  [phone] (Fax):           [phone]  06/01/2021 12:37 PM  I, Reinaldo Raddle, am acting as scribe for Dr. Sullivan Lone, MD.    .I have reviewed the above documentation for accuracy and completeness, and I agree with the above. Brunetta Genera MD

## 2021-06-01 ENCOUNTER — Inpatient Hospital Stay: Payer: 59 | Attending: Hematology

## 2021-06-01 ENCOUNTER — Inpatient Hospital Stay (HOSPITAL_BASED_OUTPATIENT_CLINIC_OR_DEPARTMENT_OTHER): Payer: 59 | Admitting: Hematology

## 2021-06-01 ENCOUNTER — Other Ambulatory Visit: Payer: Self-pay

## 2021-06-01 VITALS — BP 113/60 | HR 65 | Temp 97.9°F | Resp 18 | Wt 117.9 lb

## 2021-06-01 DIAGNOSIS — C8590 Non-Hodgkin lymphoma, unspecified, unspecified site: Secondary | ICD-10-CM | POA: Diagnosis not present

## 2021-06-01 DIAGNOSIS — E119 Type 2 diabetes mellitus without complications: Secondary | ICD-10-CM | POA: Diagnosis not present

## 2021-06-01 DIAGNOSIS — I10 Essential (primary) hypertension: Secondary | ICD-10-CM | POA: Diagnosis not present

## 2021-06-01 DIAGNOSIS — R634 Abnormal weight loss: Secondary | ICD-10-CM | POA: Diagnosis not present

## 2021-06-01 DIAGNOSIS — Z87891 Personal history of nicotine dependence: Secondary | ICD-10-CM | POA: Insufficient documentation

## 2021-06-01 DIAGNOSIS — C8591 Non-Hodgkin lymphoma, unspecified, lymph nodes of head, face, and neck: Secondary | ICD-10-CM | POA: Insufficient documentation

## 2021-06-01 LAB — CBC WITH DIFFERENTIAL/PLATELET
Abs Immature Granulocytes: 0.06 10*3/uL (ref 0.00–0.07)
Basophils Absolute: 0.1 10*3/uL (ref 0.0–0.1)
Basophils Relative: 0 %
Eosinophils Absolute: 0.4 10*3/uL (ref 0.0–0.5)
Eosinophils Relative: 2 %
HCT: 37.7 % — ABNORMAL LOW (ref 39.0–52.0)
Hemoglobin: 12 g/dL — ABNORMAL LOW (ref 13.0–17.0)
Immature Granulocytes: 0 %
Lymphocytes Relative: 63 %
Lymphs Abs: 14 10*3/uL — ABNORMAL HIGH (ref 0.7–4.0)
MCH: 25.9 pg — ABNORMAL LOW (ref 26.0–34.0)
MCHC: 31.8 g/dL (ref 30.0–36.0)
MCV: 81.3 fL (ref 80.0–100.0)
Monocytes Absolute: 1.2 10*3/uL — ABNORMAL HIGH (ref 0.1–1.0)
Monocytes Relative: 5 %
Neutro Abs: 6.6 10*3/uL (ref 1.7–7.7)
Neutrophils Relative %: 30 %
Platelets: 287 10*3/uL (ref 150–400)
RBC: 4.64 MIL/uL (ref 4.22–5.81)
RDW: 14.6 % (ref 11.5–15.5)
WBC: 22.4 10*3/uL — ABNORMAL HIGH (ref 4.0–10.5)
nRBC: 0 % (ref 0.0–0.2)

## 2021-06-01 LAB — CMP (CANCER CENTER ONLY)
ALT: 6 U/L (ref 0–44)
AST: 11 U/L — ABNORMAL LOW (ref 15–41)
Albumin: 3.7 g/dL (ref 3.5–5.0)
Alkaline Phosphatase: 83 U/L (ref 38–126)
Anion gap: 9 (ref 5–15)
BUN: 12 mg/dL (ref 8–23)
CO2: 26 mmol/L (ref 22–32)
Calcium: 9.8 mg/dL (ref 8.9–10.3)
Chloride: 103 mmol/L (ref 98–111)
Creatinine: 0.93 mg/dL (ref 0.61–1.24)
GFR, Estimated: >60 mL/min (ref >60)
Glucose, Bld: 159 mg/dL — ABNORMAL HIGH (ref 70–99)
Potassium: 4.9 mmol/L (ref 3.5–5.1)
Sodium: 138 mmol/L (ref 135–145)
Total Bilirubin: 0.3 mg/dL (ref 0.3–1.2)
Total Protein: 8.2 g/dL — ABNORMAL HIGH (ref 6.5–8.1)

## 2021-06-01 LAB — LACTATE DEHYDROGENASE: LDH: 134 U/L (ref 98–192)

## 2021-06-02 ENCOUNTER — Telehealth: Payer: Self-pay | Admitting: Hematology

## 2021-06-02 ENCOUNTER — Encounter (INDEPENDENT_AMBULATORY_CARE_PROVIDER_SITE_OTHER): Payer: 59 | Admitting: Pulmonary Disease

## 2021-06-02 NOTE — Telephone Encounter (Signed)
Scheduled follow-up appointment per 7/12 los. Patient is aware. 

## 2021-06-02 NOTE — Progress Notes (Deleted)
Subjective:   PATIENT ID: Alec York GENDER: male DOB: 1951/02/05, MRN: [id number]   HPI  No chief complaint on file.   Reason for Visit: Follow-up; Thoracentesis  Alec York is a 70 year old male with history of non-Hodgkin's lymphoma in 2019, CAD s/p CABG, HTN, HLD who presents for thoracentesis.  Synopsis: He recently returned from [place] this year. While away he had an MI and underwent CABG in August 2021. Returned in February 2022 and was noted to have weight loss on his Oncology visit which he attributed to his recent cardiac events and loss of his son to COVID-19. He reported that while in [place] he had right lower lobe fluid. PET/CT 02/02/21 with unchanged mediastinal lymphadenopathy with similar hypermetabolic activity. Interval development of moderate right and small left pleural effusion. He has been seen by his Cardiologist who has optimized his diuresis and pleural fluid remains present. He was referred to Pulmonary for further management.   Since our last visit, he reports unchanged shortness of breath with exertion. Denies chest/pleuritic pain, fevers, chills, cough or wheezing. His activity is limited by leg pain and shortness of breath.   I have personally reviewed patient's past medical/family/social history/allergies/current medications.  Past Medical History:  Diagnosis Date   Allergy    Coronary artery disease    Diabetes mellitus without complication ([place])    Diabetic retinopathy ([place])    Disabled 2/2 retinopathy   GERD (gastroesophageal reflux disease)    Hyperlipidemia    Hypertension     No Known Allergies   Outpatient Medications Prior to Visit  Medication Sig Dispense Refill   Accu-Chek FastClix Lancets MISC Use to test sugars up to 4 times daily.  Dx Code: E11.319 102 each 12   aspirin EC 81 MG tablet Take 1 tablet (81 mg total) by mouth daily. Swallow whole. 90 tablet 3   blood glucose meter kit and supplies KIT Dispense based on  patient and insurance preference. Use up to four times daily as directed. Dx E11.319 1 each 0   Blood Glucose Monitoring Suppl (ACCU-CHEK GUIDE) w/Device KIT 1 each by Does not apply route 4 (four) times daily as needed. Use to test sugars up to 4 times daily.  Dx Code: E11.319 1 kit 0   Blood Pressure Monitoring (BLOOD PRESSURE CUFF) MISC 1 Units by Does not apply route daily. 1 each 0   clopidogrel (PLAVIX) 75 MG tablet Take 1 tablet (75 mg total) by mouth daily. 90 tablet 3   Dulaglutide (TRULICITY) 1.02 VO/5.3GU SOPN Inject 0.75 mg into the skin once a week. 2 mL 0   empagliflozin (JARDIANCE) 25 MG TABS tablet Take 1 tablet (25 mg total) by mouth daily. 90 tablet 1   glucose blood (ACCU-CHEK GUIDE) test strip Use to test sugars up to 4 times daily.  Dx Code: E11.319 200 each 12   iron polysaccharides (NIFEREX) 150 MG capsule Take 1 capsule (150 mg total) by mouth daily. 30 capsule 5   Lancet Devices (ACCU-CHEK SOFTCLIX) lancets Use as instructed up to 4 times daily.  Dx E11.319 400 each 3   Lancets Misc. (ACCU-CHEK FASTCLIX LANCET) KIT Use to test sugars up to 4 times daily.  Dx Code: E11.319 1 kit 0   metFORMIN (GLUCOPHAGE) 1000 MG tablet Take 1 tablet (1,000 mg total) by mouth 2 (two) times daily with a meal. 180 tablet 1   metoprolol succinate (TOPROL-XL) 50 MG 24 hr tablet Take 1 tablet (50 mg total) by  mouth at bedtime. Take with or immediately following a meal. 90 tablet 3   nitroGLYCERIN (NITROSTAT) 0.4 MG SL tablet Place 1 tablet (0.4 mg total) under the tongue every 5 (five) minutes as needed for chest pain. 30 tablet 12   PRESCRIPTION MEDICATION Take 1 capsule by mouth daily at 2 PM. Rosuvastatin,ASA,Clopidogrel -- prescribed in [place].     RELION PEN NEEDLES 31G X 6 MM MISC USE AS DIRECTED TO  CHECK  BLOOD  SUGAR 50 each 23   rosuvastatin (CRESTOR) 10 MG tablet Take 1 tablet (10 mg total) by mouth daily. 90 tablet 3   vitamin B-12 (CYANOCOBALAMIN) 500 MCG tablet Take 500 mcg by mouth  daily.     No facility-administered medications prior to visit.    Review of Systems  Constitutional:  Negative for chills, diaphoresis, fever, malaise/fatigue and weight loss.  HENT:  Negative for congestion.   Respiratory:  Positive for shortness of breath. Negative for cough, hemoptysis, sputum production and wheezing.   Cardiovascular:  Negative for chest pain, palpitations and leg swelling.    Objective:   There were no vitals filed for this visit.     Physical Exam: General: Well-appearing, no acute distress HENT: [place], AT Eyes: EOMI, no scleral icterus Respiratory: Clear to auscultation bilaterally.  No crackles, wheezing or rales Cardiovascular: RRR, -M/R/G, no JVD Extremities:-Edema,-tenderness Neuro: AAO x4, CNII-XII grossly intact Skin: Intact, no rashes or bruising Psych: Normal mood, normal affect   Data Reviewed:  Imaging: PET/CT 06/15/21 - Bilateral hypermetabolic lymph nodules in the neck, axilla and mediastinum. Parenchyma normal, no effusion or infiltrate PET/CT 02/02/21 - unchanged neck, axilla and mediastinal lymphadenopathy and metabolic activity. Interval development of bilateral pleural effusion R >L Ultrasound of right thorax 05/18/21  Small/moderate right pleural effusion visualized. No effusion seen on left thorax  PFT: None  Labs: CBC    Component Value Date/Time   WBC 22.4 (H) 06/01/2021 1050   RBC 4.64 06/01/2021 1050   HGB 12.0 (L) 06/01/2021 1050   HGB 10.9 (L) 01/12/2021 0823   HGB 12.3 (L) 03/22/2018 0941   HCT 37.7 (L) 06/01/2021 1050   HCT 38.9 03/22/2018 0941   PLT 287 06/01/2021 1050   PLT 216 01/12/2021 0823   PLT 235 03/22/2018 0941   MCV 81.3 06/01/2021 1050   MCV 85 03/22/2018 0941   MCH 25.9 (L) 06/01/2021 1050   MCHC 31.8 06/01/2021 1050   RDW 14.6 06/01/2021 1050   RDW 13.5 03/22/2018 0941   LYMPHSABS 14.0 (H) 06/01/2021 1050   LYMPHSABS 6.3 (H) 03/22/2018 0941   MONOABS 1.2 (H) 06/01/2021 1050   EOSABS 0.4  06/01/2021 1050   EOSABS 0.2 03/22/2018 0941   BASOSABS 0.1 06/01/2021 1050   BASOSABS 0.0 03/22/2018 0941   BMET    Component Value Date/Time   NA 138 06/01/2021 1050   NA 136 05/13/2021 0949   K 4.9 06/01/2021 1050   CL 103 06/01/2021 1050   CO2 26 06/01/2021 1050   GLUCOSE 159 (H) 06/01/2021 1050   BUN 12 06/01/2021 1050   BUN 15 05/13/2021 0949   CREATININE 0.93 06/01/2021 1050   CREATININE 1.21 12/22/2016 0939   CALCIUM 9.8 06/01/2021 1050   GFRNONAA >60 06/01/2021 1050   GFRNONAA 63 12/22/2016 0939   GFRAA >60 02/24/2020 0916   GFRAA 73 12/22/2016 0939   Elevated/unchanged WBC since 2021, mild anemia. Normal renal function and electrolytes. Elevated glucose  Imaging, labs and test noted above have been reviewed independently by me.  Procedure:   Thoracentesis  Procedure Note  Date:*** Time:***  Provider Performing:Jessalyn Hinojosa Rodman Pickle   Procedure: Thoracentesis with imaging guidance (64847)  Indication(s) Right Pleural Effusion  Consent Risks of the procedure as well as the alternatives and risks of each were explained to the patient and/or caregiver.  Consent for the procedure was obtained and is signed in the bedside chart  Anesthesia Topical only with 1% lidocaine    Time Out Verified patient identification, verified procedure, site/side was marked, verified correct patient position, special equipment/implants available, medications/allergies/relevant history reviewed, required imaging and test results available.   Sterile Technique Maximal sterile technique including full sterile barrier drape, hand hygiene, sterile gown, sterile gloves, mask, hair covering, sterile ultrasound probe cover (if used).  Procedure Description Ultrasound was used to identify appropriate pleural anatomy for placement and overlying skin marked.  Area of drainage cleaned and draped in sterile fashion. Lidocaine was used to anesthetize the skin and subcutaneous tissue.  *** cc's  of *** appearing fluid was drained from the {LEFT/RIGHT:[id number]} pleural space. Catheter then removed and bandaid applied to site.   Complications/Tolerance {[id number]:45146::"IQNV; patient tolerated the procedure well."} Chest X-ray is ordered to confirm no post-procedural complication.   EBL {EBL:[id number]::"Minimal"}   Specimen(s) {Specimen:[id number]::"Pleural fluid"}   Assessment & Plan:   Discussion:  Small/Moderate right pleural effusion Reportedly present since 06/2020. Low suspicion for infection given chronicity. Despite diuresis right pleural effusion remains. Reasonable to perform diagnostic and therapeutic thoracentesis since pleural effusion has not yet been evaluated with plan to rule out malignant effusion in setting of recent weight loss and history of lymphoma. I have discussed anticoagulation with his Cardiologist, Dr. Audie Box. Patient was planned to discontinue Plavix in August. Ok per Cardiology to stop Plavix prior to procedure and no need to restart.  Health Maintenance Immunization History  Administered Date(s) Administered   Influenza, High Dose Seasonal PF 08/05/2018, 08/24/2019   Influenza,inj,Quad PF,6+ Mos 08/19/2015, 12/22/2016   Influenza-Unspecified 08/24/2019   PFIZER(Purple Top)SARS-COV-2 Vaccination 01/19/2020, 02/12/2020   Pneumococcal Conjugate-13 08/10/2018   Pneumococcal Polysaccharide-23 12/22/2016   Tdap 02/17/2017   CT Lung Screen - not indicated.   No orders of the defined types were placed in this encounter. No orders of the defined types were placed in this encounter.   No follow-ups on file.  I have spent a total time of 45-minutes on the day of the appointment reviewing prior documentation, coordinating care and discussing medical diagnosis and plan with the patient/family. Imaging, labs and tests included in this note have been reviewed and interpreted independently by me.  [place], MD [facility] 06/02/2021 8:42 AM  Office Number [phone]

## 2021-06-04 ENCOUNTER — Ambulatory Visit: Payer: 59 | Admitting: Hematology

## 2021-06-04 ENCOUNTER — Ambulatory Visit: Payer: Self-pay | Admitting: Hematology

## 2021-06-04 ENCOUNTER — Other Ambulatory Visit: Payer: 59

## 2021-06-07 ENCOUNTER — Ambulatory Visit (INDEPENDENT_AMBULATORY_CARE_PROVIDER_SITE_OTHER): Payer: 59

## 2021-06-07 ENCOUNTER — Other Ambulatory Visit (HOSPITAL_COMMUNITY)
Admission: RE | Admit: 2021-06-07 | Discharge: 2021-06-07 | Disposition: A | Discharge status: Home / Self Care | Payer: 59 | Source: Ambulatory Visit | Attending: Pulmonary Disease | Admitting: Pulmonary Disease

## 2021-06-07 ENCOUNTER — Ambulatory Visit (INDEPENDENT_AMBULATORY_CARE_PROVIDER_SITE_OTHER): Payer: 59 | Admitting: Pulmonary Disease

## 2021-06-07 ENCOUNTER — Other Ambulatory Visit: Payer: Self-pay

## 2021-06-07 VITALS — BP 102/68 | HR 80 | Ht 68.0 in | Wt 118.2 lb

## 2021-06-07 DIAGNOSIS — J90 Pleural effusion, not elsewhere classified: Secondary | ICD-10-CM | POA: Insufficient documentation

## 2021-06-07 DIAGNOSIS — Z8572 Personal history of non-Hodgkin lymphomas: Secondary | ICD-10-CM | POA: Insufficient documentation

## 2021-06-07 LAB — BODY FLUID CELL COUNT WITH DIFFERENTIAL
Lymphs, Fluid: 95 %
Monocyte-Macrophage-Serous Fluid: 4 % — ABNORMAL LOW (ref 50–90)
Neutrophil Count, Fluid: 1 % (ref 0–25)
Total Nucleated Cell Count, Fluid: 552 cu mm (ref 0–1000)

## 2021-06-07 LAB — LACTATE DEHYDROGENASE: LDH: 116 U/L — ABNORMAL LOW (ref 120–250)

## 2021-06-07 LAB — LACTATE DEHYDROGENASE, PLEURAL OR PERITONEAL FLUID: LD, Fluid: 84 U/L — ABNORMAL HIGH (ref 3–23)

## 2021-06-07 LAB — GLUCOSE, PLEURAL OR PERITONEAL FLUID: Glucose, Fluid: 113 mg/dL

## 2021-06-07 LAB — PROTEIN, TOTAL: Total Protein: 7.9 g/dL (ref 6.0–8.3)

## 2021-06-07 NOTE — Progress Notes (Signed)
Subjective:   PATIENT ID: Alec York GENDER: male DOB: 10-20-1951, MRN: [id number]   HPI  Chief Complaint  Patient presents with   Follow-up    Thoracentesis for right pleural effusion      Reason for Visit: Follow-up pleural effusion  [name] is a 70 year old male with history of non-Hodgkin's lymphoma in 2019, CAD s/p CABG, HTN, HLD who presents for follow-up for pleural effusion.  Synopsis: He recently returned from [place] this year. While away he had an MI and underwent CABG in August 2021. Returned in February 2022 and was noted to have weight loss on his Oncology visit which he attributed to his recent cardiac events and loss of his son to COVID-19. He reported that while in [place] he had right lower lobe fluid. PET/CT 02/02/21 with unchanged mediastinal lymphadenopathy with similar hypermetabolic activity. Interval development of moderate right and small left pleural effusion. He has been seen by his Cardiologist who has optimized his diuresis and fluid remains present. He was referred to Pulmonary for further management. He reports shortness of breath with exertion. His activity is limited by his leg pain and shortness of breath. Denies chest or pleuritic pain. No cough, sputum production, hemoptysis, wheezing, fevers or chills.  Since our last visit, he has held his Plavix. He reports his shortness of breath is unchanged. He has fatigue.  I have personally reviewed patient's past medical/family/social history/allergies/current medications.   Past Medical History:  Diagnosis Date   Allergy    Coronary artery disease    Diabetes mellitus without complication ([place])    Diabetic retinopathy ([place])    Disabled 2/2 retinopathy   GERD (gastroesophageal reflux disease)    Hyperlipidemia    Hypertension      No Known Allergies   Outpatient Medications Prior to Visit  Medication Sig Dispense Refill   Accu-Chek FastClix Lancets MISC Use to test sugars up to 4  times daily.  Dx Code: E11.319 102 each 12   aspirin EC 81 MG tablet Take 1 tablet (81 mg total) by mouth daily. Swallow whole. 90 tablet 3   blood glucose meter kit and supplies KIT Dispense based on patient and insurance preference. Use up to four times daily as directed. Dx E11.319 1 each 0   Blood Glucose Monitoring Suppl (ACCU-CHEK GUIDE) w/Device KIT 1 each by Does not apply route 4 (four) times daily as needed. Use to test sugars up to 4 times daily.  Dx Code: E11.319 1 kit 0   Blood Pressure Monitoring (BLOOD PRESSURE CUFF) MISC 1 Units by Does not apply route daily. 1 each 0   Dulaglutide (TRULICITY) 4.49 QP/5.9FM SOPN Inject 0.75 mg into the skin once a week. 2 mL 0   empagliflozin (JARDIANCE) 25 MG TABS tablet Take 1 tablet (25 mg total) by mouth daily. 90 tablet 1   glucose blood (ACCU-CHEK GUIDE) test strip Use to test sugars up to 4 times daily.  Dx Code: E11.319 200 each 12   iron polysaccharides (NIFEREX) 150 MG capsule Take 1 capsule (150 mg total) by mouth daily. 30 capsule 5   Lancet Devices (ACCU-CHEK SOFTCLIX) lancets Use as instructed up to 4 times daily.  Dx E11.319 400 each 3   Lancets Misc. (ACCU-CHEK FASTCLIX LANCET) KIT Use to test sugars up to 4 times daily.  Dx Code: E11.319 1 kit 0   metFORMIN (GLUCOPHAGE) 1000 MG tablet Take 1 tablet (1,000 mg total) by mouth 2 (two) times daily with a  meal. 180 tablet 1   metoprolol succinate (TOPROL-XL) 50 MG 24 hr tablet Take 1 tablet (50 mg total) by mouth at bedtime. Take with or immediately following a meal. 90 tablet 3   nitroGLYCERIN (NITROSTAT) 0.4 MG SL tablet Place 1 tablet (0.4 mg total) under the tongue every 5 (five) minutes as needed for chest pain. 30 tablet 12   PRESCRIPTION MEDICATION Take 1 capsule by mouth daily at 2 PM. Rosuvastatin,ASA,Clopidogrel -- prescribed in [place].     RELION PEN NEEDLES 31G X 6 MM MISC USE AS DIRECTED TO  CHECK  BLOOD  SUGAR 50 each 23   rosuvastatin (CRESTOR) 10 MG tablet Take 1 tablet (10  mg total) by mouth daily. 90 tablet 3   vitamin B-12 (CYANOCOBALAMIN) 500 MCG tablet Take 500 mcg by mouth daily.     clopidogrel (PLAVIX) 75 MG tablet Take 1 tablet (75 mg total) by mouth daily. 90 tablet 3   No facility-administered medications prior to visit.    Review of Systems  Constitutional:  Negative for chills, diaphoresis, fever, malaise/fatigue and weight loss.  HENT:  Negative for congestion.   Respiratory:  Positive for shortness of breath. Negative for cough, hemoptysis, sputum production and wheezing.   Cardiovascular:  Negative for chest pain, palpitations and leg swelling.    Objective:   Vitals:   06/07/21 1059  BP: 102/68  Pulse: 80  SpO2: 99%  Weight: 118 lb 3.2 oz (53.6 kg)  Height: 5' 8"  (1.727 m)   SpO2: 99 %  Physical Exam: General: Well-appearing, no acute distress HENT: [place], AT Eyes: EOMI, no scleral icterus Lymph: no cervical lymphadenopathy Respiratory: Diminished bibasilar breath sounds.  No crackles, wheezing or rales Cardiovascular: RRR, -M/R/G, no JVD Extremities:-Edema,-tenderness Neuro: AAO x4, CNII-XII grossly intact   Data Reviewed:  Imaging: PET/CT 06/15/21 - Bilateral hypermetabolic lymph nodules in the neck, axilla and mediastinum. Parenchyma normal, no effusion or infiltrate PET/CT 02/02/21 - unchanged neck, axilla and mediastinal lymphadenopathy and metabolic activity. Interval development of bilateral pleural effusion R >L  Ultrasound of right thorax 06/07/21 Small right pleural effusion  CXR 06/07/21 post-thoracentesis - no discernible pneumothorax. Bibasilar pleural effusions present  PFT: None  Imaging, labs and test noted above have been reviewed independently by me.   Procedure:   Thoracentesis  Procedure Note  Date:06/07/21 Time:10:00 AM  Provider Performing:Jeferson Boozer Rodman Pickle   Procedure: Thoracentesis with imaging guidance (67209)  Indication(s) Pleural Effusion  Consent Risks of the procedure as well as  the alternatives and risks of each were explained to the patient and/or caregiver.  Consent for the procedure was obtained and is signed in the bedside chart  Anesthesia Topical only with 1% lidocaine    Time Out Verified patient identification, verified procedure, site/side was marked, verified correct patient position, special equipment/implants available, medications/allergies/relevant history reviewed, required imaging and test results available.   Sterile Technique Maximal sterile technique including full sterile barrier drape, hand hygiene, sterile gown, sterile gloves, mask, hair covering, sterile ultrasound probe cover (if used).  Procedure Description Ultrasound was used to identify appropriate pleural anatomy for placement and overlying skin marked.  Area of drainage cleaned and draped in sterile fashion. Lidocaine was used to anesthetize the skin and subcutaneous tissue.  200 cc's of clear straw-colored appearing fluid was drained from the right pleural space. Catheter then removed and bandaid applied to site.    Complications/Tolerance None; patient tolerated the procedure well. Chest X-ray is ordered to confirm no post-procedural complication.   EBL Minimal  Specimen(s) Pleural fluid       Assessment & Plan:   Discussion:  Small/Moderate right pleural effusion, likely malignant Reportedly present since 06/2020. Low suspicion for infection given chronicity. Despite diuresis right pleural effusion remains.   S/p thoracentesis on 06/07/21 with removal of 200 ml straw colored fluid. Will follow-up cell count, LDH, protein, glucose and culture. Vitals monitored post-procedure and determined stable prior to discharge.  He had significant pain with the procedure. He likely has entrapped/trapped lung and given his lack of respiratory relief post-procedure, he would not be good candidate for PleurX catheter if this is confirmed to be malignant.  ADDENDUM: Pleural  fluid was predominantly lymphocytic (95%) with 8-9% B-cells present. Cultures negative.  Patient's oncologist and cardiologist notified via Epic message on 06/10/21.  Health Maintenance Immunization History  Administered Date(s) Administered   Influenza, High Dose Seasonal PF 08/05/2018, 08/24/2019   Influenza,inj,Quad PF,6+ Mos 08/19/2015, 12/22/2016   Influenza-Unspecified 08/24/2019   PFIZER(Purple Top)SARS-COV-2 Vaccination 01/19/2020, 02/12/2020   Pneumococcal Conjugate-13 08/10/2018   Pneumococcal Polysaccharide-23 12/22/2016   Tdap 02/17/2017   CT Lung Screen - not indicated.   Orders Placed This Encounter  Procedures   Body fluid culture    Standing Status:   Future    Number of Occurrences:   1    Standing Expiration Date:   06/07/2022   DG Chest 2 View    Standing Status:   Future    Number of Occurrences:   1    Standing Expiration Date:   06/07/2022    Order Specific Question:   Reason for Exam (SYMPTOM  OR DIAGNOSIS REQUIRED)    Answer:   Pleural effusion    Order Specific Question:   Preferred imaging location?    Answer:   Internal   Body fluid cell count with differential    Standing Status:   Future    Number of Occurrences:   1    Standing Expiration Date:   06/07/2022   Protein, total    Standing Status:   Future    Number of Occurrences:   1    Standing Expiration Date:   06/07/2022   Protein Electro, Body Fluid    Standing Status:   Future    Number of Occurrences:   1    Standing Expiration Date:   06/07/2022   Glucose, Body Fluid Other    Standing Status:   Future    Number of Occurrences:   1    Standing Expiration Date:   06/07/2022   Lactate dehydrogenase    Standing Status:   Future    Number of Occurrences:   1    Standing Expiration Date:   06/07/2022   Lactate dehydrogenase    Standing Status:   Future    Number of Occurrences:   1    Standing Expiration Date:   06/07/2022   No orders of the defined types were placed in this  encounter.   No follow-ups on file.  I have spent a total time of 45-minutes on the day of the appointment reviewing prior documentation, coordinating care and discussing medical diagnosis and plan with the patient/family. Past medical history, allergies, medications were reviewed. Pertinent imaging, labs and tests included in this note have been reviewed and interpreted independently by me.   Alec York Rodman Pickle, MD [facility] Office Number [phone]

## 2021-06-10 ENCOUNTER — Ambulatory Visit: Payer: 59 | Admitting: Pharmacist

## 2021-06-10 LAB — PROTEIN ELECTRO, BODY FLUID
Albumin: 1.5 g/dL
Alpha-1 Globulin: 0.1 g/dL
Alpha-2 Globulin: 0.2 g/dL
Beta Globulin: 0.4 g/dL
Gamma Globulin: 0.5 g/dL
Protein, Total: 6.9 g/dL

## 2021-06-10 LAB — BODY FLUID CULTURE W GRAM STAIN: Culture: NO GROWTH

## 2021-06-10 LAB — CYTOLOGY - NON PAP

## 2021-06-10 LAB — SURGICAL PATHOLOGY

## 2021-06-11 ENCOUNTER — Encounter (HOSPITAL_COMMUNITY): Payer: 59

## 2021-06-14 ENCOUNTER — Other Ambulatory Visit: Payer: Self-pay

## 2021-06-14 ENCOUNTER — Ambulatory Visit (HOSPITAL_COMMUNITY)
Admission: RE | Admit: 2021-06-14 | Discharge: 2021-06-14 | Disposition: A | Discharge status: Home / Self Care | Payer: 59 | Source: Ambulatory Visit | Attending: Cardiovascular Disease | Admitting: Cardiovascular Disease

## 2021-06-14 DIAGNOSIS — M79605 Pain in left leg: Secondary | ICD-10-CM | POA: Insufficient documentation

## 2021-06-14 DIAGNOSIS — M79604 Pain in right leg: Secondary | ICD-10-CM | POA: Diagnosis present

## 2021-06-14 DIAGNOSIS — I739 Peripheral vascular disease, unspecified: Secondary | ICD-10-CM | POA: Insufficient documentation

## 2021-06-16 ENCOUNTER — Other Ambulatory Visit: Payer: Self-pay | Admitting: Family Medicine

## 2021-06-16 DIAGNOSIS — E11311 Type 2 diabetes mellitus with unspecified diabetic retinopathy with macular edema: Secondary | ICD-10-CM

## 2021-06-16 DIAGNOSIS — Z794 Long term (current) use of insulin: Secondary | ICD-10-CM

## 2021-06-18 ENCOUNTER — Telehealth: Payer: Self-pay | Admitting: Pulmonary Disease

## 2021-06-18 NOTE — Telephone Encounter (Signed)
Spoke with the pt and notified of response per Dr Loanne Drilling. He verbalized understanding. Forwarding to Dr Irene Limbo for further f/u. Thanks.

## 2021-06-18 NOTE — Telephone Encounter (Signed)
Please update patient on the following: Right pleural fluid is negative for infection but does show lymphocytes. I (Pulmonary) have previously sent a message to his oncologist (Dr. Irene Limbo) regarding the findings and will tag him to this phone encounter as well. Will defer to Oncology for final interpretation and plan.

## 2021-06-21 NOTE — Progress Notes (Signed)
HEMATOLOGY/ONCOLOGY CLNIC NOTE  Date of Service: 06/21/2021  Patient Care Team: Lattie Haw, MD as PCP - General (Family Medicine)  Melissa Montane, MD as ENT  CHIEF COMPLAINTS/PURPOSE OF CONSULTATION:  Non-Hodgkin's Lymphoma   HISTORY OF PRESENTING ILLNESS:   Alec York is a wonderful 70 y.o. male who has been referred to [place] by ENT Dr. Melissa Montane for evaluation and management of Non-Hodgkin's Lymphoma. He is accompanied today by his wife. The pt reports that he is doing well overall.   The pt had a Fine needle aspiration of the left neck mass on 05/16/18 which confirmed a B-cell Non-Hodgkin's lymphoma.   The pt reports well controlled DM and HTN. He denies neuropathy in his hands but this sometimes occurs in his legs. He also endorses vision changes related to his DM and he takes Lantus and Metformin. He denies heart or kidney problems, or previous surgeries. He notes that his PCP Dr. Wendee Beavers manages his DM.   He first noticed the right neck mass about 3 months ago and describes that the appearance was sudden. He denies any other lumps or bumps, fevers, chills, night sweats, unexpected weight loss, and change in appetite.   Of note prior to the patient's visit today, pt has had US Soft Tissue Head/Neck completed on 03/23/18 with results revealing Multiple well-circumscribed neck lesions are identified, consistent with lymph nodes, the largest of which measures 2.1 x 1.5 x 2.3 cm. Benign behavior is not established. Bulky adenopathy such as this could relate to lymphoma, or metastatic squamous cell carcinoma. There is no visible extranodal spread of tumor.   Most recent lab results (03/22/18) of CBC w/diff is as follows: all values are WNL except for WBC at 12.8k, HGB at 12.3, Lymphs abs at 6.3k.  On review of systems, pt reports left neck mass, eating well, and denies fevers, chills, night sweats, unexpected weight loss, noticing any other lumps or bumps, pain along the spine,  abdominal pains, leg swelling, testicular pain or swelling, skin rashes, and any other symptoms.   On Social Hx the pt reports that he quit smoking cigarettes 10 years ago, and had been smoking 5-6 cigarettes each day. He denies drinking any ETOH.  On Family Hx the pt denies cancer.   INTERVAL HISTORY:   I connected with Alec York  on 06/22/2021 by telephone and verified that I am speaking with the correct person using two identifiers.   I discussed the limitations of evaluation and management by telemedicine. The patient expressed understanding and agreed to proceed.   Other persons participating in the visit and their role in the encounter:                                                         - Reinaldo Raddle, Medical Scribe     Patient's location: Home Provider's location: [facility] returns today for management and evaluation of his Non-Hodgkin's Lymphoma. The patient's last visit with [place] was on 06/01/2021.Dr. Irene Limbo is acting as the interpretor today.  We called patient to f/u with his about his pleural fluid results.   The pt has had DG Chest 2 View ([id number]) on 06/07/2021, which revealed "Small to moderate bilateral pleural effusions. Ill-defined bibasilar opacities, which most likely represent loculated pleural  fluid and/or atelectasis when correlating with recent PET-CT. Infection is not excluded. Given the somewhat masslike appearance of the bibasilar opacities and the patient's known lymphoma, close interval follow-up imaging is recommended to exclude malignancy. Alternatively, chest CT (preferably with contrast) could further characterize.  "  Patient has thoracentesis on 7/18-- which shows 95 % lymphocytes -- suggestive of involvement of pleural fluid by lymphoma. We discussed that the lymphoma could be the cause or an incidental findings in the pleural fluid. We discussed the reasons to consider starting treatment for his lymphoma  including his pleural effusion and his fatigue.  Discussed in details different treatment options -- patient chooses to proceed with Rituxan.  On review of systems, pt reports fatigue, mild shortness of breath. No fevers/chills/night sweats.    MEDICAL HISTORY:  Past Medical History:  Diagnosis Date   Allergy    Coronary artery disease    Diabetes mellitus without complication (HCC)    Diabetic retinopathy ([place])    Disabled 2/2 retinopathy   GERD (gastroesophageal reflux disease)    Hyperlipidemia    Hypertension     SURGICAL HISTORY: Past Surgical History:  Procedure Laterality Date   CARDIAC CATHETERIZATION     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   CABG x 4 [place]    SOCIAL HISTORY: Social History   Socioeconomic History   Marital status: Married    Spouse name: Not on file   Number of children: 2   Years of education: Not on file   Highest education level: Not on file  Occupational History   Occupation: Retired  Tobacco Use   Smoking status: Former    Packs/day: 0.50    Years: 10.00    Pack years: 5.00    Types: Cigarettes    Start date: 1971    Quit date: 2010    Years since quitting: 12.5   Smokeless tobacco: Never  Vaping Use   Vaping Use: Never used  Substance and Sexual Activity   Alcohol use: No    Alcohol/week: 0.0 standard drinks   Drug use: No   Sexual activity: Yes    Birth control/protection: None  Other Topics Concern   Not on file  Social History Narrative   Not on file   Social Determinants of Health   Financial Resource Strain: Not on file  Food Insecurity: Not on file  Transportation Needs: Not on file  Physical Activity: Not on file  Stress: Not on file  Social Connections: Not on file  Intimate Partner Violence: Not on file    FAMILY HISTORY: Family History  Problem Relation Age of Onset   Heart disease Mother    Diabetes Neg Hx     ALLERGIES:  has No Known Allergies.  MEDICATIONS:  Current Outpatient  Medications  Medication Sig Dispense Refill   Accu-Chek FastClix Lancets MISC Use to test sugars up to 4 times daily.  Dx Code: E11.319 102 each 12   aspirin EC 81 MG tablet Take 1 tablet (81 mg total) by mouth daily. Swallow whole. 90 tablet 3   blood glucose meter kit and supplies KIT Dispense based on patient and insurance preference. Use up to four times daily as directed. Dx E11.319 1 each 0   Blood Glucose Monitoring Suppl (ACCU-CHEK GUIDE) w/Device KIT 1 each by Does not apply route 4 (four) times daily as needed. Use to test sugars up to 4 times daily.  Dx Code: E11.319 1 kit 0   Blood Pressure Monitoring (BLOOD PRESSURE CUFF)  MISC 1 Units by Does not apply route daily. 1 each 0   empagliflozin (JARDIANCE) 25 MG TABS tablet Take 1 tablet (25 mg total) by mouth daily. 90 tablet 1   glucose blood (ACCU-CHEK GUIDE) test strip Use to test sugars up to 4 times daily.  Dx Code: E11.319 200 each 12   iron polysaccharides (NIFEREX) 150 MG capsule Take 1 capsule (150 mg total) by mouth daily. 30 capsule 5   Lancet Devices (ACCU-CHEK SOFTCLIX) lancets Use as instructed up to 4 times daily.  Dx E11.319 400 each 3   Lancets Misc. (ACCU-CHEK FASTCLIX LANCET) KIT Use to test sugars up to 4 times daily.  Dx Code: E11.319 1 kit 0   metFORMIN (GLUCOPHAGE) 1000 MG tablet Take 1 tablet (1,000 mg total) by mouth 2 (two) times daily with a meal. 180 tablet 1   metoprolol succinate (TOPROL-XL) 50 MG 24 hr tablet Take 1 tablet (50 mg total) by mouth at bedtime. Take with or immediately following a meal. 90 tablet 3   nitroGLYCERIN (NITROSTAT) 0.4 MG SL tablet Place 1 tablet (0.4 mg total) under the tongue every 5 (five) minutes as needed for chest pain. 30 tablet 12   PRESCRIPTION MEDICATION Take 1 capsule by mouth daily at 2 PM. Rosuvastatin,ASA,Clopidogrel -- prescribed in [place].     RELION PEN NEEDLES 31G X 6 MM MISC USE AS DIRECTED TO  CHECK  BLOOD  SUGAR 50 each 23   rosuvastatin (CRESTOR) 10 MG tablet Take 1  tablet (10 mg total) by mouth daily. 90 tablet 3   TRULICITY 5.62 BW/3.8LH SOPN INJECT 0.75 MG INTO THE SKIN ONCE A WEEK 4 mL 0   vitamin B-12 (CYANOCOBALAMIN) 500 MCG tablet Take 500 mcg by mouth daily.     No current facility-administered medications for this visit.    REVIEW OF SYSTEMS:   10 Point review of Systems was done is negative except as noted above   PHYSICAL EXAMINATION: ECOG FS:1 - Symptomatic but completely ambulatory  There were no vitals filed for this visit.   Wt Readings from Last 3 Encounters:  06/07/21 118 lb 3.2 oz (53.6 kg)  06/02/21 117 lb 9.6 oz (53.3 kg)  06/01/21 117 lb 14.4 oz (53.5 kg)   There is no height or weight on file to calculate BMI.    Telehealth Visit.   LABORATORY DATA:  I have reviewed the data as listed  . CBC Latest Ref Rng & Units 06/01/2021 01/12/2021 02/24/2020  WBC 4.0 - 10.5 K/uL 22.4(H) 20.0(H) 26.6(H)  Hemoglobin 13.0 - 17.0 g/dL 12.0(L) 10.9(L) 12.2(L)  Hematocrit 39.0 - 52.0 % 37.7(L) 34.6(L) 38.5(L)  Platelets 150 - 400 K/uL 287 216 237    . CMP Latest Ref Rng & Units 06/07/2021 06/01/2021 05/13/2021  Glucose 70 - 99 mg/dL - 159(H) 256(H)  BUN 8 - 23 mg/dL - 12 15  Creatinine 0.61 - 1.24 mg/dL - 0.93 0.92  Sodium 135 - 145 mmol/L - 138 136  Potassium 3.5 - 5.1 mmol/L - 4.9 4.8  Chloride 98 - 111 mmol/L - 103 96  CO2 22 - 32 mmol/L - 26 24  Calcium 8.9 - 10.3 mg/dL - 9.8 9.8  Total Protein 6.0 - 8.3 g/dL 7.9 8.2(H) -  Total Bilirubin 0.3 - 1.2 mg/dL - 0.3 -  Alkaline Phos 38 - 126 U/L - 83 -  AST 15 - 41 U/L - 11(L) -  ALT 0 - 44 U/L - 6 -   . Lab Results  Component Value Date   LDH 116 (L) 06/07/2021    06/18/18 Right Cervical LN Needle/core Biopsy:    05/23/18 Fine Needle Aspiration Flow Cytometry:    RADIOGRAPHIC STUDIES: I have personally reviewed the radiological images as listed and agreed with the findings in the report. DG Chest 2 View  Result Date: 06/07/2021 CLINICAL DATA:  Pleural effusion.  EXAM: CHEST - 2 VIEW COMPARISON:  PET CT February 02, 2021. FINDINGS: Small to moderate bilateral pleural effusions. Ill-defined bibasilar opacities. No visible pneumothorax. Cardiomediastinal silhouette is within normal limits. Median sternotomy. No acute osseous abnormality. Polyarticular degenerative change. IMPRESSION: Small to moderate bilateral pleural effusions. Ill-defined bibasilar opacities, which most likely represent loculated pleural fluid and/or atelectasis when correlating with recent PET-CT. Infection is not excluded. Given the somewhat masslike appearance of the bibasilar opacities and the patient's known lymphoma, close interval follow-up imaging is recommended to exclude malignancy. Alternatively, chest CT (preferably with contrast) could further characterize. Electronically Signed   By: Margaretha Sheffield MD   On: 06/07/2021 11:04   VAS [place] LE ART SEG MULTI (Segm&LE Reynauds)  Result Date: 06/14/2021  LOWER EXTREMITY DOPPLER STUDY Patient Name:  Alec York  Date of Exam:   06/14/2021 Medical Rec #: [id number]          Accession #:    [id number] Date of Birth: 10/20/1951          Patient Gender: M Patient Age:   70Y Exam Location:  [facility] Procedure:      VAS [place] LOWER EXT ART SEG MULTI (SEGMENTALS & LE RAYNAUDS) Referring Phys: [id number] [place] --------------------------------------------------------------------------------  Indications: Peripheral artery disease. High Risk Factors: Hypertension, hyperlipidemia, Diabetes, past history of                    smoking, prior MI, coronary artery disease. Other Factors: Patient complains of bilateral leg pain when he starts walking;                he denies true claudication symptoms. He also complains of                numbness and tingling in his feet.  Performing Technologist: Wilkie Aye RVT  Examination Guidelines: A complete evaluation includes at minimum, Doppler waveform signals and systolic blood pressure reading at the level  of bilateral brachial, anterior tibial, and posterior tibial arteries, when vessel segments are accessible. Bilateral testing is considered an integral part of a complete examination. Photoelectric Plethysmograph (PPG) waveforms and toe systolic pressure readings are included as required and additional duplex testing as needed. Limited examinations for reoccurring indications may be performed as noted.  ABI Findings: +---------+------------------+-----+---------+--------+ Right    Rt Pressure (mmHg)IndexWaveform Comment  +---------+------------------+-----+---------+--------+ Brachial 138                                      +---------+------------------+-----+---------+--------+ CFA                             triphasic         +---------+------------------+-----+---------+--------+ Popliteal                       triphasic         +---------+------------------+-----+---------+--------+ ATA      160  1.16 biphasic          +---------+------------------+-----+---------+--------+ PTA      162               1.17 triphasic         +---------+------------------+-----+---------+--------+ PERO     156               1.13 triphasic         +---------+------------------+-----+---------+--------+ Great Toe144               1.04 Normal            +---------+------------------+-----+---------+--------+ +---------+------------------+-----+---------+-------+ Left     Lt Pressure (mmHg)IndexWaveform Comment +---------+------------------+-----+---------+-------+ Brachial 135                                     +---------+------------------+-----+---------+-------+ CFA                             triphasic        +---------+------------------+-----+---------+-------+ Popliteal                       triphasic        +---------+------------------+-----+---------+-------+ ATA      156               1.13 triphasic         +---------+------------------+-----+---------+-------+ PTA      170               1.23 triphasic        +---------+------------------+-----+---------+-------+ PERO     149               1.08 triphasic        +---------+------------------+-----+---------+-------+ Great Toe124               0.90 Normal           +---------+------------------+-----+---------+-------+ +-------+-----------+-----------+ ABI/TBIToday's ABIToday's TBI +-------+-----------+-----------+ Right  1.17       1.04        +-------+-----------+-----------+ Left   1.23       0.90        +-------+-----------+-----------+   Summary: Right: Resting right ankle-brachial index is within normal range. No evidence of significant right lower extremity arterial disease. The right toe-brachial index is normal. Left: Resting left ankle-brachial index is within normal range. No evidence of significant left lower extremity arterial disease. The left toe-brachial index is normal.  *See table(s) above for measurements and observations.  Electronically signed by Quay Burow MD on 06/14/2021 at 5:48:07 PM.    Final     ASSESSMENT & PLAN:   70 y.o. male with  1. 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  05/16/18 Flow cytometry results which revealed NHL B-Cell lymphoma, and discussed that this is not completely diagnostic   03/23/18 US Soft Tissue Head/Neck revealed Multiple well-circumscribed neck lesions are identified, consistent with lymph nodes, the largest of which measures 2.1 x 1.5 x 2.3 cm. Benign behavior is not established. Bulky adenopathy such as this could relate to lymphoma, or metastatic squamous cell carcinoma. There is no visible extranodal spread of tumor.   06/18/18 biopsy favored a low grade Marginal Zone Lymphoma 06/15/18 PET/CT revealed Enlarged and hypermetabolic lymph nodes involving the neck, axilla, subpectoral and mediastinal lymph nodes. No  lymphadenopathy  below the diaphragm. No findings for osseous lymphoma  06/07/18 ECHO for treatment planning revealed a normal ejection fraction  PLAN: -Discussed with patient pleural fluid analysis results showing involving of pleural fluid with MZL. -given concerning for possible worsening pleural effusion and his fatigue and anorexia offered patient option to starting treatment with Rituxan monotherapy to improve MZL control to evaluate if lymphoma response will help with his symptoms of fatigue and shortness of breath -appreciate input from Pulmonology. -patient would like to proceed with Rituxan monotherapy. -he will f/u with PCP to optimize DM2 mx and have a plan to address hyperglycemia arising from steroids use to with treatment. -dicussed and patient agreeable with getting Evusheld.  2. CAD s/p recent CABG 3. Uncontrolled DM2 4. HTN 5. PAD PLAN F/u with cardiology for optimization of CAD mx F/u with PCP for mx esp for uncontrolled DM2 - might need to consider endocrinology referral if needed.   FOLLOW UP: Chemo counseling for Rituxan Please schedule to start Rituxan infusions in 1 to 2 weeks (weekly for 4 doses) Labs with each treatment MD visit with C1,C2 and C4. Evusheld injection with C2 of Rituxan.     The total time spent in the appointment was 20 minutes and more than 50% was on counseling and direct patient cares.  All of the patient's questions were answered with apparent satisfaction. The patient knows to call the clinic with any problems, questions or concerns.    Sullivan Lone MD [place] AAHIVMS [facility] Hematology/Oncology Physician [facility]  (Office):       [phone] (Work cell):  [phone] (Fax):           [phone]  06/21/2021 8:16 PM  I, Reinaldo Raddle, am acting as scribe for Dr. Sullivan Lone, MD.   .I have reviewed the above documentation for accuracy and completeness, and I agree with the above. Brunetta Genera MD

## 2021-06-22 ENCOUNTER — Inpatient Hospital Stay: Payer: 59 | Attending: Hematology | Admitting: Hematology

## 2021-06-22 DIAGNOSIS — D84821 Immunodeficiency due to drugs: Secondary | ICD-10-CM | POA: Insufficient documentation

## 2021-06-22 DIAGNOSIS — J910 Malignant pleural effusion: Secondary | ICD-10-CM | POA: Diagnosis not present

## 2021-06-22 DIAGNOSIS — C8590 Non-Hodgkin lymphoma, unspecified, unspecified site: Secondary | ICD-10-CM | POA: Diagnosis not present

## 2021-06-22 DIAGNOSIS — Z5112 Encounter for antineoplastic immunotherapy: Secondary | ICD-10-CM | POA: Insufficient documentation

## 2021-06-22 DIAGNOSIS — Z87891 Personal history of nicotine dependence: Secondary | ICD-10-CM | POA: Insufficient documentation

## 2021-06-22 DIAGNOSIS — J90 Pleural effusion, not elsewhere classified: Secondary | ICD-10-CM | POA: Insufficient documentation

## 2021-06-22 DIAGNOSIS — C8308 Small cell B-cell lymphoma, lymph nodes of multiple sites: Secondary | ICD-10-CM | POA: Insufficient documentation

## 2021-06-22 DIAGNOSIS — Z298 Encounter for other specified prophylactic measures: Secondary | ICD-10-CM | POA: Insufficient documentation

## 2021-06-23 ENCOUNTER — Telehealth: Payer: Self-pay | Admitting: Hematology

## 2021-06-23 NOTE — Telephone Encounter (Signed)
Scheduled follow-up appointments per 8/2 los. Patient is aware. 

## 2021-06-24 ENCOUNTER — Encounter: Payer: Self-pay | Admitting: Hematology

## 2021-06-24 ENCOUNTER — Encounter: Payer: Self-pay | Admitting: Family Medicine

## 2021-06-24 ENCOUNTER — Ambulatory Visit (INDEPENDENT_AMBULATORY_CARE_PROVIDER_SITE_OTHER): Payer: 59 | Admitting: Family Medicine

## 2021-06-24 ENCOUNTER — Other Ambulatory Visit: Payer: Self-pay

## 2021-06-24 ENCOUNTER — Ambulatory Visit (INDEPENDENT_AMBULATORY_CARE_PROVIDER_SITE_OTHER): Payer: 59 | Admitting: Pharmacist

## 2021-06-24 DIAGNOSIS — E1169 Type 2 diabetes mellitus with other specified complication: Secondary | ICD-10-CM | POA: Diagnosis not present

## 2021-06-24 DIAGNOSIS — C8590 Non-Hodgkin lymphoma, unspecified, unspecified site: Secondary | ICD-10-CM | POA: Insufficient documentation

## 2021-06-24 DIAGNOSIS — J90 Pleural effusion, not elsewhere classified: Secondary | ICD-10-CM

## 2021-06-24 DIAGNOSIS — E11319 Type 2 diabetes mellitus with unspecified diabetic retinopathy without macular edema: Secondary | ICD-10-CM

## 2021-06-24 DIAGNOSIS — E119 Type 2 diabetes mellitus without complications: Secondary | ICD-10-CM | POA: Insufficient documentation

## 2021-06-24 DIAGNOSIS — Z794 Long term (current) use of insulin: Secondary | ICD-10-CM

## 2021-06-24 DIAGNOSIS — I2510 Atherosclerotic heart disease of native coronary artery without angina pectoris: Secondary | ICD-10-CM | POA: Diagnosis not present

## 2021-06-24 DIAGNOSIS — E11311 Type 2 diabetes mellitus with unspecified diabetic retinopathy with macular edema: Secondary | ICD-10-CM

## 2021-06-24 MED ORDER — INSULIN DEGLUDEC 100 UNIT/ML ~~LOC~~ SOPN: 10.0000 [IU] | PEN_INJECTOR | Freq: Every day | SUBCUTANEOUS | 0 refills | Status: DC

## 2021-06-24 MED ORDER — ACCU-CHEK GUIDE VI STRP: ORAL_STRIP | 12 refills | Status: DC

## 2021-06-24 MED ORDER — ACCU-CHEK GUIDE W/DEVICE KIT
1.0000 | PACK | Freq: Four times a day (QID) | 0 refills | Status: DC | PRN
Start: 2021-06-24 — End: 2023-12-08

## 2021-06-24 MED ORDER — ACCU-CHEK FASTCLIX LANCETS MISC: 12 refills | Status: DC

## 2021-06-24 MED ORDER — RELION PEN NEEDLES 31G X 6 MM MISC: 23 refills | Status: DC

## 2021-06-24 NOTE — Progress Notes (Signed)
START ON PATHWAY REGIMEN - Lymphoma and CLL     Administer weekly:     Rituximab-xxxx   **Always confirm dose/schedule in your pharmacy ordering system**  Patient Characteristics: Marginal Zone Lymphoma, Systemic, First Line, Symptomatic Disease Type: Marginal Zone Lymphoma Disease Type: Not Applicable Disease Type: Not Applicable Localized or Systemic Disease<= Systemic Line of Therapy: First Line Asymptomatic or Symptomatic<= Symptomatic Intent of Therapy: Non-Curative / Palliative Intent, Discussed with Patient 

## 2021-06-24 NOTE — Patient Instructions (Signed)
Thank you for coming to see me today. It was a pleasure. Today we discussed your diabetes. You will see rachelle the pharmacist who might consider starting you on insulin. Continue trulicity, jardiance and metformin.   Follow up with Dr Irene Limbo for cancer treatment.  Please follow-up with me in 1 month.   If you have any questions or concerns, please do not hesitate to call the office at [phone].  Best wishes,   Dr Posey Pronto

## 2021-06-24 NOTE — Assessment & Plan Note (Signed)
T2DM is not controlled. Medication adherence appears optimal. Additional pharmacotherapy is warranted. Will not titrate Trulicity at this time due to patient already experiencing weight loss and decrease in appetite. Will add on Tresiba 10 units once daily in the morning. Patient educated on purpose, proper use and potential adverse effects of Tresiba.  Following instruction patient verbalized understanding of treatment plan.   1. Started basal insulin degludec [name]) 10 units once daily in AM 2. Continued GLP-1 dulaglutide (Trulicity) 0.'75mg'$  once weekly.  3. Continued SGLT2-I empagliflozin (Jardiance) '25mg'$ . 4. Continued metformin '1000mg'$  BID 5. Counseled on s/sx of and management of hypoglycemia 6. Next A1C anticipated September 2022.

## 2021-06-24 NOTE — Progress Notes (Signed)
   Subjective:    Patient ID: Alec York, male    DOB: 06/27/1951, 70 y.o.   MRN: [id number]  HPI Patient is a 70 y.o. male who presents for diabetes management. He is in good spirits and presents with assistance of cane with wife. Patient was referred on 05/07/21 and last seen by new PCP prior to appointment today.  Patient reports he has had no issues with taking his Trulicity 0.'75mg'$  once weekly. He is concerned about weight loss and appetite though as he also has non-Hodkin's lymphoma and reports he will begin treatment next week.  Insurance coverage/medication affordability: Medicare  Current diabetes medications include: Metformin '1000mg'$  BID, jardiance '25mg'$ , dulaglutide (Trulicity) 0.'75mg'$  once weekly on Thursday Patient states that He is taking his diabetes medications as prescribed. Patient reports adherence with medications.   Objective:   Labs:   Lab Results  Component Value Date   HGBA1C 11.0 (A) 05/13/2021   HGBA1C 8.2 (A) 01/28/2021   HGBA1C 10.3 (A) 01/10/2020    Lab Results  Component Value Date   MICRALBCREAT 34 (H) 09/25/2019    Lipid Panel     Component Value Date/Time   CHOL 104 01/28/2021 1449   TRIG 101 01/28/2021 1449   HDL 36 (L) 01/28/2021 1449   CHOLHDL 2.9 01/28/2021 1449   CHOLHDL 2.9 05/31/2016 0843   VLDL 32 (H) 05/31/2016 0843   LDLCALC 49 01/28/2021 1449    Clinical Atherosclerotic Cardiovascular Disease (ASCVD): Yes The ASCVD Risk score Mikey Bussing DC Jr., et al., 2013) failed to calculate for the following reasons:   The valid total cholesterol range is 130 to 320 mg/dL   Patient reports his fasting blood glucose readings have been in the 250-300 range  Assessment/Plan:   T2DM is not controlled. Medication adherence appears optimal. Additional pharmacotherapy is warranted. Will not titrate Trulicity at this time due to patient already experiencing weight loss and decrease in appetite. Will add on Tresiba 10 units once daily in the  morning. Patient educated on purpose, proper use and potential adverse effects of Tresiba.  Following instruction patient verbalized understanding of treatment plan.   Started basal insulin degludec [name]) 10 units once daily in AM Continued GLP-1 dulaglutide (Trulicity) 0.'75mg'$  once weekly.  Continued SGLT2-I empagliflozin (Jardiance) '25mg'$ . Continued metformin '1000mg'$  BID Counseled on s/sx of and management of hypoglycemia Next A1C anticipated September 2022.   ASCVD risk - secondary prevention in patient with diabetes. Last LDL is controlled.   Continued aspirin 81 mg  Continued rosuvastatin 10 mg.   Follow-up appointment in two weeks via telephone to review sugar readings. Written patient instructions provided.  This appointment required 30 minutes of direct patient care.  Thank you for involving pharmacy to assist in providing this patient's care.  Patient seen with Meyer Russel, PharmD Candidate, Joseph Art, PharmD - PGY-1 Resident, and Rebbeca Paul, PharmD - PGY-2 Ambulatory Care Resident

## 2021-06-24 NOTE — Assessment & Plan Note (Signed)
>>  ASSESSMENT AND PLAN FOR CORONARY ARTERY DISEASE INVOLVING NATIVE HEART WITHOUT ANGINA PECTORIS WRITTEN ON 06/24/2021  9:36 AM BY PATEL, POONAM, MD  Continue aspirin 81 mg and rosuvastatin 10 mg daily.  Follow-up with cardiology.

## 2021-06-24 NOTE — Patient Instructions (Addendum)
Alec York it was a pleasure seeing you today.   Please do the following:  We are starting a new medication called [place]. This is insulin. You will dial the pen to 10 units and inject this medication every morning as directed today during your appointment. If you have any questions or if you believe something has occurred because of this change, call me or your doctor to let one of [place] know.  Continue checking blood sugars at home. It's really important that you record these and bring these in to your next doctor's appointment.  Continue making the lifestyle changes we've discussed together during our visit. Diet and exercise play a significant role in improving your blood sugars.  Follow-up with me in two weeks over the phone.   Hypoglycemia or low blood sugar:   Low blood sugar can happen quickly and may become an emergency if not treated right away.   While this shouldn't happen often, it can be brought upon if you skip a meal or do not eat enough. Also, if your insulin or other diabetes medications are dosed too high, this can cause your blood sugar to go to low.   Warning signs of low blood sugar include: Feeling shaky or dizzy Feeling weak or tired  Excessive hunger Feeling anxious or upset  Sweating even when you aren't exercising  What to do if I experience low blood sugar? Follow the Rule of 15 Check your blood sugar with your meter. If lower than 70, proceed to step 2.  Treat with 15 grams of fast acting carbs which is found in 3-4 glucose tablets. If none are available you can try hard candy, 1 tablespoon of sugar or honey,4 ounces of fruit juice, or 6 ounces of REGULAR soda.  Re-check your sugar in 15 minutes. If it is still below 70, do what you did in step 2 again. If your blood sugar has come back up, go ahead and eat a snack or small meal made up of complex carbs (ex. Whole grains) and protein at this time to avoid recurrence of low blood sugar.

## 2021-06-24 NOTE — Assessment & Plan Note (Signed)
Continue aspirin 81 mg and rosuvastatin 10 mg daily.  Follow-up with cardiology.

## 2021-06-24 NOTE — Assessment & Plan Note (Addendum)
Last A1c 11.  Since then previous PCP increased Jardiance to 25 mg.  BMP was checked at this time and also patient has had a recent CMP at the cancer center showing normal kidney function.  No changes made to medications today. Continue Jardiance, Trulicity and metformin at current doses.  Patient has a appointment with [place] today to discuss diabetes management further.  Discussed the option of adding insulin as his A1c is still very high. Explained the difference between insulin and Trulicity injections.  Lifestyle counseling provided to patient.  Patient expressed understanding.  Follow-up A1c in 1 to 2 months.

## 2021-06-24 NOTE — Progress Notes (Signed)
     SUBJECTIVE:   CHIEF COMPLAINT / HPI:   Alec York is a 70 y.o. male presents for diabetes follow up    Diabetes Patient's current diabetic medications include Jardiance, Metformin and Trulicity. Tolerating well without side effects.  Patient endorses compliance with these medications. Unable to check CBGs at home as the readings have shown error. Unsure whether the strips/glucometer aren't working. Patient's last A1c was  Lab Results  Component Value Date   HGBA1C 11.0 (A) 05/13/2021   HGBA1C 8.2 (A) 01/28/2021   HGBA1C 10.3 (A) 01/10/2020  Jardiance dose was increased in June by Dr Hildred Alamin.  Denies abdominal pain, blurred vision, polyuria, polydipsia, hypoglycemia. Patient states they understand that diet and exercise can help with her diabetes.    Last Microalbumin, LDL, Creatinine: Lab Results  Component Value Date   MICROALBUR 30 03/22/2018   LDLCALC 49 01/28/2021   CREATININE 0.93 06/01/2021   Small/moderate right pleural effusion likely malignant  Reports dyspnea and weight loss since April 2021. Follows pulmonology and last saw on then on 06/07/21. S/p thoracentesis on 06/07/21 with removal of straw colored fluid which showed predominantly lymphocytic B cells. Referred to Dr Irene Limbo, Oncology. He spoke to him on the phone on 06/22/21. He has many upcoming appointments for education and potential cancer treatment (Rituxan).   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 Takes ASA '81mg'$ , Rosuvastin.    [place] Office Visit from 06/24/2021 in [place]  PHQ-9 Total Score 0       PERTINENT  PMH / PSH: DM, HTN, HFpEF, non-Hodgkin's lymphoma, CAD s/p CABG, HTN, HLD  OBJECTIVE:   BP 125/79   Pulse 72   Wt 117 lb 3.2 oz (53.2 kg)   SpO2 100%   BMI 17.82 kg/m    General: Alert, no acute distress Cardio: Normal S1 and S2, RRR, no r/m/g Pulm: Reduced air entry lower lung bases normal work of breathing Extremities: No peripheral edema.  Neuro: Cranial  nerves grossly intact   ASSESSMENT/PLAN:   Diabetes (HCC) Last A1c 11.  Since then previous PCP increased Jardiance to 25 mg.  BMP was checked at this time and also patient has had a recent CMP at the cancer center showing normal kidney function.  No changes made to medications today. Continue Jardiance, Trulicity and metformin at current doses.  Patient has a appointment with [place] today to discuss diabetes management further.  Discussed the option of adding insulin as his A1c is still very high. Explained the difference between insulin and Trulicity injections.  Lifestyle counseling provided to patient.  Patient expressed understanding.  Follow-up A1c in 1 to 2 months.  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 S/p thoracocentesis with pulmonology on 18th July. Patient is endorsing mild dyspnea since thoracocentesis, sats 100% on air and no respiratory distress todayAlso answered patient's questions regarding non-Hodgkin's lymphoma and why he has fluid in his lungs. Recommend close follow-up with Dr. Irene Limbo, oncology and Dr. Loanne Drilling pulmonology.  Coronary artery disease involving native heart without angina pectoris Continue aspirin 81 mg and rosuvastatin 10 mg daily.  Follow-up with cardiology.    Lattie Haw, MD PGY-3 [place]

## 2021-06-24 NOTE — Assessment & Plan Note (Addendum)
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 S/p thoracocentesis with pulmonology on 18th July. Patient is endorsing mild dyspnea since thoracocentesis, sats 100% on air and no respiratory distress todayAlso answered patient's questions regarding non-Hodgkin's lymphoma and why he has fluid in his lungs. Recommend close follow-up with Dr. Irene Limbo, oncology and Dr. Loanne Drilling pulmonology.

## 2021-06-25 ENCOUNTER — Telehealth: Payer: Self-pay | Admitting: *Deleted

## 2021-06-25 NOTE — Telephone Encounter (Signed)
Faxed received requesting Rx for softclix lancets, states the kit does not come with a fastclix device.  Veryl Abril Zimmerman Rumple, CMA

## 2021-06-27 ENCOUNTER — Other Ambulatory Visit: Payer: Self-pay | Admitting: Family Medicine

## 2021-06-27 MED ORDER — ACCU-CHEK SOFTCLIX LANCETS MISC: 12 refills | Status: DC

## 2021-06-27 NOTE — Telephone Encounter (Signed)
I have sent in this refill. Thank you.

## 2021-06-29 ENCOUNTER — Other Ambulatory Visit: Payer: Self-pay

## 2021-06-29 ENCOUNTER — Other Ambulatory Visit: Payer: Self-pay | Admitting: Hematology

## 2021-06-29 ENCOUNTER — Inpatient Hospital Stay: Payer: 59

## 2021-06-29 ENCOUNTER — Other Ambulatory Visit: Payer: 59

## 2021-06-29 NOTE — Progress Notes (Signed)
Pharmacist Chemotherapy Monitoring - Initial Assessment    Anticipated start date: 07/06/21   The following has been reviewed per standard work regarding the patient's treatment regimen: The patient's diagnosis, treatment plan and drug doses, and organ/hematologic function Lab orders and baseline tests specific to treatment regimen  The treatment plan start date, drug sequencing, and pre-medications Prior authorization status  Patient's documented medication list, including drug-drug interaction screen and prescriptions for anti-emetics and supportive care specific to the treatment regimen The drug concentrations, fluid compatibility, administration routes, and timing of the medications to be used The patient's access for treatment and lifetime cumulative dose history, if applicable  The patient's medication allergies and previous infusion related reactions, if applicable   Changes made to treatment plan:  N/A  Follow up needed:  Pending authorization for treatment    Larene Beach, [place], 06/29/2021  3:34 PM

## 2021-06-30 ENCOUNTER — Encounter: Payer: Self-pay | Admitting: Hematology

## 2021-07-02 ENCOUNTER — Telehealth: Payer: Self-pay | Admitting: Hematology

## 2021-07-02 NOTE — Telephone Encounter (Signed)
Rescheduled upcoming appointment due to provider's emergency. Patient is aware of changes. 

## 2021-07-05 ENCOUNTER — Other Ambulatory Visit: Payer: Self-pay | Admitting: Physician Assistant

## 2021-07-05 ENCOUNTER — Encounter: Payer: Self-pay | Admitting: Hematology

## 2021-07-05 DIAGNOSIS — C8590 Non-Hodgkin lymphoma, unspecified, unspecified site: Secondary | ICD-10-CM

## 2021-07-05 NOTE — Progress Notes (Signed)
Called pt to introduce myself as his Arboriculturist.  Pt has 2 insurances so copay assistance isn't going to be needed.  I informed him of the [employer] and went over what it covers.  Pt would like to apply so he will bring his proof of income on 07/06/21.  If approved I will give him an expense sheet and my card for any questions or concerns he may have in the future.

## 2021-07-06 ENCOUNTER — Inpatient Hospital Stay: Payer: 59

## 2021-07-06 ENCOUNTER — Inpatient Hospital Stay (HOSPITAL_BASED_OUTPATIENT_CLINIC_OR_DEPARTMENT_OTHER): Payer: 59 | Admitting: Physician Assistant

## 2021-07-06 ENCOUNTER — Encounter: Payer: Self-pay | Admitting: Hematology

## 2021-07-06 ENCOUNTER — Other Ambulatory Visit: Payer: Self-pay

## 2021-07-06 VITALS — BP 117/57 | HR 74 | Temp 97.9°F | Resp 18

## 2021-07-06 VITALS — BP 114/63 | HR 70 | Temp 97.6°F | Resp 17 | Wt 117.7 lb

## 2021-07-06 DIAGNOSIS — C8590 Non-Hodgkin lymphoma, unspecified, unspecified site: Secondary | ICD-10-CM

## 2021-07-06 DIAGNOSIS — Z298 Encounter for other specified prophylactic measures: Secondary | ICD-10-CM | POA: Diagnosis not present

## 2021-07-06 DIAGNOSIS — D84821 Immunodeficiency due to drugs: Secondary | ICD-10-CM | POA: Diagnosis not present

## 2021-07-06 DIAGNOSIS — C8308 Small cell B-cell lymphoma, lymph nodes of multiple sites: Secondary | ICD-10-CM | POA: Diagnosis not present

## 2021-07-06 DIAGNOSIS — Z87891 Personal history of nicotine dependence: Secondary | ICD-10-CM | POA: Diagnosis not present

## 2021-07-06 DIAGNOSIS — Z5112 Encounter for antineoplastic immunotherapy: Secondary | ICD-10-CM | POA: Diagnosis present

## 2021-07-06 DIAGNOSIS — J90 Pleural effusion, not elsewhere classified: Secondary | ICD-10-CM | POA: Diagnosis not present

## 2021-07-06 LAB — CMP (CANCER CENTER ONLY)
ALT: 10 U/L (ref 0–44)
AST: 12 U/L — ABNORMAL LOW (ref 15–41)
Albumin: 3.7 g/dL (ref 3.5–5.0)
Alkaline Phosphatase: 82 U/L (ref 38–126)
Anion gap: 11 (ref 5–15)
BUN: 14 mg/dL (ref 8–23)
CO2: 24 mmol/L (ref 22–32)
Calcium: 9.5 mg/dL (ref 8.9–10.3)
Chloride: 101 mmol/L (ref 98–111)
Creatinine: 0.93 mg/dL (ref 0.61–1.24)
GFR, Estimated: >60 mL/min (ref >60)
Glucose, Bld: 244 mg/dL — ABNORMAL HIGH (ref 70–99)
Potassium: 4.5 mmol/L (ref 3.5–5.1)
Sodium: 136 mmol/L (ref 135–145)
Total Bilirubin: 0.3 mg/dL (ref 0.3–1.2)
Total Protein: 8 g/dL (ref 6.5–8.1)

## 2021-07-06 LAB — CBC WITH DIFFERENTIAL (CANCER CENTER ONLY)
Abs Immature Granulocytes: 0.06 10*3/uL (ref 0.00–0.07)
Basophils Absolute: 0.1 10*3/uL (ref 0.0–0.1)
Basophils Relative: 1 %
Eosinophils Absolute: 0.2 10*3/uL (ref 0.0–0.5)
Eosinophils Relative: 1 %
HCT: 36.6 % — ABNORMAL LOW (ref 39.0–52.0)
Hemoglobin: 11.7 g/dL — ABNORMAL LOW (ref 13.0–17.0)
Immature Granulocytes: 0 %
Lymphocytes Relative: 53 %
Lymphs Abs: 9.8 10*3/uL — ABNORMAL HIGH (ref 0.7–4.0)
MCH: 25.7 pg — ABNORMAL LOW (ref 26.0–34.0)
MCHC: 32 g/dL (ref 30.0–36.0)
MCV: 80.3 fL (ref 80.0–100.0)
Monocytes Absolute: 1.2 10*3/uL — ABNORMAL HIGH (ref 0.1–1.0)
Monocytes Relative: 6 %
Neutro Abs: 7.2 10*3/uL (ref 1.7–7.7)
Neutrophils Relative %: 39 %
Platelet Count: 270 10*3/uL (ref 150–400)
RBC: 4.56 MIL/uL (ref 4.22–5.81)
RDW: 15.4 % (ref 11.5–15.5)
WBC Count: 18.5 10*3/uL — ABNORMAL HIGH (ref 4.0–10.5)
nRBC: 0 % (ref 0.0–0.2)

## 2021-07-06 LAB — LACTATE DEHYDROGENASE: LDH: 146 U/L (ref 98–192)

## 2021-07-06 MED ORDER — SODIUM CHLORIDE 0.9 % IV SOLN: Freq: Once | INTRAVENOUS | Status: AC

## 2021-07-06 MED ORDER — METHYLPREDNISOLONE SODIUM SUCC 125 MG IJ SOLR
80.0000 mg | Freq: Every day | INTRAMUSCULAR | Status: DC
  Administered 2021-07-06: 80 mg via INTRAVENOUS
  Filled 2021-07-06: qty 2

## 2021-07-06 MED ORDER — FAMOTIDINE 20 MG IN NS 100 ML IVPB
20.0000 mg | Freq: Once | INTRAVENOUS | Status: AC
  Administered 2021-07-06: 20 mg via INTRAVENOUS
  Filled 2021-07-06: qty 100

## 2021-07-06 MED ORDER — SODIUM CHLORIDE 0.9 % IV SOLN
375.0000 mg/m2 | Freq: Once | INTRAVENOUS | Status: AC
  Administered 2021-07-06: 600 mg via INTRAVENOUS
  Filled 2021-07-06: qty 10

## 2021-07-06 MED ORDER — DIPHENHYDRAMINE HCL 25 MG PO CAPS
50.0000 mg | ORAL_CAPSULE | Freq: Once | ORAL | Status: AC
  Administered 2021-07-06: 50 mg via ORAL
  Filled 2021-07-06: qty 2

## 2021-07-06 MED ORDER — ACETAMINOPHEN 500 MG PO TABS
1000.0000 mg | ORAL_TABLET | Freq: Once | ORAL | Status: AC
  Administered 2021-07-06: 1000 mg via ORAL
  Filled 2021-07-06: qty 2

## 2021-07-06 NOTE — Progress Notes (Signed)
HEMATOLOGY/ONCOLOGY CLNIC NOTE  Date of Service: 07/06/2021  Patient Care Team: Lattie Haw, MD as PCP - General (Family Medicine)  Melissa Montane, MD as ENT  CHIEF COMPLAINTS/PURPOSE OF CONSULTATION:  Non-Hodgkin's Lymphoma   CURRENT TREATMENT: Due to start cycle 1 of Rituximab monotherapy.   INTERVAL HISTORY:  Alec York returns today for management and evaluation of his Non-Hodgkin's Lymphoma.  He is scheduled to start rituximab monotherapy today.  Patient is accompanied by his wife for this visit.  He reports his energy levels are overall stable.  He has any gait abnormalities and requires a cane for ambulation.  He denies any recent falls.  Patient has stable appetite without any noticeable weight changes recently.  He denies any nausea, vomiting or abdominal pain.  His bowel movements are regular without any constipation or diarrhea.  He denies easy bruising or signs of bleeding.  Patient denies shortness of breath mainly with exertion but not at rest.  He denies any fevers, chills, night sweats, chest pain, cough.  He has no other complaints.  MEDICAL HISTORY:  Past Medical History:  Diagnosis Date   Allergy    Coronary artery disease    Diabetes mellitus without complication (HCC)    Diabetic retinopathy ([place])    Disabled 2/2 retinopathy   GERD (gastroesophageal reflux disease)    Hyperlipidemia    Hypertension     SURGICAL HISTORY: Past Surgical History:  Procedure Laterality Date   CARDIAC CATHETERIZATION     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   CABG x 4 [place]    SOCIAL HISTORY: Social History   Socioeconomic History   Marital status: Married    Spouse name: Not on file   Number of children: 2   Years of education: Not on file   Highest education level: Not on file  Occupational History   Occupation: Retired  Tobacco Use   Smoking status: Former    Packs/day: 0.50    Years: 10.00    Pack years: 5.00    Types: Cigarettes    Start date:  1971    Quit date: 2010    Years since quitting: 12.6   Smokeless tobacco: Never  Vaping Use   Vaping Use: Never used  Substance and Sexual Activity   Alcohol use: No    Alcohol/week: 0.0 standard drinks   Drug use: No   Sexual activity: Yes    Birth control/protection: None  Other Topics Concern   Not on file  Social History Narrative   Not on file   Social Determinants of Health   Financial Resource Strain: Not on file  Food Insecurity: Not on file  Transportation Needs: Not on file  Physical Activity: Not on file  Stress: Not on file  Social Connections: Not on file  Intimate Partner Violence: Not on file    FAMILY HISTORY: Family History  Problem Relation Age of Onset   Heart disease Mother    Diabetes Neg Hx     ALLERGIES:  has No Known Allergies.  MEDICATIONS:  Current Outpatient Medications  Medication Sig Dispense Refill   Accu-Chek FastClix Lancets MISC Use to test sugars up to 4 times daily.  Dx Code: E11.319 102 each 12   Accu-Chek Softclix Lancets lancets Use as instructed 100 each 12   aspirin EC 81 MG tablet Take 1 tablet (81 mg total) by mouth daily. Swallow whole. 90 tablet 3   blood glucose meter kit and supplies KIT Dispense based on patient and  insurance preference. Use up to four times daily as directed. Dx E11.319 1 each 0   Blood Glucose Monitoring Suppl (ACCU-CHEK GUIDE) w/Device KIT 1 each by Does not apply route 4 (four) times daily as needed. Use to test sugars up to 4 times daily.  Dx Code: E11.319 1 kit 0   Blood Pressure Monitoring (BLOOD PRESSURE CUFF) MISC 1 Units by Does not apply route daily. 1 each 0   empagliflozin (JARDIANCE) 25 MG TABS tablet Take 1 tablet (25 mg total) by mouth daily. 90 tablet 1   glucose blood (ACCU-CHEK GUIDE) test strip Use to test sugars up to 4 times daily.  Dx Code: E11.319 200 each 12   insulin degludec (TRESIBA) 100 UNIT/ML FlexTouch Pen Inject 10 Units into the skin daily. 15 mL 0   Insulin Pen Needle  (RELION PEN NEEDLES) 31G X 6 MM MISC USE AS DIRECTED TO  CHECK  BLOOD  SUGAR 50 each 23   iron polysaccharides (NIFEREX) 150 MG capsule Take 1 capsule (150 mg total) by mouth daily. 30 capsule 5   Lancet Devices (ACCU-CHEK SOFTCLIX) lancets Use as instructed up to 4 times daily.  Dx E11.319 400 each 3   Lancets Misc. (ACCU-CHEK FASTCLIX LANCET) KIT Use to test sugars up to 4 times daily.  Dx Code: E11.319 1 kit 0   metFORMIN (GLUCOPHAGE) 1000 MG tablet Take 1 tablet (1,000 mg total) by mouth 2 (two) times daily with a meal. 180 tablet 1   metoprolol succinate (TOPROL-XL) 50 MG 24 hr tablet Take 1 tablet (50 mg total) by mouth at bedtime. Take with or immediately following a meal. 90 tablet 3   nitroGLYCERIN (NITROSTAT) 0.4 MG SL tablet Place 1 tablet (0.4 mg total) under the tongue every 5 (five) minutes as needed for chest pain. 30 tablet 12   rosuvastatin (CRESTOR) 10 MG tablet Take 1 tablet (10 mg total) by mouth daily. 90 tablet 3   TRULICITY 9.62 IW/9.7LG SOPN INJECT 0.75 MG INTO THE SKIN ONCE A WEEK 4 mL 0   vitamin B-12 (CYANOCOBALAMIN) 500 MCG tablet Take 500 mcg by mouth daily.     No current facility-administered medications for this visit.   Facility-Administered Medications Ordered in Other Visits  Medication Dose Route Frequency Provider Last Rate Last Admin   methylPREDNISolone sodium succinate (SOLU-MEDROL) 125 mg/2 mL injection 80 mg  80 mg Intravenous Daily Brunetta Genera, MD   80 mg at 07/06/21 1106    REVIEW OF SYSTEMS:   10 Point review of Systems was done is negative except as noted above   PHYSICAL EXAMINATION: ECOG FS:1 - Symptomatic but completely ambulatory  Vitals:   07/06/21 0947  BP: 114/63  Pulse: 70  Resp: 17  Temp: 97.6 F (36.4 C)  SpO2: 99%     Wt Readings from Last 3 Encounters:  07/06/21 117 lb 11.2 oz (53.4 kg)  06/24/21 117 lb 3.2 oz (53.2 kg)  06/07/21 118 lb 3.2 oz (53.6 kg)   Body mass index is 17.9 kg/m.    Physical  Exam Constitutional:      Appearance: Normal appearance. He is not ill-appearing.  HENT:     Head: Normocephalic and atraumatic.  Eyes:     Extraocular Movements: Extraocular movements intact.     Conjunctiva/sclera: Conjunctivae normal.  Cardiovascular:     Rate and Rhythm: Normal rate and regular rhythm.     Heart sounds: Normal heart sounds.  Pulmonary:     Effort: Pulmonary effort is normal.  Breath sounds: Normal breath sounds.  Abdominal:     General: Bowel sounds are normal.     Palpations: Abdomen is soft.     Tenderness: There is no abdominal tenderness.  Musculoskeletal:     Cervical back: Normal range of motion.     Right lower leg: No edema.     Left lower leg: No edema.  Skin:    General: Skin is warm.     Coloration: Skin is not jaundiced.  Neurological:     General: No focal deficit present.     Mental Status: He is alert and oriented to person, place, and time.      LABORATORY DATA:  I have reviewed the data as listed  . CBC Latest Ref Rng & Units 07/06/2021 06/01/2021 01/12/2021  WBC 4.0 - 10.5 K/uL 18.5(H) 22.4(H) 20.0(H)  Hemoglobin 13.0 - 17.0 g/dL 11.7(L) 12.0(L) 10.9(L)  Hematocrit 39.0 - 52.0 % 36.6(L) 37.7(L) 34.6(L)  Platelets 150 - 400 K/uL 270 287 216    . CMP Latest Ref Rng & Units 07/06/2021 06/07/2021 06/01/2021  Glucose 70 - 99 mg/dL 244(H) - 159(H)  BUN 8 - 23 mg/dL 14 - 12  Creatinine 0.61 - 1.24 mg/dL 0.93 - 0.93  Sodium 135 - 145 mmol/L 136 - 138  Potassium 3.5 - 5.1 mmol/L 4.5 - 4.9  Chloride 98 - 111 mmol/L 101 - 103  CO2 22 - 32 mmol/L 24 - 26  Calcium 8.9 - 10.3 mg/dL 9.5 - 9.8  Total Protein 6.5 - 8.1 g/dL 8.0 7.9 8.2(H)  Total Bilirubin 0.3 - 1.2 mg/dL 0.3 - 0.3  Alkaline Phos 38 - 126 U/L 82 - 83  AST 15 - 41 U/L 12(L) - 11(L)  ALT 0 - 44 U/L 10 - 6   . Lab Results  Component Value Date   LDH 146 07/06/2021    06/18/18 Right Cervical LN Needle/core Biopsy:    05/23/18 Fine Needle Aspiration Flow  Cytometry:    RADIOGRAPHIC STUDIES: I have personally reviewed the radiological images as listed and agreed with the findings in the report. DG Chest 2 View  Result Date: 06/07/2021 CLINICAL DATA:  Pleural effusion. EXAM: CHEST - 2 VIEW COMPARISON:  PET CT February 02, 2021. FINDINGS: Small to moderate bilateral pleural effusions. Ill-defined bibasilar opacities. No visible pneumothorax. Cardiomediastinal silhouette is within normal limits. Median sternotomy. No acute osseous abnormality. Polyarticular degenerative change. IMPRESSION: Small to moderate bilateral pleural effusions. Ill-defined bibasilar opacities, which most likely represent loculated pleural fluid and/or atelectasis when correlating with recent PET-CT. Infection is not excluded. Given the somewhat masslike appearance of the bibasilar opacities and the patient's known lymphoma, close interval follow-up imaging is recommended to exclude malignancy. Alternatively, chest CT (preferably with contrast) could further characterize. Electronically Signed   By: Margaretha Sheffield MD   On: 06/07/2021 11:04   VAS [place] LE ART SEG MULTI (Segm&LE Reynauds)  Result Date: 06/14/2021  LOWER EXTREMITY DOPPLER STUDY Patient Name:  JAYMESON MENGEL  Date of Exam:   06/14/2021 Medical Rec #: [id number]          Accession #:    [id number] Date of Birth: [birth year]          Patient Gender: M Patient Age:   70Y Exam Location:  [facility] Procedure:      VAS [place] LOWER EXT ART SEG MULTI (SEGMENTALS & LE RAYNAUDS) Referring Phys: [id number] [place] --------------------------------------------------------------------------------  Indications: Peripheral artery disease. High Risk Factors: Hypertension, hyperlipidemia, Diabetes, past history of  smoking, prior MI, coronary artery disease. Other Factors: Patient complains of bilateral leg pain when he starts walking;                he denies true claudication symptoms. He also complains of                 numbness and tingling in his feet.  Performing Technologist: Wilkie Aye RVT  Examination Guidelines: A complete evaluation includes at minimum, Doppler waveform signals and systolic blood pressure reading at the level of bilateral brachial, anterior tibial, and posterior tibial arteries, when vessel segments are accessible. Bilateral testing is considered an integral part of a complete examination. Photoelectric Plethysmograph (PPG) waveforms and toe systolic pressure readings are included as required and additional duplex testing as needed. Limited examinations for reoccurring indications may be performed as noted.  ABI Findings: +---------+------------------+-----+---------+--------+ Right    Rt Pressure (mmHg)IndexWaveform Comment  +---------+------------------+-----+---------+--------+ Brachial 138                                      +---------+------------------+-----+---------+--------+ CFA                             triphasic         +---------+------------------+-----+---------+--------+ Popliteal                       triphasic         +---------+------------------+-----+---------+--------+ ATA      160               1.16 biphasic          +---------+------------------+-----+---------+--------+ PTA      162               1.17 triphasic         +---------+------------------+-----+---------+--------+ PERO     156               1.13 triphasic         +---------+------------------+-----+---------+--------+ Great Toe144               1.04 Normal            +---------+------------------+-----+---------+--------+ +---------+------------------+-----+---------+-------+ Left     Lt Pressure (mmHg)IndexWaveform Comment +---------+------------------+-----+---------+-------+ Brachial 135                                     +---------+------------------+-----+---------+-------+ CFA                             triphasic         +---------+------------------+-----+---------+-------+ Popliteal                       triphasic        +---------+------------------+-----+---------+-------+ ATA      156               1.13 triphasic        +---------+------------------+-----+---------+-------+ PTA      170               1.23 triphasic        +---------+------------------+-----+---------+-------+ PERO     149               1.08  triphasic        +---------+------------------+-----+---------+-------+ Great Toe124               0.90 Normal           +---------+------------------+-----+---------+-------+ +-------+-----------+-----------+ ABI/TBIToday's ABIToday's TBI +-------+-----------+-----------+ Right  1.17       1.04        +-------+-----------+-----------+ Left   1.23       0.90        +-------+-----------+-----------+   Summary: Right: Resting right ankle-brachial index is within normal range. No evidence of significant right lower extremity arterial disease. The right toe-brachial index is normal. Left: Resting left ankle-brachial index is within normal range. No evidence of significant left lower extremity arterial disease. The left toe-brachial index is normal.  *See table(s) above for measurements and observations.  Electronically signed by Quay Burow MD on 06/14/2021 at 5:48:07 PM.    Final     ASSESSMENT & PLAN:   70 y.o. male with  1. 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 Benign behavior is not established. Bulky adenopathy such as this could relate to lymphoma, or metastatic squamous cell carcinoma. There is no visible  extranodal spread of tumor.  -06/18/18 biopsy favored a low grade Marginal Zone Lymphoma -06/15/18 PET/CT revealed Enlarged and hypermetabolic lymph nodes involving the neck, axilla, subpectoral and mediastinal lymph nodes. No lymphadenopathy below the diaphragm. No findings for osseous lymphoma -06/07/18 ECHO for treatment planning revealed a normal ejection fraction -02/02/21 PET/CT showed unchanged mediastinal lymphadenopathy with similar hypermetabolic activity. Interval development of moderate right and small left pleural effusion R > L.  -06/07/2021 Underwent thoracentesis with removal of 200 ml of straw colored fluid. Cytology revealed predominantly lymphocytic (95%) with 8-9% B-cells present. This is suggestive of involvement of pleural fluid by lymphoma.  -Presents today, 07/06/2021, to start Cycle1 of Rituximab Monotherapy (weekly for a total of 4 doses)  PLAN: -Labs from today were reviewed. WBC has improved from 22.4 to 18.5. Hgb is stable at 11.7. Plt 270.  -Glucose level is 244, patient reports that his fasting blood glucose was 120 and took insulin this morning. He will continue to monitor. -Patient will proceed with cycle 1 of Rituximab today.   -RTC next week on 07/13/2021 for office visit with Dr. Kasai Beltran Limbo, labs and cycle 2 of Rituximab.     All of the patient's questions were answered with apparent satisfaction. The patient knows to call the clinic with any problems, questions or concerns.  I have spent a total of 25 minutes minutes of face-to-face and non-face-to-face time, preparing to see the patient, performing a medically appropriate examination, counseling and educating the patient,  documenting clinical information in the electronic health record, and care coordination.   Lincoln Brigham, PA-C Hematology and [place] at [facility]

## 2021-07-06 NOTE — Progress Notes (Signed)
Pt is approved for the $1000 [employer] grant.  

## 2021-07-06 NOTE — Patient Instructions (Signed)
[facility]  Discharge Instructions: Thank you for choosing [facility] to provide your oncology and hematology care.   If you have a lab appointment with the Cancer Center, please go directly to the Cancer Center and check in at the registration area.   Wear comfortable clothing and clothing appropriate for easy access to any Portacath or PICC line.   We strive to give you quality time with your provider. You may need to reschedule your appointment if you arrive late (15 or more minutes).  Arriving late affects you and other patients whose appointments are after yours.  Also, if you miss three or more appointments without notifying the office, you may be dismissed from the clinic at the provider's discretion.      For prescription refill requests, have your pharmacy contact our office and allow 72 hours for refills to be completed.    Today you received the following chemotherapy and/or immunotherapy agents rituxan      To help prevent nausea and vomiting after your treatment, we encourage you to take your nausea medication as directed.  BELOW ARE SYMPTOMS THAT SHOULD BE REPORTED IMMEDIATELY: *FEVER GREATER THAN 100.4 F (38 C) OR HIGHER *CHILLS OR SWEATING *NAUSEA AND VOMITING THAT IS NOT CONTROLLED WITH YOUR NAUSEA MEDICATION *UNUSUAL SHORTNESS OF BREATH *UNUSUAL BRUISING OR BLEEDING *URINARY PROBLEMS (pain or burning when urinating, or frequent urination) *BOWEL PROBLEMS (unusual diarrhea, constipation, pain near the anus) TENDERNESS IN MOUTH AND THROAT WITH OR WITHOUT PRESENCE OF ULCERS (sore throat, sores in mouth, or a toothache) UNUSUAL RASH, SWELLING OR PAIN  UNUSUAL VAGINAL DISCHARGE OR ITCHING   Items with * indicate a potential emergency and should be followed up as soon as possible or go to the Emergency Department if any problems should occur.  Please show the CHEMOTHERAPY ALERT CARD or IMMUNOTHERAPY ALERT CARD at check-in to the  Emergency Department and triage nurse.  Should you have questions after your visit or need to cancel or reschedule your appointment, please contact [facility]  Dept: [phone]  and follow the prompts.  Office hours are 8:00 a.m. to 4:30 p.m. Monday - Friday. Please note that voicemails left after 4:00 p.m. may not be returned until the following business day.  We are closed weekends and major holidays. You have access to a nurse at all times for urgent questions. Please call the main number to the clinic Dept: [phone] and follow the prompts.   For any non-urgent questions, you may also contact your provider using MyChart. We now offer e-Visits for anyone 18 and older to request care online for non-urgent symptoms. For details visit [email or web address].   Also download the MyChart app! Go to the app store, search "MyChart", open the app, select [facility], and log in with your MyChart username and password.  Due to Covid, a mask is required upon entering the hospital/clinic. If you do not have a mask, one will be given to you upon arrival. For doctor visits, patients may have 1 support person aged 18 or older with them. For treatment visits, patients cannot have anyone with them due to current Covid guidelines and our immunocompromised population.   

## 2021-07-08 ENCOUNTER — Ambulatory Visit: Payer: 59 | Admitting: Pharmacist

## 2021-07-12 ENCOUNTER — Other Ambulatory Visit: Payer: Self-pay

## 2021-07-12 DIAGNOSIS — C8590 Non-Hodgkin lymphoma, unspecified, unspecified site: Secondary | ICD-10-CM

## 2021-07-13 ENCOUNTER — Other Ambulatory Visit: Payer: Self-pay | Admitting: Hematology

## 2021-07-13 ENCOUNTER — Inpatient Hospital Stay: Payer: 59

## 2021-07-13 ENCOUNTER — Other Ambulatory Visit: Payer: Self-pay

## 2021-07-13 ENCOUNTER — Inpatient Hospital Stay (HOSPITAL_BASED_OUTPATIENT_CLINIC_OR_DEPARTMENT_OTHER): Payer: 59 | Admitting: Hematology

## 2021-07-13 VITALS — BP 116/57 | HR 69 | Temp 97.9°F | Resp 18 | Wt 118.2 lb

## 2021-07-13 VITALS — BP 119/60 | HR 67 | Temp 98.2°F | Resp 18

## 2021-07-13 DIAGNOSIS — Z87891 Personal history of nicotine dependence: Secondary | ICD-10-CM | POA: Diagnosis not present

## 2021-07-13 DIAGNOSIS — C8590 Non-Hodgkin lymphoma, unspecified, unspecified site: Secondary | ICD-10-CM

## 2021-07-13 DIAGNOSIS — Z5112 Encounter for antineoplastic immunotherapy: Secondary | ICD-10-CM | POA: Diagnosis present

## 2021-07-13 DIAGNOSIS — Z298 Encounter for other specified prophylactic measures: Secondary | ICD-10-CM | POA: Diagnosis not present

## 2021-07-13 DIAGNOSIS — J90 Pleural effusion, not elsewhere classified: Secondary | ICD-10-CM | POA: Diagnosis not present

## 2021-07-13 DIAGNOSIS — C8308 Small cell B-cell lymphoma, lymph nodes of multiple sites: Secondary | ICD-10-CM | POA: Diagnosis not present

## 2021-07-13 DIAGNOSIS — D84821 Immunodeficiency due to drugs: Secondary | ICD-10-CM | POA: Diagnosis not present

## 2021-07-13 LAB — CBC WITH DIFFERENTIAL (CANCER CENTER ONLY)
Abs Immature Granulocytes: 0.02 10*3/uL (ref 0.00–0.07)
Basophils Absolute: 0.1 10*3/uL (ref 0.0–0.1)
Basophils Relative: 1 %
Eosinophils Absolute: 0.1 10*3/uL (ref 0.0–0.5)
Eosinophils Relative: 2 %
HCT: 37.5 % — ABNORMAL LOW (ref 39.0–52.0)
Hemoglobin: 11.8 g/dL — ABNORMAL LOW (ref 13.0–17.0)
Immature Granulocytes: 0 %
Lymphocytes Relative: 12 %
Lymphs Abs: 1 10*3/uL (ref 0.7–4.0)
MCH: 25.1 pg — ABNORMAL LOW (ref 26.0–34.0)
MCHC: 31.5 g/dL (ref 30.0–36.0)
MCV: 79.6 fL — ABNORMAL LOW (ref 80.0–100.0)
Monocytes Absolute: 0.9 10*3/uL (ref 0.1–1.0)
Monocytes Relative: 11 %
Neutro Abs: 6.2 10*3/uL (ref 1.7–7.7)
Neutrophils Relative %: 74 %
Platelet Count: 278 10*3/uL (ref 150–400)
RBC: 4.71 MIL/uL (ref 4.22–5.81)
RDW: 14.9 % (ref 11.5–15.5)
WBC Count: 8.3 10*3/uL (ref 4.0–10.5)
nRBC: 0 % (ref 0.0–0.2)

## 2021-07-13 LAB — CMP (CANCER CENTER ONLY)
ALT: 9 U/L (ref 0–44)
AST: 11 U/L — ABNORMAL LOW (ref 15–41)
Albumin: 3.4 g/dL — ABNORMAL LOW (ref 3.5–5.0)
Alkaline Phosphatase: 95 U/L (ref 38–126)
Anion gap: 12 (ref 5–15)
BUN: 12 mg/dL (ref 8–23)
CO2: 25 mmol/L (ref 22–32)
Calcium: 9.4 mg/dL (ref 8.9–10.3)
Chloride: 101 mmol/L (ref 98–111)
Creatinine: 0.94 mg/dL (ref 0.61–1.24)
GFR, Estimated: >60 mL/min (ref >60)
Glucose, Bld: 226 mg/dL — ABNORMAL HIGH (ref 70–99)
Potassium: 4.6 mmol/L (ref 3.5–5.1)
Sodium: 138 mmol/L (ref 135–145)
Total Bilirubin: 0.3 mg/dL (ref 0.3–1.2)
Total Protein: 7.8 g/dL (ref 6.5–8.1)

## 2021-07-13 LAB — LACTATE DEHYDROGENASE: LDH: 179 U/L (ref 98–192)

## 2021-07-13 MED ORDER — FAMOTIDINE 20 MG IN NS 100 ML IVPB
20.0000 mg | Freq: Once | INTRAVENOUS | Status: AC
Start: 2021-07-13 — End: 2021-07-13
  Administered 2021-07-13: 20 mg via INTRAVENOUS
  Filled 2021-07-13: qty 100

## 2021-07-13 MED ORDER — SODIUM CHLORIDE 0.9 % IV SOLN: 375.0000 mg/m2 | Freq: Once | INTRAVENOUS | Status: DC

## 2021-07-13 MED ORDER — DIPHENHYDRAMINE HCL 25 MG PO CAPS
50.0000 mg | ORAL_CAPSULE | Freq: Once | ORAL | Status: AC
  Administered 2021-07-13: 50 mg via ORAL
  Filled 2021-07-13: qty 2

## 2021-07-13 MED ORDER — ACETAMINOPHEN 500 MG PO TABS
1000.0000 mg | ORAL_TABLET | Freq: Once | ORAL | Status: AC
  Administered 2021-07-13: 1000 mg via ORAL
  Filled 2021-07-13: qty 2

## 2021-07-13 MED ORDER — DIPHENHYDRAMINE HCL 50 MG/ML IJ SOLN: 50.0000 mg | Freq: Once | INTRAMUSCULAR | Status: DC | PRN

## 2021-07-13 MED ORDER — METHYLPREDNISOLONE SODIUM SUCC 125 MG IJ SOLR: 125.0000 mg | Freq: Once | INTRAMUSCULAR | Status: DC | PRN

## 2021-07-13 MED ORDER — CILGAVIMAB (PART OF EVUSHELD) INJECTION: 300.0000 mg | Freq: Once | INTRAMUSCULAR | Status: DC

## 2021-07-13 MED ORDER — CILGAVIMAB (PART OF EVUSHELD) INJECTION
300.0000 mg | Freq: Once | INTRAMUSCULAR | Status: AC
  Administered 2021-07-13: 300 mg via INTRAMUSCULAR

## 2021-07-13 MED ORDER — TIXAGEVIMAB (PART OF EVUSHELD) INJECTION: 300.0000 mg | Freq: Once | INTRAMUSCULAR | Status: DC

## 2021-07-13 MED ORDER — TIXAGEVIMAB (PART OF EVUSHELD) INJECTION
300.0000 mg | Freq: Once | INTRAMUSCULAR | Status: AC
  Administered 2021-07-13: 300 mg via INTRAMUSCULAR

## 2021-07-13 MED ORDER — SODIUM CHLORIDE 0.9 % IV SOLN
375.0000 mg/m2 | Freq: Once | INTRAVENOUS | Status: AC
  Administered 2021-07-13: 600 mg via INTRAVENOUS
  Filled 2021-07-13: qty 50

## 2021-07-13 MED ORDER — METHYLPREDNISOLONE SODIUM SUCC 125 MG IJ SOLR
80.0000 mg | Freq: Every day | INTRAMUSCULAR | Status: DC
  Administered 2021-07-13: 80 mg via INTRAVENOUS
  Filled 2021-07-13: qty 2

## 2021-07-13 MED ORDER — EPINEPHRINE 0.3 MG/0.3ML IJ SOAJ: 0.3000 mg | Freq: Once | INTRAMUSCULAR | Status: DC | PRN

## 2021-07-13 MED ORDER — ALBUTEROL SULFATE HFA 108 (90 BASE) MCG/ACT IN AERS: 2.0000 | INHALATION_SPRAY | Freq: Once | RESPIRATORY_TRACT | Status: DC | PRN

## 2021-07-13 MED ORDER — SODIUM CHLORIDE 0.9 % IV SOLN: Freq: Once | INTRAVENOUS | Status: AC

## 2021-07-13 NOTE — Patient Instructions (Signed)
[facility]   Discharge Instructions: Thank you for choosing [facility] to provide your oncology and hematology care.   If you have a lab appointment with the [facility], please go directly to the [facility] and check in at the registration area.   Wear comfortable clothing and clothing appropriate for easy access to any Portacath or PICC line.   We strive to give you quality time with your provider. You may need to reschedule your appointment if you arrive late (15 or more minutes).  Arriving late affects you and other patients whose appointments are after yours.  Also, if you miss three or more appointments without notifying the office, you may be dismissed from the clinic at the provider's discretion.      For prescription refill requests, have your pharmacy contact our office and allow 72 hours for refills to be completed.    Today you received the following chemotherapy and/or immunotherapy agents: rituximab.      To help prevent nausea and vomiting after your treatment, we encourage you to take your nausea medication as directed.  BELOW ARE SYMPTOMS THAT SHOULD BE REPORTED IMMEDIATELY: *FEVER GREATER THAN 100.4 F (38 C) OR HIGHER *CHILLS OR SWEATING *NAUSEA AND VOMITING THAT IS NOT CONTROLLED WITH YOUR NAUSEA MEDICATION *UNUSUAL SHORTNESS OF BREATH *UNUSUAL BRUISING OR BLEEDING *URINARY PROBLEMS (pain or burning when urinating, or frequent urination) *BOWEL PROBLEMS (unusual diarrhea, constipation, pain near the anus) TENDERNESS IN MOUTH AND THROAT WITH OR WITHOUT PRESENCE OF ULCERS (sore throat, sores in mouth, or a toothache) UNUSUAL RASH, SWELLING OR PAIN  UNUSUAL VAGINAL DISCHARGE OR ITCHING   Items with * indicate a potential emergency and should be followed up as soon as possible or go to the Emergency Department if any problems should occur.  Please show the CHEMOTHERAPY ALERT CARD or IMMUNOTHERAPY ALERT CARD at check-in  to the Emergency Department and triage nurse.  Should you have questions after your visit or need to cancel or reschedule your appointment, please contact [facility]  Dept: [phone]  and follow the prompts.  Office hours are 8:00 a.m. to 4:30 p.m. Monday - Friday. Please note that voicemails left after 4:00 p.m. may not be returned until the following business day.  We are closed weekends and major holidays. You have access to a nurse at all times for urgent questions. Please call the main number to the clinic Dept: [phone] and follow the prompts.   For any non-urgent questions, you may also contact your provider using MyChart. We now offer e-Visits for anyone 72 and older to request care online for non-urgent symptoms. For details visit [email or web address].   Also download the MyChart app! Go to the app store, search "MyChart", open the app, select , and log in with your MyChart username and password.  Due to Covid, a mask is required upon entering the hospital/clinic. If you do not have a mask, one will be given to you upon arrival. For doctor visits, patients may have 1 support person aged 68 or older with them. For treatment visits, patients cannot have anyone with them due to current Covid guidelines and our immunocompromised population.

## 2021-07-19 ENCOUNTER — Encounter: Payer: Self-pay | Admitting: Hematology

## 2021-07-19 NOTE — Progress Notes (Signed)
HEMATOLOGY/ONCOLOGY CLNIC NOTE  Date of Service: .07/13/2021   Patient Care Team: Towanda Octave, MD as PCP - General (Family Medicine)  Suzanna Obey, MD as ENT  CHIEF COMPLAINTS/PURPOSE OF CONSULTATION:  Non-Hodgkin's Lymphoma   CURRENT TREATMENT: Due to start cycle 1 of Rituximab monotherapy.   INTERVAL HISTORY:   Alec York returns today for management and evaluation of his Non-Hodgkin's Lymphoma and for toxicity check prior to second weekly dose of rituximab for his nodal marginal zone lymphoma.  Patient notes no significant toxicity after his first dose of treatment.  He continues to have significant anxiety and notes some insomnia. No nausea no vomiting no diarrhea. No overt hypersensitivity reaction to Rituxan. Again discussed importance of continue to follow with his primary care physician to optimize diabetes treatment. Discussed and patient agreeable to getting Evusheld today. Labs reviewed with the patient lymphocytosis improved other labs stable.  MEDICAL HISTORY:  Past Medical History:  Diagnosis Date   Allergy    Coronary artery disease    Diabetes mellitus without complication (HCC)    Diabetic retinopathy (HCC)    Disabled 2/2 retinopathy   GERD (gastroesophageal reflux disease)    Hyperlipidemia    Hypertension     SURGICAL HISTORY: Past Surgical History:  Procedure Laterality Date   CARDIAC CATHETERIZATION     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   CABG x 4 [place]    SOCIAL HISTORY: Social History   Socioeconomic History   Marital status: Married    Spouse name: Not on file   Number of children: 2   Years of education: Not on file   Highest education level: Not on file  Occupational History   Occupation: Retired  Tobacco Use   Smoking status: Former    Packs/day: 0.50    Years: 10.00    Pack years: 5.00    Types: Cigarettes    Start date: 1971    Quit date: 2010    Years since quitting: 12.6   Smokeless tobacco: Never   Vaping Use   Vaping Use: Never used  Substance and Sexual Activity   Alcohol use: No    Alcohol/week: 0.0 standard drinks   Drug use: No   Sexual activity: Yes    Birth control/protection: None  Other Topics Concern   Not on file  Social History Narrative   Not on file   Social Determinants of Health   Financial Resource Strain: Not on file  Food Insecurity: Not on file  Transportation Needs: Not on file  Physical Activity: Not on file  Stress: Not on file  Social Connections: Not on file  Intimate Partner Violence: Not on file    FAMILY HISTORY: Family History  Problem Relation Age of Onset   Heart disease Mother    Diabetes Neg Hx     ALLERGIES:  has No Known Allergies.  MEDICATIONS:  Current Outpatient Medications  Medication Sig Dispense Refill   Accu-Chek FastClix Lancets MISC Use to test sugars up to 4 times daily.  Dx Code: E11.319 102 each 12   Accu-Chek Softclix Lancets lancets Use as instructed 100 each 12   aspirin EC 81 MG tablet Take 1 tablet (81 mg total) by mouth daily. Swallow whole. 90 tablet 3   blood glucose meter kit and supplies KIT Dispense based on patient and insurance preference. Use up to four times daily as directed. Dx E11.319 1 each 0   Blood Glucose Monitoring Suppl (ACCU-CHEK GUIDE) w/Device KIT 1 each by  Does not apply route 4 (four) times daily as needed. Use to test sugars up to 4 times daily.  Dx Code: E11.319 1 kit 0   Blood Pressure Monitoring (BLOOD PRESSURE CUFF) MISC 1 Units by Does not apply route daily. 1 each 0   empagliflozin (JARDIANCE) 25 MG TABS tablet Take 1 tablet (25 mg total) by mouth daily. 90 tablet 1   glucose blood (ACCU-CHEK GUIDE) test strip Use to test sugars up to 4 times daily.  Dx Code: E11.319 200 each 12   insulin degludec (TRESIBA) 100 UNIT/ML FlexTouch Pen Inject 10 Units into the skin daily. 15 mL 0   Insulin Pen Needle (RELION PEN NEEDLES) 31G X 6 MM MISC USE AS DIRECTED TO  CHECK  BLOOD  SUGAR 50 each  23   iron polysaccharides (NIFEREX) 150 MG capsule Take 1 capsule (150 mg total) by mouth daily. 30 capsule 5   Lancet Devices (ACCU-CHEK SOFTCLIX) lancets Use as instructed up to 4 times daily.  Dx E11.319 400 each 3   Lancets Misc. (ACCU-CHEK FASTCLIX LANCET) KIT Use to test sugars up to 4 times daily.  Dx Code: E11.319 1 kit 0   metFORMIN (GLUCOPHAGE) 1000 MG tablet Take 1 tablet (1,000 mg total) by mouth 2 (two) times daily with a meal. 180 tablet 1   metoprolol succinate (TOPROL-XL) 50 MG 24 hr tablet Take 1 tablet (50 mg total) by mouth at bedtime. Take with or immediately following a meal. 90 tablet 3   nitroGLYCERIN (NITROSTAT) 0.4 MG SL tablet Place 1 tablet (0.4 mg total) under the tongue every 5 (five) minutes as needed for chest pain. 30 tablet 12   rosuvastatin (CRESTOR) 10 MG tablet Take 1 tablet (10 mg total) by mouth daily. 90 tablet 3   TRULICITY 1.49 FW/2.6VZ SOPN INJECT 0.75 MG INTO THE SKIN ONCE A WEEK 4 mL 0   vitamin B-12 (CYANOCOBALAMIN) 500 MCG tablet Take 500 mcg by mouth daily.     Current Facility-Administered Medications  Medication Dose Route Frequency Provider Last Rate Last Admin   cilgavimab (part of EVUSHELD) injection SOLN 300 mg  300 mg Intramuscular Once Brunetta Genera, MD       diphenhydrAMINE (BENADRYL) injection 50 mg  50 mg Intramuscular Once PRN Brunetta Genera, MD       tixagevimab (part of EVUSHELD) injection SOLN 300 mg  300 mg Intramuscular Once Brunetta Genera, MD        REVIEW OF SYSTEMS:   .10 Point review of Systems was done is negative except as noted above.   PHYSICAL EXAMINATION: ECOG FS:1 - Symptomatic but completely ambulatory  Vitals:   07/13/21 0959  BP: (!) 116/57  Pulse: 69  Resp: 18  Temp: 97.9 F (36.6 C)  SpO2: 99%     Wt Readings from Last 3 Encounters:  07/13/21 118 lb 4 oz (53.6 kg)  07/06/21 117 lb 11.2 oz (53.4 kg)  06/24/21 117 lb 3.2 oz (53.2 kg)   Body mass index is 17.98 kg/m.    [name] GENERAL:alert, in no acute distress and comfortable SKIN: no acute rashes, no significant lesions EYES: conjunctiva are pink and non-injected, sclera anicteric OROPHARYNX: MMM, no exudates, no oropharyngeal erythema or ulceration NECK: supple, no JVD LYMPH:  no palpable lymphadenopathy in the cervical, axillary or inguinal regions LUNGS: clear to auscultation b/l with normal respiratory effort HEART: regular rate & rhythm ABDOMEN:  normoactive bowel sounds , non tender, not distended. Extremity: no pedal edema PSYCH: alert &  oriented x 3 with fluent speech NEURO: no focal motor/sensory deficits  LABORATORY DATA:  I have reviewed the data as listed  . CBC Latest Ref Rng & Units 07/13/2021 07/06/2021 06/01/2021  WBC 4.0 - 10.5 K/uL 8.3 18.5(H) 22.4(H)  Hemoglobin 13.0 - 17.0 g/dL 11.8(L) 11.7(L) 12.0(L)  Hematocrit 39.0 - 52.0 % 37.5(L) 36.6(L) 37.7(L)  Platelets 150 - 400 K/uL 278 270 287    . CMP Latest Ref Rng & Units 07/13/2021 07/06/2021 06/07/2021  Glucose 70 - 99 mg/dL 226(H) 244(H) -  BUN 8 - 23 mg/dL 12 14 -  Creatinine 0.61 - 1.24 mg/dL 0.94 0.93 -  Sodium 135 - 145 mmol/L 138 136 -  Potassium 3.5 - 5.1 mmol/L 4.6 4.5 -  Chloride 98 - 111 mmol/L 101 101 -  CO2 22 - 32 mmol/L 25 24 -  Calcium 8.9 - 10.3 mg/dL 9.4 9.5 -  Total Protein 6.5 - 8.1 g/dL 7.8 8.0 7.9  Total Bilirubin 0.3 - 1.2 mg/dL 0.3 0.3 -  Alkaline Phos 38 - 126 U/L 95 82 -  AST 15 - 41 U/L 11(L) 12(L) -  ALT 0 - 44 U/L 9 10 -   . Lab Results  Component Value Date   LDH 179 07/13/2021    06/18/18 Right Cervical LN Needle/core Biopsy:    05/23/18 Fine Needle Aspiration Flow Cytometry:    RADIOGRAPHIC STUDIES: I have personally reviewed the radiological images as listed and agreed with the findings in the report. No results found.  ASSESSMENT & PLAN:   70 y.o. male with  1. 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  -05/16/18 Flow  cytometry results which revealed NHL B-Cell lymphoma, and discussed that this is not completely diagnostic  -03/23/18 US Soft Tissue Head/Neck revealed Multiple well-circumscribed neck lesions are identified, consistent with lymph nodes, the largest of which measures 2.1 x 1.5 x 2.3 cm. Benign behavior is not established. Bulky adenopathy such as this could relate to lymphoma, or metastatic squamous cell carcinoma. There is no visible extranodal spread of tumor.  -06/18/18 biopsy favored a low grade Marginal Zone Lymphoma -06/15/18 PET/CT revealed Enlarged and hypermetabolic lymph nodes involving the neck, axilla, subpectoral and mediastinal lymph nodes. No lymphadenopathy below the diaphragm. No findings for osseous lymphoma -06/07/18 ECHO for treatment planning revealed a normal ejection fraction -02/02/21 PET/CT showed unchanged mediastinal lymphadenopathy with similar hypermetabolic activity. Interval development of moderate right and small left pleural effusion R > L.  -06/07/2021 Underwent thoracentesis with removal of 200 ml of straw colored fluid. Cytology revealed predominantly lymphocytic (95%) with 8-9% B-cells present. This is suggestive of involvement of pleural fluid by lymphoma.   PLAN: -Labs from today were reviewed.  WBC count has normalized to 8.3k today hemoglobin stable at 11.8 platelets within normal limits at 278k -CMP stable except blood sugar level of 226 -LDH remains within normal limits at 179 . -Patient has had no hypersensitivity reactions or significant toxicities from his first cycle of weekly rituximab at this time . -Patient is stable and appropriate to proceed with second weekly dose of Rituxan. -We discussed Evusheld and he is agreeable to proceed and he will receive this today. -Counseled regarding his insomnia and anxiety. -Counseled to maintain good follow-up with his primary care physician for optimization of blood sugar control. Follow-up F/u for next 2 Rituxan  treatments as scheduled  . The total time spent in the appointment was 31 minutes and more than  50% was on counseling and direct patient cares ordering and management of 3 times and immunotherapy. Patient knows to call clinic if any other questions or acute concerns arise.   Sullivan Lone MD MS Hematology/Oncology Physician [facility]

## 2021-07-20 ENCOUNTER — Other Ambulatory Visit: Payer: Self-pay

## 2021-07-20 ENCOUNTER — Inpatient Hospital Stay: Payer: 59

## 2021-07-20 ENCOUNTER — Other Ambulatory Visit: Payer: Self-pay | Admitting: Hematology

## 2021-07-20 VITALS — BP 147/60 | HR 73 | Temp 98.4°F | Resp 16 | Wt 118.8 lb

## 2021-07-20 DIAGNOSIS — C8590 Non-Hodgkin lymphoma, unspecified, unspecified site: Secondary | ICD-10-CM

## 2021-07-20 DIAGNOSIS — Z5112 Encounter for antineoplastic immunotherapy: Secondary | ICD-10-CM | POA: Diagnosis not present

## 2021-07-20 LAB — CMP (CANCER CENTER ONLY)
ALT: 9 U/L (ref 0–44)
AST: 11 U/L — ABNORMAL LOW (ref 15–41)
Albumin: 3.5 g/dL (ref 3.5–5.0)
Alkaline Phosphatase: 105 U/L (ref 38–126)
Anion gap: 10 (ref 5–15)
BUN: 17 mg/dL (ref 8–23)
CO2: 25 mmol/L (ref 22–32)
Calcium: 9.3 mg/dL (ref 8.9–10.3)
Chloride: 102 mmol/L (ref 98–111)
Creatinine: 0.94 mg/dL (ref 0.61–1.24)
GFR, Estimated: >60 mL/min (ref >60)
Glucose, Bld: 315 mg/dL — ABNORMAL HIGH (ref 70–99)
Potassium: 4.5 mmol/L (ref 3.5–5.1)
Sodium: 137 mmol/L (ref 135–145)
Total Bilirubin: 0.4 mg/dL (ref 0.3–1.2)
Total Protein: 7.3 g/dL (ref 6.5–8.1)

## 2021-07-20 LAB — CBC WITH DIFFERENTIAL (CANCER CENTER ONLY)
Abs Immature Granulocytes: 0.04 10*3/uL (ref 0.00–0.07)
Basophils Absolute: 0.1 10*3/uL (ref 0.0–0.1)
Basophils Relative: 1 %
Eosinophils Absolute: 0.2 10*3/uL (ref 0.0–0.5)
Eosinophils Relative: 2 %
HCT: 34.2 % — ABNORMAL LOW (ref 39.0–52.0)
Hemoglobin: 11.1 g/dL — ABNORMAL LOW (ref 13.0–17.0)
Immature Granulocytes: 0 %
Lymphocytes Relative: 10 %
Lymphs Abs: 0.9 10*3/uL (ref 0.7–4.0)
MCH: 25.4 pg — ABNORMAL LOW (ref 26.0–34.0)
MCHC: 32.5 g/dL (ref 30.0–36.0)
MCV: 78.3 fL — ABNORMAL LOW (ref 80.0–100.0)
Monocytes Absolute: 0.7 10*3/uL (ref 0.1–1.0)
Monocytes Relative: 8 %
Neutro Abs: 7.3 10*3/uL (ref 1.7–7.7)
Neutrophils Relative %: 79 %
Platelet Count: 314 10*3/uL (ref 150–400)
RBC: 4.37 MIL/uL (ref 4.22–5.81)
RDW: 15.4 % (ref 11.5–15.5)
WBC Count: 9.3 10*3/uL (ref 4.0–10.5)
nRBC: 0 % (ref 0.0–0.2)

## 2021-07-20 LAB — LACTATE DEHYDROGENASE: LDH: 149 U/L (ref 98–192)

## 2021-07-20 MED ORDER — METHYLPREDNISOLONE SODIUM SUCC 125 MG IJ SOLR
80.0000 mg | Freq: Every day | INTRAMUSCULAR | Status: DC
  Administered 2021-07-20: 80 mg via INTRAVENOUS
  Filled 2021-07-20: qty 2

## 2021-07-20 MED ORDER — SODIUM CHLORIDE 0.9 % IV SOLN
375.0000 mg/m2 | Freq: Once | INTRAVENOUS | Status: AC
  Administered 2021-07-20: 600 mg via INTRAVENOUS
  Filled 2021-07-20: qty 10

## 2021-07-20 MED ORDER — SODIUM CHLORIDE 0.9 % IV SOLN: Freq: Once | INTRAVENOUS | Status: AC

## 2021-07-20 MED ORDER — ACETAMINOPHEN 500 MG PO TABS
1000.0000 mg | ORAL_TABLET | Freq: Once | ORAL | Status: AC
  Administered 2021-07-20: 1000 mg via ORAL
  Filled 2021-07-20: qty 2

## 2021-07-20 MED ORDER — FAMOTIDINE 20 MG IN NS 100 ML IVPB
20.0000 mg | Freq: Once | INTRAVENOUS | Status: AC
  Administered 2021-07-20: 20 mg via INTRAVENOUS
  Filled 2021-07-20: qty 100

## 2021-07-20 MED ORDER — DIPHENHYDRAMINE HCL 25 MG PO CAPS
50.0000 mg | ORAL_CAPSULE | Freq: Once | ORAL | Status: AC
  Administered 2021-07-20: 50 mg via ORAL
  Filled 2021-07-20: qty 2

## 2021-07-20 NOTE — Patient Instructions (Addendum)
[facility]  Discharge Instructions: ?Thank you for choosing [facility] to provide your oncology and hematology care.  ? ?If you have a lab appointment with the Cancer Center, please go directly to the Cancer Center and check in at the registration area. ?  ?Wear comfortable clothing and clothing appropriate for easy access to any Portacath or PICC line.  ? ?We strive to give you quality time with your provider. You may need to reschedule your appointment if you arrive late (15 or more minutes).  Arriving late affects you and other patients whose appointments are after yours.  Also, if you miss three or more appointments without notifying the office, you may be dismissed from the clinic at the provider?s discretion.    ?  ?For prescription refill requests, have your pharmacy contact our office and allow 72 hours for refills to be completed.   ? ?Today you received the following chemotherapy and/or immunotherapy agents: Ruxience ?  ?To help prevent nausea and vomiting after your treatment, we encourage you to take your nausea medication as directed. ? ?BELOW ARE SYMPTOMS THAT SHOULD BE REPORTED IMMEDIATELY: ?*FEVER GREATER THAN 100.4 F (38 ?C) OR HIGHER ?*CHILLS OR SWEATING ?*NAUSEA AND VOMITING THAT IS NOT CONTROLLED WITH YOUR NAUSEA MEDICATION ?*UNUSUAL SHORTNESS OF BREATH ?*UNUSUAL BRUISING OR BLEEDING ?*URINARY PROBLEMS (pain or burning when urinating, or frequent urination) ?*BOWEL PROBLEMS (unusual diarrhea, constipation, pain near the anus) ?TENDERNESS IN MOUTH AND THROAT WITH OR WITHOUT PRESENCE OF ULCERS (sore throat, sores in mouth, or a toothache) ?UNUSUAL RASH, SWELLING OR PAIN  ?UNUSUAL VAGINAL DISCHARGE OR ITCHING  ? ?Items with * indicate a potential emergency and should be followed up as soon as possible or go to the Emergency Department if any problems should occur. ? ?Please show the CHEMOTHERAPY ALERT CARD or IMMUNOTHERAPY ALERT CARD at check-in to the  Emergency Department and triage nurse. ? ?Should you have questions after your visit or need to cancel or reschedule your appointment, please contact [facility]  Dept: [phone]  and follow the prompts.  Office hours are 8:00 a.m. to 4:30 p.m. Monday - Friday. Please note that voicemails left after 4:00 p.m. may not be returned until the following business day.  We are closed weekends and major holidays. You have access to a nurse at all times for urgent questions. Please call the main number to the clinic Dept: [phone] and follow the prompts. ? ? ?For any non-urgent questions, you may also contact your provider using MyChart. We now offer e-Visits for anyone 18 and older to request care online for non-urgent symptoms. For details visit [email or web address]. ?  ?Also download the MyChart app! Go to the app store, search "MyChart", open the app, select , and log in with your MyChart username and password. ? ?Due to Covid, a mask is required upon entering the hospital/clinic. If you do not have a mask, one will be given to you upon arrival. For doctor visits, patients may have 1 support person aged 18 or older with them. For treatment visits, patients cannot have anyone with them due to current Covid guidelines and our immunocompromised population.  ? ?

## 2021-07-23 ENCOUNTER — Other Ambulatory Visit: Payer: Self-pay | Admitting: Hematology

## 2021-07-23 ENCOUNTER — Other Ambulatory Visit: Payer: Self-pay

## 2021-07-23 DIAGNOSIS — C8590 Non-Hodgkin lymphoma, unspecified, unspecified site: Secondary | ICD-10-CM

## 2021-07-23 MED ORDER — LORAZEPAM 0.5 MG PO TABS: 0.5000 mg | ORAL_TABLET | Freq: Every evening | ORAL | 0 refills | Status: DC | PRN

## 2021-07-23 MED ORDER — PANTOPRAZOLE SODIUM 40 MG PO TBEC: 40.0000 mg | DELAYED_RELEASE_TABLET | Freq: Every day | ORAL | 0 refills | Status: DC

## 2021-07-23 MED ORDER — METOCLOPRAMIDE HCL 5 MG PO TABS: 5.0000 mg | ORAL_TABLET | Freq: Three times a day (TID) | ORAL | 0 refills | Status: DC | PRN

## 2021-07-23 NOTE — Progress Notes (Signed)
Patient called with symptoms of significant persistent hiccups and insomnia. He has significant uncontrolled diabetes and probably has an element of gastroparesis. Sent in prescription for metoclopramide 5 mg 3 times daily as needed prior to meals for persistent nausea/vomiting or hiccups. Lorazepam 0.5 mg at bedtime as needed for insomnia. Would also start on a PPI sent a prescription for Protonix  .[place]

## 2021-07-27 ENCOUNTER — Inpatient Hospital Stay: Payer: 59

## 2021-07-27 ENCOUNTER — Inpatient Hospital Stay: Payer: 59 | Attending: Hematology

## 2021-07-27 ENCOUNTER — Inpatient Hospital Stay (HOSPITAL_BASED_OUTPATIENT_CLINIC_OR_DEPARTMENT_OTHER): Payer: 59 | Admitting: Hematology

## 2021-07-27 ENCOUNTER — Other Ambulatory Visit: Payer: Self-pay

## 2021-07-27 VITALS — BP 145/66 | HR 80 | Temp 99.0°F | Resp 20 | Wt 120.0 lb

## 2021-07-27 DIAGNOSIS — Z5112 Encounter for antineoplastic immunotherapy: Secondary | ICD-10-CM | POA: Insufficient documentation

## 2021-07-27 DIAGNOSIS — C8590 Non-Hodgkin lymphoma, unspecified, unspecified site: Secondary | ICD-10-CM

## 2021-07-27 DIAGNOSIS — C8308 Small cell B-cell lymphoma, lymph nodes of multiple sites: Secondary | ICD-10-CM | POA: Diagnosis present

## 2021-07-27 LAB — CBC WITH DIFFERENTIAL (CANCER CENTER ONLY)
Abs Immature Granulocytes: 0.03 10*3/uL (ref 0.00–0.07)
Basophils Absolute: 0.1 10*3/uL (ref 0.0–0.1)
Basophils Relative: 1 %
Eosinophils Absolute: 0.1 10*3/uL (ref 0.0–0.5)
Eosinophils Relative: 1 %
HCT: 38.5 % — ABNORMAL LOW (ref 39.0–52.0)
Hemoglobin: 12.2 g/dL — ABNORMAL LOW (ref 13.0–17.0)
Immature Granulocytes: 0 %
Lymphocytes Relative: 7 %
Lymphs Abs: 0.7 10*3/uL (ref 0.7–4.0)
MCH: 24.8 pg — ABNORMAL LOW (ref 26.0–34.0)
MCHC: 31.7 g/dL (ref 30.0–36.0)
MCV: 78.3 fL — ABNORMAL LOW (ref 80.0–100.0)
Monocytes Absolute: 0.8 10*3/uL (ref 0.1–1.0)
Monocytes Relative: 9 %
Neutro Abs: 7.8 10*3/uL — ABNORMAL HIGH (ref 1.7–7.7)
Neutrophils Relative %: 82 %
Platelet Count: 245 10*3/uL (ref 150–400)
RBC: 4.92 MIL/uL (ref 4.22–5.81)
RDW: 15.6 % — ABNORMAL HIGH (ref 11.5–15.5)
WBC Count: 9.5 10*3/uL (ref 4.0–10.5)
nRBC: 0 % (ref 0.0–0.2)

## 2021-07-27 LAB — CMP (CANCER CENTER ONLY)
ALT: 8 U/L (ref 0–44)
AST: 13 U/L — ABNORMAL LOW (ref 15–41)
Albumin: 3.3 g/dL — ABNORMAL LOW (ref 3.5–5.0)
Alkaline Phosphatase: 85 U/L (ref 38–126)
Anion gap: 14 (ref 5–15)
BUN: 15 mg/dL (ref 8–23)
CO2: 22 mmol/L (ref 22–32)
Calcium: 9.7 mg/dL (ref 8.9–10.3)
Chloride: 100 mmol/L (ref 98–111)
Creatinine: 0.97 mg/dL (ref 0.61–1.24)
GFR, Estimated: >60 mL/min (ref >60)
Glucose, Bld: 283 mg/dL — ABNORMAL HIGH (ref 70–99)
Potassium: 4.5 mmol/L (ref 3.5–5.1)
Sodium: 136 mmol/L (ref 135–145)
Total Bilirubin: 0.4 mg/dL (ref 0.3–1.2)
Total Protein: 7.9 g/dL (ref 6.5–8.1)

## 2021-07-27 LAB — LACTATE DEHYDROGENASE: LDH: 160 U/L (ref 98–192)

## 2021-07-27 MED ORDER — DIPHENHYDRAMINE HCL 25 MG PO CAPS
ORAL_CAPSULE | ORAL | Status: AC
  Filled 2021-07-27: qty 2

## 2021-07-27 MED ORDER — FAMOTIDINE 20 MG IN NS 100 ML IVPB
INTRAVENOUS | Status: AC
  Filled 2021-07-27: qty 100

## 2021-07-27 MED ORDER — FAMOTIDINE 20 MG IN NS 100 ML IVPB
20.0000 mg | Freq: Once | INTRAVENOUS | Status: AC
  Administered 2021-07-27: 20 mg via INTRAVENOUS

## 2021-07-27 MED ORDER — METHYLPREDNISOLONE SODIUM SUCC 125 MG IJ SOLR
80.0000 mg | Freq: Every day | INTRAMUSCULAR | Status: DC
  Administered 2021-07-27: 80 mg via INTRAVENOUS

## 2021-07-27 MED ORDER — ACETAMINOPHEN 500 MG PO TABS
1000.0000 mg | ORAL_TABLET | Freq: Once | ORAL | Status: AC
  Administered 2021-07-27: 1000 mg via ORAL
  Filled 2021-07-27: qty 2

## 2021-07-27 MED ORDER — METHYLPREDNISOLONE SODIUM SUCC 125 MG IJ SOLR
INTRAMUSCULAR | Status: AC
  Filled 2021-07-27: qty 2

## 2021-07-27 MED ORDER — ACETAMINOPHEN 325 MG PO TABS
ORAL_TABLET | ORAL | Status: AC
  Filled 2021-07-27: qty 2

## 2021-07-27 MED ORDER — SODIUM CHLORIDE 0.9 % IV SOLN
375.0000 mg/m2 | Freq: Once | INTRAVENOUS | Status: AC
  Administered 2021-07-27: 600 mg via INTRAVENOUS
  Filled 2021-07-27: qty 10

## 2021-07-27 MED ORDER — DIPHENHYDRAMINE HCL 25 MG PO CAPS
50.0000 mg | ORAL_CAPSULE | Freq: Once | ORAL | Status: AC
  Administered 2021-07-27: 50 mg via ORAL

## 2021-07-27 MED ORDER — SODIUM CHLORIDE 0.9 % IV SOLN: Freq: Once | INTRAVENOUS | Status: AC

## 2021-07-27 NOTE — Patient Instructions (Addendum)
[facility]  Discharge Instructions: Thank you for choosing [facility] to provide your oncology and hematology care.   If you have a lab appointment with the [facility], please go directly to the [facility] and check in at the registration area.   Wear comfortable clothing and clothing appropriate for easy access to any Portacath or PICC line.   We strive to give you quality time with your provider. You may need to reschedule your appointment if you arrive late (15 or more minutes).  Arriving late affects you and other patients whose appointments are after yours.  Also, if you miss three or more appointments without notifying the office, you may be dismissed from the clinic at the provider's discretion.      For prescription refill requests, have your pharmacy contact our office and allow 72 hours for refills to be completed.    Today you received the following chemotherapy and/or immunotherapy agent: Rituxumab   To help prevent nausea and vomiting after your treatment, we encourage you to take your nausea medication as directed.  BELOW ARE SYMPTOMS THAT SHOULD BE REPORTED IMMEDIATELY: *FEVER GREATER THAN 100.4 F (38 C) OR HIGHER *CHILLS OR SWEATING *NAUSEA AND VOMITING THAT IS NOT CONTROLLED WITH YOUR NAUSEA MEDICATION *UNUSUAL SHORTNESS OF BREATH *UNUSUAL BRUISING OR BLEEDING *URINARY PROBLEMS (pain or burning when urinating, or frequent urination) *BOWEL PROBLEMS (unusual diarrhea, constipation, pain near the anus) TENDERNESS IN MOUTH AND THROAT WITH OR WITHOUT PRESENCE OF ULCERS (sore throat, sores in mouth, or a toothache) UNUSUAL RASH, SWELLING OR PAIN  UNUSUAL VAGINAL DISCHARGE OR ITCHING   Items with * indicate a potential emergency and should be followed up as soon as possible or go to the Emergency Department if any problems should occur.  Please show the CHEMOTHERAPY ALERT CARD or IMMUNOTHERAPY ALERT CARD at check-in to the  Emergency Department and triage nurse.  Should you have questions after your visit or need to cancel or reschedule your appointment, please contact [facility]  Dept: [phone]  and follow the prompts.  Office hours are 8:00 a.m. to 4:30 p.m. Monday - Friday. Please note that voicemails left after 4:00 p.m. may not be returned until the following business day.  We are closed weekends and major holidays. You have access to a nurse at all times for urgent questions. Please call the main number to the clinic Dept: [phone] and follow the prompts.   For any non-urgent questions, you may also contact your provider using MyChart. We now offer e-Visits for anyone 64 and older to request care online for non-urgent symptoms. For details visit [email or web address].   Also download the MyChart app! Go to the app store, search "MyChart", open the app, select [facility], and log in with your MyChart username and password.  Due to Covid, a mask is required upon entering the hospital/clinic. If you do not have a mask, one will be given to you upon arrival. For doctor visits, patients may have 1 support person aged 21 or older with them. For treatment visits, patients cannot have anyone with them due to current Covid guidelines and our immunocompromised population.

## 2021-07-27 NOTE — Progress Notes (Signed)
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   Patient notes no acute new symptoms.  No fevers no chills no night sweats.  Cervical lymphadenopathy is decreasing in size. He does have some grade 1 hiccups which have now resolved.  He was given a prescription for Reglan as needed but has not needed to use this.  Was also given a prescription for PPI for reflux.  Grade 1 through 2 insomnia was given a prescription for Ativan as needed. He does not note issues with intermittent anxiety related to the loss of his son and his medical issues including his recent need for CABG and treatment of his lymphoma.  These are not settling down some.  We discussed starting him on an SSRI for chronic generalized anxiety.  He would like to hold off at this time and consider using lorazepam as needed for intermittent anxiety.  He will let us know if the anxiety is affecting his quality of life going ahead.  We discussed follow-up plans to get repeat imaging studies and 7 weeks and follow-up with [place] in 8 weeks with labs. We also discussed the importance of tight control of his diabetes since he seems to be having issues with neuropathy in his lower extremities and also starting in the upper extremities.   MEDICAL HISTORY:  Past Medical History:  Diagnosis Date   Allergy    Coronary artery disease    Diabetes mellitus without complication (HCC)    Diabetic retinopathy ([place])    Disabled 2/2 retinopathy   GERD  (gastroesophageal reflux disease)    Hyperlipidemia    Hypertension     SURGICAL HISTORY: Past Surgical History:  Procedure Laterality Date   CARDIAC CATHETERIZATION     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   CABG x 4 [place]    SOCIAL HISTORY: Social History   Socioeconomic History   Marital status: Married    Spouse name: Not on file   Number of children: 2   Years of education: Not on file   Highest education level: Not on file  Occupational History   Occupation: Retired  Tobacco Use   Smoking status: Former    Packs/day: 0.50    Years: 10.00    Pack years: 5.00    Types: Cigarettes    Start date: 1971    Quit date: 2010    Years since quitting: 12.6   Smokeless tobacco: Never  Vaping Use   Vaping Use: Never used  Substance and Sexual Activity   Alcohol use: No    Alcohol/week: 0.0 standard drinks   Drug use: No   Sexual activity: Yes    Birth control/protection: None  Other Topics Concern   Not on file  Social History Narrative   Not on file   Social Determinants of Health   Financial Resource Strain: Not on file  Food Insecurity: Not on file  Transportation Needs: Not on file  Physical Activity: Not on file  Stress: Not on file  Social Connections: Not on file  Intimate  Partner Violence: Not on file    FAMILY HISTORY: Family History  Problem Relation Age of Onset   Heart disease Mother    Diabetes Neg Hx     ALLERGIES:  has No Known Allergies.  MEDICATIONS:  Current Outpatient Medications  Medication Sig Dispense Refill   Accu-Chek FastClix Lancets MISC Use to test sugars up to 4 times daily.  Dx Code: E11.319 102 each 12   Accu-Chek Softclix Lancets lancets Use as instructed 100 each 12   aspirin EC 81 MG tablet Take 1 tablet (81 mg total) by mouth daily. Swallow whole. 90 tablet 3   blood glucose meter kit and supplies KIT Dispense based on patient and insurance preference. Use up to four times daily as directed. Dx E11.319 1 each 0    Blood Glucose Monitoring Suppl (ACCU-CHEK GUIDE) w/Device KIT 1 each by Does not apply route 4 (four) times daily as needed. Use to test sugars up to 4 times daily.  Dx Code: E11.319 1 kit 0   Blood Pressure Monitoring (BLOOD PRESSURE CUFF) MISC 1 Units by Does not apply route daily. 1 each 0   empagliflozin (JARDIANCE) 25 MG TABS tablet Take 1 tablet (25 mg total) by mouth daily. 90 tablet 1   glucose blood (ACCU-CHEK GUIDE) test strip Use to test sugars up to 4 times daily.  Dx Code: E11.319 200 each 12   insulin degludec (TRESIBA) 100 UNIT/ML FlexTouch Pen Inject 10 Units into the skin daily. 15 mL 0   Insulin Pen Needle (RELION PEN NEEDLES) 31G X 6 MM MISC USE AS DIRECTED TO  CHECK  BLOOD  SUGAR 50 each 23   iron polysaccharides (NIFEREX) 150 MG capsule Take 1 capsule (150 mg total) by mouth daily. 30 capsule 5   Lancet Devices (ACCU-CHEK SOFTCLIX) lancets Use as instructed up to 4 times daily.  Dx E11.319 400 each 3   Lancets Misc. (ACCU-CHEK FASTCLIX LANCET) KIT Use to test sugars up to 4 times daily.  Dx Code: E11.319 1 kit 0   LORazepam (ATIVAN) 0.5 MG tablet Take 1 tablet (0.5 mg total) by mouth at bedtime as needed for anxiety. 20 tablet 0   metFORMIN (GLUCOPHAGE) 1000 MG tablet Take 1 tablet (1,000 mg total) by mouth 2 (two) times daily with a meal. 180 tablet 1   metoCLOPramide (REGLAN) 5 MG tablet Take 1 tablet (5 mg total) by mouth 3 (three) times daily with meals as needed for nausea or vomiting (hiccups). 30 tablet 0   metoprolol succinate (TOPROL-XL) 50 MG 24 hr tablet Take 1 tablet (50 mg total) by mouth at bedtime. Take with or immediately following a meal. 90 tablet 3   nitroGLYCERIN (NITROSTAT) 0.4 MG SL tablet Place 1 tablet (0.4 mg total) under the tongue every 5 (five) minutes as needed for chest pain. 30 tablet 12   pantoprazole (PROTONIX) 40 MG tablet Take 1 tablet (40 mg total) by mouth daily before breakfast. 30 tablet 0   rosuvastatin (CRESTOR) 10 MG tablet Take 1 tablet  (10 mg total) by mouth daily. 90 tablet 3   TRULICITY 4.48 JE/5.6DJ SOPN INJECT 0.75 MG INTO THE SKIN ONCE A WEEK 4 mL 0   vitamin B-12 (CYANOCOBALAMIN) 500 MCG tablet Take 500 mcg by mouth daily.     No current facility-administered medications for this visit.    REVIEW OF SYSTEMS:   .10 Point review of Systems was done is negative except as noted above.  PHYSICAL EXAMINATION: ECOG FS:1 - Symptomatic but  completely ambulatory Vital signs reviewed Wt Readings from Last 3 Encounters:  07/27/21 120 lb (54.4 kg)  07/20/21 118 lb 12 oz (53.9 kg)  07/13/21 118 lb 4 oz (53.6 kg)   . GENERAL:alert, in no acute distress and comfortable SKIN: no acute rashes, no significant lesions EYES: conjunctiva are pink and non-injected, sclera anicteric OROPHARYNX: MMM, no exudates, no oropharyngeal erythema or ulceration NECK: supple, no JVD LYMPH: Minimal residual palpable lymph node in the right lower neck no palpable lymphadenopathy in the axillary or inguinal regions LUNGS: clear to auscultation b/l with normal respiratory effort HEART: regular rate & rhythm ABDOMEN:  normoactive bowel sounds , non tender, not distended. Extremity: no pedal edema PSYCH: alert & oriented x 3 with fluent speech NEURO: no focal motor/sensory deficits   LABORATORY DATA:  I have reviewed the data as listed  . CBC Latest Ref Rng & Units 07/27/2021 07/20/2021 07/13/2021  WBC 4.0 - 10.5 K/uL 9.5 9.3 8.3  Hemoglobin 13.0 - 17.0 g/dL 12.2(L) 11.1(L) 11.8(L)  Hematocrit 39.0 - 52.0 % 38.5(L) 34.2(L) 37.5(L)  Platelets 150 - 400 K/uL 245 314 278    . CMP Latest Ref Rng & Units 07/27/2021 07/20/2021 07/13/2021  Glucose 70 - 99 mg/dL 283(H) 315(H) 226(H)  BUN 8 - 23 mg/dL 15 17 12   Creatinine 0.61 - 1.24 mg/dL 0.97 0.94 0.94  Sodium 135 - 145 mmol/L 136 137 138  Potassium 3.5 - 5.1 mmol/L 4.5 4.5 4.6  Chloride 98 - 111 mmol/L 100 102 101  CO2 22 - 32 mmol/L 22 25 25   Calcium 8.9 - 10.3 mg/dL 9.7 9.3 9.4  Total  Protein 6.5 - 8.1 g/dL 7.9 7.3 7.8  Total Bilirubin 0.3 - 1.2 mg/dL 0.4 0.4 0.3  Alkaline Phos 38 - 126 U/L 85 105 95  AST 15 - 41 U/L 13(L) 11(L) 11(L)  ALT 0 - 44 U/L 8 9 9    . Lab Results  Component Value Date   LDH 160 07/27/2021    06/18/18 Right Cervical LN Needle/core Biopsy:    05/23/18 Fine Needle Aspiration Flow Cytometry:    RADIOGRAPHIC STUDIES: I have personally reviewed the radiological images as listed and agreed with the findings in the report. No results found.  ASSESSMENT & PLAN:   70 y.o. male with  1. 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 Benign behavior is not established. Bulky adenopathy such as this could relate to lymphoma, or metastatic squamous cell carcinoma. There is no visible extranodal spread of tumor.  -06/18/18 biopsy favored a low grade Marginal Zone Lymphoma -06/15/18 PET/CT revealed Enlarged and hypermetabolic lymph nodes involving the neck, axilla, subpectoral and mediastinal lymph nodes. No lymphadenopathy below the diaphragm. No findings for osseous lymphoma -06/07/18 ECHO for treatment planning revealed a normal ejection fraction -02/02/21 PET/CT showed unchanged mediastinal lymphadenopathy with similar hypermetabolic activity. Interval development of moderate right and small left pleural effusion R > L.  -06/07/2021 Underwent thoracentesis with removal of 200 ml of straw colored fluid. Cytology revealed predominantly lymphocytic (95%) with 8-9% B-cells present. This is suggestive of involvement of pleural fluid by lymphoma.   PLAN: -Labs from today were reviewed.  WBC counts are normal  hemoglobin is normalized at 12.2 platelets within normal limits. -CMP stable except elevated blood sugars. -LDH remains within normal limits  -Patient has had no hypersensitivity reactions or significant toxicities from his first 3 cycles of weekly rituximab at this time . -Patient is stable and appropriate to proceed with fourth weekly dose of Rituxan with same supportive medications. -We will plan to get a PET CT scan in 7 weeks and see him back in 8 weeks with labs. -Depending on degree of response we will discuss with the patient pros versus cons of maintenance Rituxan for 1 to 2 years.   #2 generalized anxiety related to family stressors with the loss of his son about a year ago and significant trouble medical interventions including CABG and lymphoma treatment. Plan -We discussed that short-term use of an SSRI for management of his generalized anxiety and mild depression.  He notes that since he is finishing his lymphoma treatment he might feel better and wants to hold off on this at this time. -He does have as needed lorazepam to use for anxiety and insomnia. -He was asked to stay in touch with [place] regarding this if anxiety continue to affect his quality of life for what worse.  #3 hiccups likely from acid reflux. Cannot rule out possibility of diabetic gastroparesis. Plan -Has prescription for Reglan as needed and was also started on a PPI. -Follow-up with primary care physician to consider gastric emptying study if persistent.  #4 uncontrolled diabetes -Counseled to maintain good follow-up with his primary care physician for optimization of blood sugar control.  Follow-up Please cancel currently scheduled appointments for 08/02/2021. PET CT scan in 7 weeks. Return to clinic with Dr. Irene Limbo with labs in 8 weeks  . The total time spent in the appointment was 32 minutes and more than 50% was on counseling and direct patient cares.  Ordering and management of chemotherapy.  Patient  knows to call clinic if any other questions or acute concerns arise.   Sullivan Lone MD MS Hematology/Oncology Physician [facility]

## 2021-07-28 ENCOUNTER — Telehealth: Payer: Self-pay | Admitting: Hematology

## 2021-07-28 NOTE — Telephone Encounter (Signed)
Scheduled follow-up appointment per 9/6 los Patient is aware. Mailed calendar. 

## 2021-07-30 ENCOUNTER — Ambulatory Visit: Payer: 59 | Admitting: Family Medicine

## 2021-08-02 ENCOUNTER — Other Ambulatory Visit: Payer: 59

## 2021-08-02 ENCOUNTER — Telehealth: Payer: Self-pay

## 2021-08-02 ENCOUNTER — Ambulatory Visit: Payer: 59 | Admitting: Hematology

## 2021-08-02 NOTE — Telephone Encounter (Signed)
Contacted pt per his request. Pt states his throat hurts and he feels bad. Pt states he has low back pain. Pt has not checked his temperature. Per Dr Grier Mitts directive, encouraged pt to go to Urgent care or call PCP. Told pt he needs to be checked for COVID as well. Pt agreed to call PCP or go to Urgent Care in am. Pt states he had just taken tylenol po to help with symptoms.

## 2021-08-06 ENCOUNTER — Other Ambulatory Visit (HOSPITAL_COMMUNITY): Payer: Self-pay

## 2021-08-10 ENCOUNTER — Encounter: Payer: Self-pay | Admitting: Family Medicine

## 2021-08-10 ENCOUNTER — Ambulatory Visit (INDEPENDENT_AMBULATORY_CARE_PROVIDER_SITE_OTHER): Payer: 59 | Admitting: Pharmacist

## 2021-08-10 ENCOUNTER — Other Ambulatory Visit: Payer: Self-pay

## 2021-08-10 ENCOUNTER — Ambulatory Visit (INDEPENDENT_AMBULATORY_CARE_PROVIDER_SITE_OTHER): Payer: 59 | Admitting: Family Medicine

## 2021-08-10 VITALS — BP 143/62 | HR 81 | Ht 68.0 in | Wt 119.6 lb

## 2021-08-10 DIAGNOSIS — I10 Essential (primary) hypertension: Secondary | ICD-10-CM

## 2021-08-10 DIAGNOSIS — E11311 Type 2 diabetes mellitus with unspecified diabetic retinopathy with macular edema: Secondary | ICD-10-CM

## 2021-08-10 DIAGNOSIS — R066 Hiccough: Secondary | ICD-10-CM | POA: Diagnosis not present

## 2021-08-10 DIAGNOSIS — Z794 Long term (current) use of insulin: Secondary | ICD-10-CM | POA: Diagnosis not present

## 2021-08-10 DIAGNOSIS — Z23 Encounter for immunization: Secondary | ICD-10-CM | POA: Diagnosis not present

## 2021-08-10 LAB — POCT GLYCOSYLATED HEMOGLOBIN (HGB A1C): HbA1c, POC (controlled diabetic range): 9.6 % — AB (ref 0.0–7.0)

## 2021-08-10 MED ORDER — BACLOFEN 5 MG PO TABS: 5.0000 mg | ORAL_TABLET | Freq: Three times a day (TID) | ORAL | 0 refills | Status: DC | PRN

## 2021-08-10 NOTE — Patient Instructions (Signed)
Hiccups A hiccup is the result of a sudden irritation of a muscle that is used for breathing (diaphragm). The diaphragm is located under your lungs and above your stomach. When the diaphragm gets irritated, it may quickly tighten without your control (have a spasm). The spasm causes you to quickly suck in air, and that causes your vocal cords to close together quickly. These reactions cause the hiccup sound. Hiccups usually last only a short amount of time (less than 48 hours). In unusual cases, they can last for days or months and require you to see your health care provider. Common causes of hiccups include: Eating too fast or eating too much food. Drinking alcohol or bubbly (carbonated) drinks. Eating or drinking hot or spicy foods and drinks. Swallowing extra air when sucking on candy or a straw or when chewing on gum. Feeling nervous, stressed, or excited. Having certain conditions that irritate the diaphragm nerves. Having metabolic or nervous system disorders. Follow these instructions at home: To prevent hiccups or lessen discomfort from hiccups: Eat and chew your food slowly. Eat small meals, and avoid overeating. If you drink alcohol: Limit how much you have to: 0-1 drink a day for women who are not pregnant. 0-2 drinks a day for men. Know how much alcohol is in a drink. In the [place] one drink equals one 12 oz bottle of beer (355 mL), one 5 oz glass of wine (148 mL), or one 1 oz glass of hard liquor (44 mL). Limit your drinking of carbonated or fizzy drinks, such as soda. Avoid eating or drinking hot or spicy foods and drinks. General instructions Watch for any changes in your hiccups. Take over-the-counter and prescription medicines only as told by your health care provider. Contact a health care provider if: Your hiccups last for more than 48 hours. Your hiccups do not improve with treatment. You cannot sleep or eat because of your hiccups. You have unexpected weight loss  because of your hiccups. You have numbness, tingling, or weakness. Get help right away if: You have trouble breathing or swallowing. You have severe pain in your abdomen. These symptoms may represent a serious problem that is an emergency. Do not wait to see if the symptoms will go away. Get medical help right away. Call your local emergency services (911 in the [place] Do not drive yourself to the hospital. Summary A hiccup is the result of a sudden irritation of a muscle that is used for breathing (diaphragm). Hiccups can be caused by many things, including eating too fast. Call your health care provider if your hiccups last for more than 48 hours. This information is not intended to replace advice given to you by your health care provider. Make sure you discuss any questions you have with your health care provider. Document Revised: 07/10/2020 Document Reviewed: 07/10/2020 Elsevier Patient Education  [place].

## 2021-08-10 NOTE — Assessment & Plan Note (Signed)
A1C improved from 11 to 9.6 I encouraged him to restart his Trulicity. He agreed with the plan. Dr. Kelley/pharmacist also came in for provide some counseling. F/U with PCP in 3 months.

## 2021-08-10 NOTE — Patient Instructions (Addendum)
Alec York it was a pleasure seeing you today.   Please do the following:  Start Trulicity again as directed today during your appointment. If you have any questions or if you believe something has occurred because of this change, call me or your doctor to let one of [place] know.  Continue checking blood sugars at home. It's really important that you record these and bring these in to your next doctor's appointment.  Continue making the lifestyle changes we've discussed together during our visit. Diet and exercise play a significant role in improving your blood sugars.  Follow-up with me on October 6th at 8:30 AM   Hypoglycemia or low blood sugar:   Low blood sugar can happen quickly and may become an emergency if not treated right away.   While this shouldn't happen often, it can be brought upon if you skip a meal or do not eat enough. Also, if your insulin or other diabetes medications are dosed too high, this can cause your blood sugar to go to low.   Warning signs of low blood sugar include: Feeling shaky or dizzy Feeling weak or tired  Excessive hunger Feeling anxious or upset  Sweating even when you aren't exercising  What to do if I experience low blood sugar? Follow the Rule of 15 Check your blood sugar with your meter. If lower than 70, proceed to step 2.  Treat with 15 grams of fast acting carbs which is found in 3-4 glucose tablets. If none are available you can try hard candy, 1 tablespoon of sugar or honey,4 ounces of fruit juice, or 6 ounces of REGULAR soda.  Re-check your sugar in 15 minutes. If it is still below 70, do what you did in step 2 again. If your blood sugar has come back up, go ahead and eat a snack or small meal made up of complex carbs (ex. Whole grains) and protein at this time to avoid recurrence of low blood sugar.

## 2021-08-10 NOTE — Progress Notes (Signed)
Subjective:    Patient ID: Alec York, male    DOB: [birth year], 70 y.o.   MRN: [id number]   Patient is a 70 y.o. male who presents for diabetes management. He is in good spirits and presents with assistance of cane with wife. Patient was referred on 05/07/21 and last seen by provider, Dr. Gwendlyn Deutscher, prior to appointment.  He reports he has not been taking his Trulicity and has concerns that his blood glucose is high some mornings and wonders if he should increase his [place].  Insurance coverage/medication affordability: Medicare  Current diabetes medications include: Metformin 1050m BID, jardiance 252m Tresiba 10 units once daily Patient states that He is taking his diabetes medications as prescribed. Patient reports adherence with medications.   Patient reports a relatively carbohydrate heavy diet  Fasting: 133, 393 (3 weeks ago)  Denies hypoglycemia Reports vision changes  Freestyle Libre 2.0 patient education Person(s)instructed: patient  Patient taking >500 mg Vitamin C: denies Reminded patient to not take Vitamin C with Freestyle Libre.  Instruction: [employer] 14-day Freestyle Libre patient education  CGM overview and set-up 1. Button, touch screen, and icons 2. Power supply and recharging 3. Home screen 4. Date and time 5. Set BG target range: 80-250 mg/dL 6. Set alarm/alert tone  7. Interstitial vs. capillary blood glucose readings  8. When to verify sensor reading with fingerstick blood glucose  Sensor application -- sensor placed on left arm 1. Site selection and site prep with alcohol pad/Skin Tac 2. Sensor prep-sensor pack and sensor applicator 3. Starting the sensor: 1 hour warm up before BG readings available     Will ask for fingersticks the first 12 hours   4. Sensor change every 14 days and rotate site 5. Call [employer] customer service if sensor comes off before 14 days  Safety and Troubleshooting 1. Scan the sensor at least every 8 hours 2. When  the "test BG" symbol appears, test fingerstick blood glucose prior to    making treatment decisions 3. Do a fingerstick blood glucose test if the sensor readings do not match how    you feel 4. Remove sensor prior for MRI or CT. Sensor may be damaged by exposure to    airport x-ray screening 5. Vitamin C may cause false high readings and aspirin may cause false low     readings 6. Store sensor kit between 39 and 77 degrees. Can be refrigerated at this temp.  Contact information provided for [employer] service and/or trainer.  Objective:   Labs:   Lab Results  Component Value Date   HGBA1C 9.6 (A) 08/10/2021   HGBA1C 11.0 (A) 05/13/2021   HGBA1C 8.2 (A) 01/28/2021    Lab Results  Component Value Date   MICRALBCREAT 34 (H) 09/25/2019    Lipid Panel     Component Value Date/Time   CHOL 104 01/28/2021 1449   TRIG 101 01/28/2021 1449   HDL 36 (L) 01/28/2021 1449   CHOLHDL 2.9 01/28/2021 1449   CHOLHDL 2.9 05/31/2016 0843   VLDL 32 (H) 05/31/2016 0843   LDLCALC 49 01/28/2021 1449    Clinical Atherosclerotic Cardiovascular Disease (ASCVD): Yes The ASCVD Risk score (Arnett DK, et al., 2019) failed to calculate for the following reasons:   The valid total cholesterol range is 130 to 320 mg/dL    Assessment/Plan:   T2DM is not controlled but need to balance avoiding hypoglycemia while patient is receiving treatment for Non-Hodgkin's lymphoma. Medication adherence appears optimal. Additional pharmacotherapy is warranted. Will  have patient re-start Trulicity 8.24MP once weekly. Patient educated on purpose, proper use and potential adverse effects of Trulicity. Also provided patient food log to document due to variations in fasting blood glucose. Patient does not currently meet requirements to obtain CGM therefore placed a sample on for two weeks. Following instruction patient verbalized understanding of treatment plan.   Continued basal insulin degludec [name]) 10 units  once daily in AM Re-started GLP-1 dulaglutide (Trulicity) 5.36RW once weekly.  Continued SGLT2-I empagliflozin (Jardiance) 43m. Continued metformin 10047mBID Counseled on s/sx of and management of hypoglycemia Next A1C anticipated December 2022  Follow-up appointment in two weeks to review sugar readings and removed CGM. Written patient instructions provided.  This appointment required 30 minutes of direct patient care.  Thank you for involving pharmacy to assist in providing this patient's care.  Patient seen with MeMeyer RusselPharmD Candidate, and PaJuluis PitchPharmD - PGY-1 Resident.

## 2021-08-10 NOTE — Assessment & Plan Note (Signed)
His BP was initially elevated but improved with recheck. Unclear why he is not on ACEi/ARB. F/U with PCP to readdress.

## 2021-08-10 NOTE — Progress Notes (Signed)
    SUBJECTIVE:   CHIEF COMPLAINT / HPI:   DM/HTN: He complies with Tresiba 10 units daily, Metformin 1000 mg BID, and Jardiance 25 mg QD. However, he has been off his weekly trulicity injection because he was uncertain if he needed to take it with the [place]. He still has his meds at home and will plan on restarting this Thursday as he takes it every Thursday. For his BP he takes Metoprolol Succinate 50 mg QD.  Hiccups: Ongoing for months. Was treated with Reglan with no improvement. He denies any other GI symptoms. He wondered the cause of his hiccups and if he could receive a different treatment.   PERTINENT  PMH / PSH: PMX reviewed  OBJECTIVE:   Vitals:   08/10/21 0828 08/10/21 0910  BP: (!) 160/71 (!) 143/62  Pulse: 81   SpO2: 100%   Weight: 119 lb 9.6 oz (54.3 kg)   Height: 5\' 8"  (1.727 m)     Physical Exam Vitals and nursing note reviewed.  Constitutional:      General: He is not in acute distress.    Comments: Ambulates with a cane  Cardiovascular:     Rate and Rhythm: Normal rate and regular rhythm.     Heart sounds: Normal heart sounds. No murmur heard. Pulmonary:     Effort: Pulmonary effort is normal. No respiratory distress.     Breath sounds: Normal breath sounds. No wheezing.  Abdominal:     General: Abdomen is flat. Bowel sounds are normal. There is no distension.     Palpations: Abdomen is soft. There is no mass.     Tenderness: There is no abdominal tenderness.  Musculoskeletal:     Right lower leg: No edema.     Left lower leg: No edema.     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 He agreed with the plan. Dr. Kelley/pharmacist also came in for provide some counseling. F/U with PCP in 3 months.   Essential hypertension His BP was initially elevated but improved with recheck. Unclear why he is not on ACEi/ARB. F/U with PCP to  readdress.   Hiccups: He hiccupped throughout the visit intermittently. Differential discussed including malignancy, electrolyte abnormality and DM. Recent potassium level was normal. His last colonoscopy was 10 yrs ago or more. I discussed GI referral for colon cancer screen, but he declined. Consider CT abdomen in the future if his hiccups persist. D/C Reglan and I started him on Baclofen TID PRN. He will f/u with his PCP for further evaluation if no improvement with this treatment. He agreed with the plan.   Flu shot given today.  More than 50% of this 30 mins face to face encounter was spent on counseling and coordination of care.   Andrena Mews, MD [place]

## 2021-08-10 NOTE — Assessment & Plan Note (Signed)
He hiccupped throughout the visit intermittently. Differential discussed including malignancy, electrolyte abnormality and DM. Recent potassium level was normal. His last colonoscopy was 10 yrs ago or more. I discussed GI referral for colon cancer screen, but he declined. Consider CT abdomen in the future if his hiccups persist. D/C Reglan and I started him on Baclofen TID PRN. He will f/u with his PCP for further evaluation if no improvement with this treatment. He agreed with the plan.

## 2021-08-11 NOTE — Assessment & Plan Note (Signed)
T2DM is not controlled but need to balance avoiding hypoglycemia while patient is receiving treatment for Non-Hodgkin's lymphoma. Medication adherence appears optimal. Additional pharmacotherapy is warranted. Will have patient re-start Trulicity 0.75mg  once weekly. Patient educated on purpose, proper use and potential adverse effects of Trulicity. Also provided patient food log to document due to variations in fasting blood glucose. Patient does not currently meet requirements to obtain CGM therefore placed a sample on for two weeks. Following instruction patient verbalized understanding of treatment plan.   1. Continued basal insulin degludec [name]) 10 units once daily in AM 2. Re-started GLP-1 dulaglutide (Trulicity) 0.75mg  once weekly.  3. Continued SGLT2-I empagliflozin (Jardiance) 25mg . 4. Continued metformin 1000mg  BID 5. Counseled on s/sx of and management of hypoglycemia 6. Next A1C anticipated December 2022

## 2021-08-26 ENCOUNTER — Other Ambulatory Visit: Payer: Self-pay

## 2021-08-26 ENCOUNTER — Ambulatory Visit (INDEPENDENT_AMBULATORY_CARE_PROVIDER_SITE_OTHER): Payer: 59 | Admitting: Pharmacist

## 2021-08-26 DIAGNOSIS — E11311 Type 2 diabetes mellitus with unspecified diabetic retinopathy with macular edema: Secondary | ICD-10-CM

## 2021-08-26 DIAGNOSIS — Z794 Long term (current) use of insulin: Secondary | ICD-10-CM

## 2021-08-26 NOTE — Progress Notes (Signed)
   Subjective:    Patient ID: Alec York, male    DOB: 07-10-1951, 70 y.o.   MRN: [id number]   Patient is a 70 y.o. male who presents for diabetes management. He is in good spirits and presents with assistance of cane. Patient was referred on 05/07/21 and last seen by provider, Dr. Gwendlyn Deutscher on 08/10/21. Last seen in pharmacy clinic on same day.  Patient unable to stay for appointment and requests to re-schedule. Patient does report his blood glucose readings have continued to be elevated. He re-started Trulicity and reports no side effects.  Insurance coverage/medication affordability: Medicare  Current diabetes medications include: Metformin 1000mg  BID, jardiance 25mg , Tresiba 10 units once daily, Trulicity 0.75mg  once weekly Patient states that He is taking his diabetes medications as prescribed. Patient reports adherence with medications.   Patient reports a relatively carbohydrate heavy diet  Objective:     Labs:   Lab Results  Component Value Date   HGBA1C 9.6 (A) 08/10/2021   HGBA1C 11.0 (A) 05/13/2021   HGBA1C 8.2 (A) 01/28/2021    Lab Results  Component Value Date   MICRALBCREAT 34 (H) 09/25/2019    Lipid Panel     Component Value Date/Time   CHOL 104 01/28/2021 1449   TRIG 101 01/28/2021 1449   HDL 36 (L) 01/28/2021 1449   CHOLHDL 2.9 01/28/2021 1449   CHOLHDL 2.9 05/31/2016 0843   VLDL 32 (H) 05/31/2016 0843   LDLCALC 49 01/28/2021 1449    Clinical Atherosclerotic Cardiovascular Disease (ASCVD): Yes The ASCVD Risk score (Arnett DK, et al., 2019) failed to calculate for the following reasons:   The valid total cholesterol range is 130 to 320 mg/dL    Assessment/Plan:   T2DM is not controlled but need to balance avoiding hypoglycemia while patient is receiving treatment for Non-Hodgkin's lymphoma. Medication adherence appears optimal. Due to patient's time constraints with appointment unable to discuss plan. Based on CGM data patient likely needs  mealtime insulin with evening meal due to elevations in post-prandial blood glucose. Patient to re-schedule appointment to discuss further. Following instruction patient verbalized understanding of treatment plan.   Continued basal insulin degludec [name]) 10 units once daily in AM Continued GLP-1 dulaglutide (Trulicity) 0.75mg  once weekly.  Continued SGLT2-I empagliflozin (Jardiance) 25mg . Continued metformin 1000mg  BID Counseled on s/sx of and management of hypoglycemia Next A1C anticipated December 2022  Follow-up appointment when patient is able to review sugar readings and removed.  This appointment required 10 minutes of direct patient care.  Thank you for involving pharmacy to assist in providing this patient's care.

## 2021-09-04 ENCOUNTER — Other Ambulatory Visit: Payer: Self-pay | Admitting: Family Medicine

## 2021-09-04 DIAGNOSIS — E11311 Type 2 diabetes mellitus with unspecified diabetic retinopathy with macular edema: Secondary | ICD-10-CM

## 2021-09-04 DIAGNOSIS — Z794 Long term (current) use of insulin: Secondary | ICD-10-CM

## 2021-09-06 ENCOUNTER — Ambulatory Visit (INDEPENDENT_AMBULATORY_CARE_PROVIDER_SITE_OTHER): Payer: 59 | Admitting: Family Medicine

## 2021-09-06 ENCOUNTER — Telehealth: Payer: Self-pay | Admitting: Family Medicine

## 2021-09-06 ENCOUNTER — Encounter: Payer: Self-pay | Admitting: Family Medicine

## 2021-09-06 ENCOUNTER — Other Ambulatory Visit: Payer: Self-pay

## 2021-09-06 DIAGNOSIS — M545 Low back pain, unspecified: Secondary | ICD-10-CM | POA: Diagnosis not present

## 2021-09-06 DIAGNOSIS — R269 Unspecified abnormalities of gait and mobility: Secondary | ICD-10-CM | POA: Diagnosis not present

## 2021-09-06 DIAGNOSIS — R531 Weakness: Secondary | ICD-10-CM

## 2021-09-06 MED ORDER — DICLOFENAC SODIUM 1 % EX GEL: 2.0000 g | Freq: Four times a day (QID) | CUTANEOUS | 3 refills | Status: AC

## 2021-09-06 NOTE — Progress Notes (Signed)
    SUBJECTIVE:   CHIEF COMPLAINT / HPI:   Back and buttocks pain after a fall one week ago. Patient was up at night with lights off.  Went to sit on bed and missed, squarely striking his buttocks.  Also fell back and struck his head.  No loss of consciousness.  Initial headache has resolved. C/O continued midline low back pain and bilateral buttocks pain.  No change in bowel or bladder.  Patient is immunocompromised (see below) and denies fever.    Patient is high risk in that he has cardiac hx and both lymphoma and CLL.  Not much back pain before.  Did have radiographic evidence of lumbar arthropathy/DDD.  He is chronically underweight.  He does not have other falls.    OBJECTIVE:   BP 115/61   Pulse 80   Ht 5\' 8"  (1.727 m)   Wt 118 lb 3.2 oz (53.6 kg)   SpO2 100%   BMI 17.97 kg/m   Lungs clear Cardiac RRR without m or g Low pain  Mild pain in the SI region bilaterally.  Minimal lumbar tenderness.  No visible bruising.  Does have a small, healing abrasion of left lumbar area. Good ROM of hips.   Marked loss of musculature of the quads and gluteus muscles bilaterally Gait, unsteady.  Uses cane.  Asks about walker.  ASSESSMENT/PLAN:   Low back pain From fall.  No bony tenderness to suggest fracture.  Given his loss of padding (atrophic gluteus and buttock fat), I am certain it was an impactful fall Home PT, regular tylenol and voltarin gel.  Generalized weakness Seems chronically weak and at risk for fall.  Home PT.  Gait disturbance I agree needs walker.  Await PT eval so that I order the corrrect walker for him.       Zenia Resides, MD [place]

## 2021-09-06 NOTE — Patient Instructions (Signed)
I have ordered home physical therapy.  Someone should call. Please take one extra strength tylenol four times per day for the back pain. I also sent in a prescription for a pain rub for your back.   Let's wait on writing the prescription for the walker until the physical therapy people tell be exactly what type of walker will be best for you.   Also mention your left shoulder problems to the physical therapy people so they can work on that, too.

## 2021-09-06 NOTE — Assessment & Plan Note (Signed)
From fall.  No bony tenderness to suggest fracture.  Given his loss of padding (atrophic gluteus and buttock fat), I am certain it was an impactful fall Home PT, regular tylenol and voltarin gel.

## 2021-09-06 NOTE — Telephone Encounter (Signed)
[employer] [place] form dropped off  at front desk for completion.  Verified that patient section of form has been completed.  Last DOS/WCC with PCP was 09/06/21.  Placed form in team folder to be completed by clinical staff.  Creig Hines

## 2021-09-06 NOTE — Telephone Encounter (Signed)
Clinical info completed on [employer] [place] form.  Place form in Dr Serita Grit box for completion.  Ottis Stain, CMA

## 2021-09-06 NOTE — Assessment & Plan Note (Signed)
I agree needs walker.  Await PT eval so that I order the corrrect walker for him.

## 2021-09-06 NOTE — Assessment & Plan Note (Signed)
Seems chronically weak and at risk for fall.  Home PT.

## 2021-09-07 ENCOUNTER — Other Ambulatory Visit: Payer: Self-pay | Admitting: Family Medicine

## 2021-09-07 DIAGNOSIS — E11311 Type 2 diabetes mellitus with unspecified diabetic retinopathy with macular edema: Secondary | ICD-10-CM

## 2021-09-09 ENCOUNTER — Other Ambulatory Visit: Payer: Self-pay | Admitting: Family Medicine

## 2021-09-09 DIAGNOSIS — Z794 Long term (current) use of insulin: Secondary | ICD-10-CM

## 2021-09-09 DIAGNOSIS — E11311 Type 2 diabetes mellitus with unspecified diabetic retinopathy with macular edema: Secondary | ICD-10-CM

## 2021-09-21 ENCOUNTER — Other Ambulatory Visit: Payer: 59

## 2021-09-21 ENCOUNTER — Ambulatory Visit: Payer: 59 | Admitting: Hematology

## 2021-09-22 ENCOUNTER — Other Ambulatory Visit: Payer: Self-pay

## 2021-09-22 ENCOUNTER — Ambulatory Visit (HOSPITAL_COMMUNITY)
Admission: RE | Admit: 2021-09-22 | Discharge: 2021-09-22 | Disposition: A | Discharge status: Home / Self Care | Payer: 59 | Source: Ambulatory Visit | Attending: Hematology | Admitting: Hematology

## 2021-09-22 ENCOUNTER — Telehealth: Payer: Self-pay

## 2021-09-22 DIAGNOSIS — I700 Atherosclerosis of aorta: Secondary | ICD-10-CM | POA: Insufficient documentation

## 2021-09-22 DIAGNOSIS — I2510 Atherosclerotic heart disease of native coronary artery without angina pectoris: Secondary | ICD-10-CM | POA: Insufficient documentation

## 2021-09-22 DIAGNOSIS — J90 Pleural effusion, not elsewhere classified: Secondary | ICD-10-CM | POA: Diagnosis not present

## 2021-09-22 DIAGNOSIS — C8590 Non-Hodgkin lymphoma, unspecified, unspecified site: Secondary | ICD-10-CM | POA: Diagnosis present

## 2021-09-22 LAB — GLUCOSE, CAPILLARY: Glucose-Capillary: 156 mg/dL — ABNORMAL HIGH (ref 70–99)

## 2021-09-22 MED ORDER — FLUDEOXYGLUCOSE F - 18 (FDG) INJECTION
6.1000 | Freq: Once | INTRAVENOUS | Status: AC
  Administered 2021-09-22: 5.8 via INTRAVENOUS

## 2021-09-22 NOTE — Telephone Encounter (Signed)
Patient calls nurse line checking the status of [place] referral.   Per chart review our referral coordinator has reached out to several agencies, however availability appears to be an issue.   I asked the patient be patient as we are actively looking for a [facility] agency that can accommodate him.

## 2021-09-22 NOTE — Telephone Encounter (Signed)
I have looked into 4 agencies and waiting to hear back from the 4th.  The other 3 were unable to accept patient.  If this agency doesn't accept patient, provider will need to discuss other options with patient. Alexandre Lightsey,CMA

## 2021-09-23 ENCOUNTER — Telehealth: Payer: Self-pay

## 2021-09-23 DIAGNOSIS — R531 Weakness: Secondary | ICD-10-CM

## 2021-09-23 DIAGNOSIS — M545 Low back pain, unspecified: Secondary | ICD-10-CM

## 2021-09-23 DIAGNOSIS — R269 Unspecified abnormalities of gait and mobility: Secondary | ICD-10-CM

## 2021-09-23 NOTE — Telephone Encounter (Signed)
Patient calls nurse line requesting DME order for walker. Patient reports that he is having difficulties with ambulation due to back pain. Patient states he would like to have this for assistance until he is able to start home health PT.   Please advise.   Talbot Grumbling, RN

## 2021-09-23 NOTE — Telephone Encounter (Signed)
DME walker with 5" rolling wheels order placed.  Gladys Damme, MD [place] [facility], PGY-3

## 2021-09-24 ENCOUNTER — Other Ambulatory Visit: Payer: Self-pay

## 2021-09-24 ENCOUNTER — Inpatient Hospital Stay (HOSPITAL_BASED_OUTPATIENT_CLINIC_OR_DEPARTMENT_OTHER): Payer: 59 | Admitting: Hematology

## 2021-09-24 ENCOUNTER — Inpatient Hospital Stay: Payer: 59 | Attending: Hematology

## 2021-09-24 VITALS — BP 113/65 | HR 76 | Temp 97.7°F | Resp 18 | Ht 68.0 in | Wt 122.0 lb

## 2021-09-24 DIAGNOSIS — Z7984 Long term (current) use of oral hypoglycemic drugs: Secondary | ICD-10-CM | POA: Diagnosis not present

## 2021-09-24 DIAGNOSIS — Z79899 Other long term (current) drug therapy: Secondary | ICD-10-CM | POA: Diagnosis not present

## 2021-09-24 DIAGNOSIS — Z87891 Personal history of nicotine dependence: Secondary | ICD-10-CM | POA: Insufficient documentation

## 2021-09-24 DIAGNOSIS — F411 Generalized anxiety disorder: Secondary | ICD-10-CM | POA: Diagnosis not present

## 2021-09-24 DIAGNOSIS — E1165 Type 2 diabetes mellitus with hyperglycemia: Secondary | ICD-10-CM | POA: Diagnosis not present

## 2021-09-24 DIAGNOSIS — C8590 Non-Hodgkin lymphoma, unspecified, unspecified site: Secondary | ICD-10-CM | POA: Diagnosis not present

## 2021-09-24 DIAGNOSIS — J90 Pleural effusion, not elsewhere classified: Secondary | ICD-10-CM | POA: Diagnosis not present

## 2021-09-24 DIAGNOSIS — C8591 Non-Hodgkin lymphoma, unspecified, lymph nodes of head, face, and neck: Secondary | ICD-10-CM | POA: Diagnosis not present

## 2021-09-24 LAB — CBC WITH DIFFERENTIAL/PLATELET
Abs Immature Granulocytes: 0.03 10*3/uL (ref 0.00–0.07)
Basophils Absolute: 0.1 10*3/uL (ref 0.0–0.1)
Basophils Relative: 1 %
Eosinophils Absolute: 0.3 10*3/uL (ref 0.0–0.5)
Eosinophils Relative: 3 %
HCT: 36.7 % — ABNORMAL LOW (ref 39.0–52.0)
Hemoglobin: 11.2 g/dL — ABNORMAL LOW (ref 13.0–17.0)
Immature Granulocytes: 0 %
Lymphocytes Relative: 28 %
Lymphs Abs: 3.1 10*3/uL (ref 0.7–4.0)
MCH: 24.5 pg — ABNORMAL LOW (ref 26.0–34.0)
MCHC: 30.5 g/dL (ref 30.0–36.0)
MCV: 80.3 fL (ref 80.0–100.0)
Monocytes Absolute: 0.8 10*3/uL (ref 0.1–1.0)
Monocytes Relative: 8 %
Neutro Abs: 6.5 10*3/uL (ref 1.7–7.7)
Neutrophils Relative %: 60 %
Platelets: 254 10*3/uL (ref 150–400)
RBC: 4.57 MIL/uL (ref 4.22–5.81)
RDW: 17 % — ABNORMAL HIGH (ref 11.5–15.5)
WBC: 10.8 10*3/uL — ABNORMAL HIGH (ref 4.0–10.5)
nRBC: 0 % (ref 0.0–0.2)

## 2021-09-24 LAB — CMP (CANCER CENTER ONLY)
ALT: 8 U/L (ref 0–44)
AST: 12 U/L — ABNORMAL LOW (ref 15–41)
Albumin: 3.5 g/dL (ref 3.5–5.0)
Alkaline Phosphatase: 94 U/L (ref 38–126)
Anion gap: 10 (ref 5–15)
BUN: 16 mg/dL (ref 8–23)
CO2: 25 mmol/L (ref 22–32)
Calcium: 9.3 mg/dL (ref 8.9–10.3)
Chloride: 102 mmol/L (ref 98–111)
Creatinine: 0.87 mg/dL (ref 0.61–1.24)
GFR, Estimated: >60 mL/min (ref >60)
Glucose, Bld: 253 mg/dL — ABNORMAL HIGH (ref 70–99)
Potassium: 4.6 mmol/L (ref 3.5–5.1)
Sodium: 137 mmol/L (ref 135–145)
Total Bilirubin: 0.3 mg/dL (ref 0.3–1.2)
Total Protein: 7.4 g/dL (ref 6.5–8.1)

## 2021-09-24 LAB — LACTATE DEHYDROGENASE: LDH: 133 U/L (ref 98–192)

## 2021-09-24 NOTE — Telephone Encounter (Signed)
Community message sent to [employer]. Will await response.   Leialoha Hanna C Flossie Wexler, RN  

## 2021-09-27 ENCOUNTER — Telehealth: Payer: Self-pay | Admitting: Hematology

## 2021-09-27 NOTE — Telephone Encounter (Signed)
Below message received from [employer].   I'll get this processed! Thank you!   Talbot Grumbling, RN

## 2021-09-27 NOTE — Telephone Encounter (Signed)
Scheduled follow-up appointment per 11/4 los. Patient is aware. Mailed calendar.

## 2021-09-29 ENCOUNTER — Other Ambulatory Visit: Payer: Self-pay | Admitting: Family Medicine

## 2021-09-29 DIAGNOSIS — E11311 Type 2 diabetes mellitus with unspecified diabetic retinopathy with macular edema: Secondary | ICD-10-CM

## 2021-09-30 ENCOUNTER — Encounter: Payer: Self-pay | Admitting: Hematology

## 2021-09-30 NOTE — Progress Notes (Addendum)
HEMATOLOGY/ONCOLOGY CLNIC NOTE  Date of Service: .09/24/2021   Patient Care Team: Lattie Haw, MD as PCP - General (Family Medicine)  Melissa Montane, MD as ENT  CHIEF COMPLAINTS/PURPOSE OF CONSULTATION:  Non-Hodgkin's Lymphoma   CURRENT TREATMENT: Follow-up for marginal zone lymphoma prior to cycle 4 of Rituximab monotherapy  INTERVAL HISTORY:   Keante MELVYN HOMMES returns today for management and evaluation of his Non-Hodgkin's Lymphoma and for evaluation after completion of his 4 weekly doses of Rituximab for his nodal marginal zone lymphoma.    He was last seen in clinic on 07/27/2021. Patient tolerated course of Rituxan well and notes no new infection issues.  He did fall on his buttocks when he tried to sit and missed his bed and has some lower back and buttock discomfort which is being evaluated and managed by his primary care doctor.  Patient notes no other acute new symptoms.  No fevers no chills no night sweats.  Cervical lymphadenopathy is decreasing in size. He does not note issues with intermittent anxiety related to the loss of his son and his medical issues including his recent need for CABG and treatment of his lymphoma.   Labs done today 09/24/2021 CBC shows hemoglobin of 11.2 with a WBC count of 10.8k and platelets of 254k CMP unremarkable other than hyperglycemia of 253 LDH 133   PET CT scan done on 09/22/2021 showed Lymph nodes in the chest and cervical spine show general decrease in size but with similar metabolic activity when compared to previous imaging.   Decreasing pleural effusions.   Focal uptake, mild in the distal esophagus which is either thickened or accentuated by collapse. Correlate with any symptoms that would suggest esophagitis. Consider dedicated esophageal assessment as warranted for further evaluation.   Activity along the inferior endplate of A41 is favored to be degenerative. Attention on subsequent imaging and correlate with  any signs of back pain.   Patient notes no other acute new symptoms.  MEDICAL HISTORY:  Past Medical History:  Diagnosis Date   Allergy    Coronary artery disease    Diabetes mellitus without complication (HCC)    Diabetic retinopathy ([place])    Disabled 2/2 retinopathy   GERD (gastroesophageal reflux disease)    Hyperlipidemia    Hypertension     SURGICAL HISTORY: Past Surgical History:  Procedure Laterality Date   CARDIAC CATHETERIZATION     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   CABG x 4 [place]    SOCIAL HISTORY: Social History   Socioeconomic History   Marital status: Married    Spouse name: Not on file   Number of children: 2   Years of education: Not on file   Highest education level: Not on file  Occupational History   Occupation: Retired  Tobacco Use   Smoking status: Former    Packs/day: 0.50    Years: 10.00    Pack years: 5.00    Types: Cigarettes    Start date: 1971    Quit date: 2010    Years since quitting: 12.8   Smokeless tobacco: Never  Vaping Use   Vaping Use: Never used  Substance and Sexual Activity   Alcohol use: No    Alcohol/week: 0.0 standard drinks   Drug use: No   Sexual activity: Yes    Birth control/protection: None  Other Topics Concern   Not on file  Social History Narrative   Not on file   Social Determinants of Radio broadcast assistant  Strain: Not on file  Food Insecurity: Not on file  Transportation Needs: Not on file  Physical Activity: Not on file  Stress: Not on file  Social Connections: Not on file  Intimate Partner Violence: Not on file    FAMILY HISTORY: Family History  Problem Relation Age of Onset   Heart disease Mother    Diabetes Neg Hx     ALLERGIES:  has No Known Allergies.  MEDICATIONS:  Current Outpatient Medications  Medication Sig Dispense Refill   Accu-Chek FastClix Lancets MISC Use to test sugars up to 4 times daily.  Dx Code: E11.319 102 each 12   Accu-Chek Softclix Lancets  lancets Use as instructed 100 each 12   aspirin EC 81 MG tablet Take 1 tablet (81 mg total) by mouth daily. Swallow whole. 90 tablet 3   baclofen 5 MG TABS Take 5 mg by mouth 3 (three) times daily as needed for muscle spasms. 30 tablet 0   blood glucose meter kit and supplies KIT Dispense based on patient and insurance preference. Use up to four times daily as directed. Dx E11.319 1 each 0   Blood Glucose Monitoring Suppl (ACCU-CHEK GUIDE) w/Device KIT 1 each by Does not apply route 4 (four) times daily as needed. Use to test sugars up to 4 times daily.  Dx Code: E11.319 1 kit 0   Blood Pressure Monitoring (BLOOD PRESSURE CUFF) MISC 1 Units by Does not apply route daily. 1 each 0   diclofenac Sodium (VOLTAREN) 1 % GEL Apply 2 g topically 4 (four) times daily. 150 g 3   glucose blood (ACCU-CHEK GUIDE) test strip Use to test sugars up to 4 times daily.  Dx Code: E11.319 200 each 12   Insulin Pen Needle (RELION PEN NEEDLES) 31G X 6 MM MISC USE AS DIRECTED TO  CHECK  BLOOD  SUGAR 50 each 23   iron polysaccharides (NIFEREX) 150 MG capsule Take 1 capsule (150 mg total) by mouth daily. 30 capsule 5   JARDIANCE 25 MG TABS tablet Take 1 tablet by mouth once daily 90 tablet 0   Lancet Devices (ACCU-CHEK SOFTCLIX) lancets Use as instructed up to 4 times daily.  Dx E11.319 400 each 3   Lancets Misc. (ACCU-CHEK FASTCLIX LANCET) KIT Use to test sugars up to 4 times daily.  Dx Code: E11.319 1 kit 0   LORazepam (ATIVAN) 0.5 MG tablet Take 1 tablet (0.5 mg total) by mouth at bedtime as needed for anxiety. (Patient not taking: Reported on 08/10/2021) 20 tablet 0   metFORMIN (GLUCOPHAGE) 1000 MG tablet Take 1 tablet by mouth twice daily 180 tablet 0   metoprolol succinate (TOPROL-XL) 50 MG 24 hr tablet Take 1 tablet (50 mg total) by mouth at bedtime. Take with or immediately following a meal. 90 tablet 3   nitroGLYCERIN (NITROSTAT) 0.4 MG SL tablet Place 1 tablet (0.4 mg total) under the tongue every 5 (five) minutes as  needed for chest pain. (Patient not taking: Reported on 08/10/2021) 30 tablet 12   pantoprazole (PROTONIX) 40 MG tablet Take 1 tablet (40 mg total) by mouth daily before breakfast. 30 tablet 0   rosuvastatin (CRESTOR) 10 MG tablet Take 1 tablet (10 mg total) by mouth daily. 90 tablet 3   TRESIBA FLEXTOUCH 100 UNIT/ML FlexTouch Pen INJECT 10 UNITS SUBCUTANEOUSLY ONCE DAILY 15 mL 0   TRULICITY 1.44 RX/5.4MG SOPN INJECT 0.75 MG INTO THE SKIN ONCE A WEEK 4 mL 0   vitamin B-12 (CYANOCOBALAMIN) 500 MCG tablet Take  500 mcg by mouth daily.     No current facility-administered medications for this visit.    REVIEW OF SYSTEMS:   .10 Point review of Systems was done is negative except as noted above..10 Point review of Systems was done is negative except as noted above.   PHYSICAL EXAMINATION: ECOG FS:1 - Symptomatic but completely ambulatory Vital signs reviewed Wt Readings from Last 3 Encounters:  09/24/21 122 lb (55.3 kg)  09/06/21 118 lb 3.2 oz (53.6 kg)  08/10/21 119 lb 9.6 oz (54.3 kg)   . GENERAL:alert, in no acute distress and comfortable SKIN: no acute rashes, no significant lesions EYES: conjunctiva are pink and non-injected, sclera anicteric OROPHARYNX: MMM, no exudates, no oropharyngeal erythema or ulceration NECK: supple, no JVD LYMPH: Minimally palpable cervical lymph node which are nearly resolved.  No other palpable adenopathy noted. LUNGS: clear to auscultation b/l with normal respiratory effort HEART: regular rate & rhythm ABDOMEN:  normoactive bowel sounds , non tender, not distended.  No palpable hepatosplenomegaly. Extremity: no pedal edema PSYCH: alert & oriented x 3 with fluent speech NEURO: no focal motor/sensory deficits   LABORATORY DATA:  I have reviewed the data as listed  . CBC Latest Ref Rng & Units 09/24/2021 07/27/2021 07/20/2021  WBC 4.0 - 10.5 K/uL 10.8(H) 9.5 9.3  Hemoglobin 13.0 - 17.0 g/dL 11.2(L) 12.2(L) 11.1(L)  Hematocrit 39.0 - 52.0 % 36.7(L)  38.5(L) 34.2(L)  Platelets 150 - 400 K/uL 254 245 314    . CMP Latest Ref Rng & Units 09/24/2021 07/27/2021 07/20/2021  Glucose 70 - 99 mg/dL 253(H) 283(H) 315(H)  BUN 8 - 23 mg/dL _0 Creatinine 0.61 - 1.24 mg/dL 0.87 0.97 0.94  Sodium 135 - 145 mmol/L 137 136 137  Potassium 3.5 - 5.1 mmol/L 4.6 4.5 4.5  Chloride 98 - 111 mmol/L 102 100 102  CO2 22 - 32 mmol/L _1 Calcium 8.9 - 10.3 mg/dL 9.3 9.7 9.3  Total Protein 6.5 - 8.1 g/dL 7.4 7.9 7.3  Total Bilirubin 0.3 - 1.2 mg/dL 0.3 0.4 0.4  Alkaline Phos 38 - 126 U/L 94 85 105  AST 15 - 41 U/L 12(L) 13(L) 11(L)  ALT 0 - 44 U/L _2 . Lab Results  Component Value Date   LDH 133 09/24/2021    06/18/18 Right Cervical LN Needle/core Biopsy:    05/23/18 Fine Needle Aspiration Flow Cytometry:    RADIOGRAPHIC STUDIES: I have personally reviewed the radiological images as listed and agreed with the findings in the report. NM PET Image Restag (PS) Skull Base To Thigh  Result Date: 09/22/2021 CLINICAL DATA:  Subsequent treatment strategy for marginal zone lymphoma in a 70 year old male, assess treatment response. EXAM: NUCLEAR MEDICINE PET SKULL BASE TO THIGH TECHNIQUE: 5.8 mCi F-18 FDG was injected intravenously. Full-ring PET imaging was performed from the skull base to thigh after the radiotracer. CT data was obtained and used for attenuation correction and anatomic localization. Fasting blood glucose: 156 mg/dl COMPARISON:  Comparison made with prior PET exam February 02, 2021, also with prior imaging from 2019. FINDINGS: Mediastinal blood pool activity: SUV max 2.71 Liver activity: SUV max 2.83 NECK: Dominant lymph node in the RIGHT posterior cervical region previously 13 mm short axis now 8 mm with a maximum SUV of 2.33 as compared to 3.2 (image 34/4) scattered smaller lymph nodes throughout the neck show similar decrease in size. Incidental CT findings: none CHEST: Bilateral axillary and mediastinal lymph nodes with decrease  in  size. LEFT subpectoral lymph node (image 54/4) 13 mm, previously 14 mm short axis with a maximum SUV of 3.2. Previously with a maximum SUV of 3.1. Dominant RIGHT axillary lymph node (image 54/4) 14 mm short axis, previously 21 mm now with a maximum SUV of 3.5 as compared to 3.4 (Image 59/4) LEFT axillary lymph node measuring 16 mm short axis including the hilum previously up to 17 mm with a maximum SUV of 3.2 as compared to 3.0 on the previous study. Similar decrease in size of remaining axillary lymph nodes. Mild focal uptake in the distal esophagus with mild circumferential thickening in the area maximum SUV of 4.0. Patulous esophagus as before above this level. Mediastinal lymph nodes also with diminished size. SUV of subcarinal adenopathy (image 73/4) 3.4, previously 3.4 with short axis dimension of 1 cm as compared to 1.5 cm. Chronic pleural effusions are diminished. Depth on the RIGHT of 3.1 as compared to 3.9 cm with near complete resolution of LEFT-sided effusion and with signs of LEFT lower lobe scarring. Incidental CT findings: Calcified atheromatous plaque of the thoracic aorta. No aneurysmal dilation. Three-vessel coronary artery disease without substantial pericardial effusion. Post median sternotomy for CABG. ABDOMEN/PELVIS: No abnormal hypermetabolic activity within the liver, pancreas, adrenal glands, or spleen. No hypermetabolic lymph nodes in the abdomen or pelvis. Spleen is normal size. Uptake less than background liver. Incidental CT findings: Cholelithiasis. Aortic atherosclerosis. No acute intra-abdominal process. Stable hazy appearance of small bowel mesentery. SKELETON: No focal hypermetabolic activity to suggest skeletal metastasis. Incidental CT findings: T12 with mildly increased metabolic activity along the inferior endplate follows the endplate and there are changes of worsening endplate degenerative process since previous imaging. IMPRESSION: Lymph nodes in the chest and cervical spine  show general decrease in size but with similar metabolic activity when compared to previous imaging. Decreasing pleural effusions. Focal uptake, mild in the distal esophagus which is either thickened or accentuated by collapse. Correlate with any symptoms that would suggest esophagitis. Consider dedicated esophageal assessment as warranted for further evaluation. Activity along the inferior endplate of D14 is favored to be degenerative. Attention on subsequent imaging and correlate with any signs of back pain. Aortic Atherosclerosis (ICD10-I70.0). Electronically Signed   By: Zetta Bills M.D.   On: 09/22/2021 14:49    ASSESSMENT & PLAN:   70 y.o. male with  1. 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 Benign behavior is not established. Bulky adenopathy such as this could relate to lymphoma, or metastatic squamous cell carcinoma. There is no visible extranodal spread of tumor.  -06/18/18 biopsy favored a low grade Marginal Zone Lymphoma -06/15/18 PET/CT revealed Enlarged and hypermetabolic lymph nodes involving the neck, axilla, subpectoral and mediastinal lymph nodes. No lymphadenopathy below the diaphragm. No findings for osseous lymphoma -06/07/18 ECHO for treatment planning revealed a normal ejection fraction -02/02/21 PET/CT showed unchanged mediastinal lymphadenopathy with similar hypermetabolic activity. Interval development of moderate right and small left pleural effusion R > L.  -06/07/2021 Underwent thoracentesis with removal of 200 ml of straw colored fluid. Cytology revealed predominantly lymphocytic (95%) with 8-9% B-cells present. This is  suggestive of involvement of pleural fluid by lymphoma.  PLAN: L-abs done today 09/24/2021 CBC shows hemoglobin of 11.2 with a WBC count of 10.8k and platelets of 254k CMP unremarkable other than hyperglycemia of 253 LDH 133   -PET CT scan done on 09/22/2021 showed Lymph nodes in the chest and cervical spine show general decrease in size but with similar metabolic activity when compared to previous imaging.   Decreasing pleural effusions.   Focal uptake, mild in the distal esophagus which is either thickened or accentuated by collapse. Correlate with any symptoms that would suggest esophagitis. Consider dedicated esophageal assessment as warranted for further evaluation.   Activity along the inferior endplate of V14 is favored to be degenerative. Attention on subsequent imaging and correlate with any signs of back pain. -Patient has had no hypersensitivity reactions or significant toxicities from his first 3 cycles of weekly rituximab at this time . -Patient is stable and appropriate to proceed with fourth weekly dose of Rituxan with same supportive medications. -We will plan to get a PET CT scan in 7 weeks and see him back in 8 weeks with labs. -Depending on degree of response we will discuss with the patient pros versus cons of maintenance Rituxan for 1 to 2 years.   #2 generalized anxiety related to family stressors with the loss of his son about a year ago and significant trouble medical interventions including CABG and lymphoma treatment. Plan -We discussed that short-term use of an SSRI for management of his generalized anxiety and mild depression.  He notes that since he is finishing his lymphoma treatment he might feel better and wants to hold off on this at this time. -He does have as needed lorazepam to use for anxiety and insomnia. -He was asked to stay in touch with [place] regarding this if anxiety continue to affect his quality of life for what worse.  #3 uncontrolled  diabetes -Counseled to maintain good follow-up with his primary care physician for optimization of blood sugar control.  Follow-up RTC with Dr Irene Limbo with labs in 4 months    The total time spent in the appointment was 30 minutes and more than 50% was on counseling and direct patient cares.  Patient knows to call clinic if any other questions or acute concerns arise.   Sullivan Lone MD MS Hematology/Oncology Physician [facility]  .

## 2021-10-05 ENCOUNTER — Other Ambulatory Visit: Payer: Self-pay | Admitting: Family Medicine

## 2021-10-05 DIAGNOSIS — R269 Unspecified abnormalities of gait and mobility: Secondary | ICD-10-CM

## 2021-10-07 ENCOUNTER — Telehealth: Payer: Self-pay

## 2021-10-07 NOTE — Telephone Encounter (Signed)
   Telephone encounter was:  Successful.  10/07/2021 Name: Alec York MRN: [id number] DOB: [birth year]  Alec York is a 70 y.o. year old male who is a primary care patient of Lattie Haw, MD . The community resource team was consulted for assistance with Transportation Needs   Care guide performed the following interventions: Spoke with patient he has applied for [employer] but it will be 21 days before they process his application, he has exhausted his rides from [facility], gave contact information for [place].  Follow Up Plan:  No further follow up planned at this time. The patient has been provided with needed resources.  [name], AAS Paralegal, [place] [facility]  [address], [place] [address] ??[email or web address]  ?? [phone]   [email or web address]

## 2021-10-20 ENCOUNTER — Other Ambulatory Visit: Payer: Self-pay | Admitting: Family Medicine

## 2021-10-20 DIAGNOSIS — E11311 Type 2 diabetes mellitus with unspecified diabetic retinopathy with macular edema: Secondary | ICD-10-CM

## 2021-11-16 ENCOUNTER — Other Ambulatory Visit: Payer: Self-pay

## 2021-11-16 ENCOUNTER — Ambulatory Visit: Payer: 59 | Attending: Family Medicine | Admitting: Physical Therapy

## 2021-11-16 DIAGNOSIS — R269 Unspecified abnormalities of gait and mobility: Secondary | ICD-10-CM | POA: Diagnosis not present

## 2021-11-16 DIAGNOSIS — M6281 Muscle weakness (generalized): Secondary | ICD-10-CM | POA: Diagnosis present

## 2021-11-16 DIAGNOSIS — R2681 Unsteadiness on feet: Secondary | ICD-10-CM | POA: Diagnosis present

## 2021-11-16 DIAGNOSIS — M5440 Lumbago with sciatica, unspecified side: Secondary | ICD-10-CM | POA: Diagnosis present

## 2021-11-16 DIAGNOSIS — R2689 Other abnormalities of gait and mobility: Secondary | ICD-10-CM | POA: Insufficient documentation

## 2021-11-16 NOTE — Therapy (Signed)
OUTPATIENT PHYSICAL THERAPY THORACOLUMBAR EVALUATION   Patient Name: Alec York MRN: [id number] DOB:[birth year], 70 y.o., male Today's Date: 11/16/2021   PT End of Session - 11/16/21 1308     Visit Number 1    Number of Visits 16    Date for PT Re-Evaluation 01/11/22    Authorization Type [employer] MCR/ MCD secondary    PT Start Time 1103    PT Stop Time 1150    PT Time Calculation (min) 47 min    Activity Tolerance Patient limited by fatigue    Behavior During Therapy Agitated             Past Medical History:  Diagnosis Date   Allergy    Coronary artery disease    Diabetes mellitus without complication ([place])    Diabetic retinopathy ([place])    Disabled 2/2 retinopathy   GERD (gastroesophageal reflux disease)    Hyperlipidemia    Hypertension    Past Surgical History:  Procedure Laterality Date   CARDIAC CATHETERIZATION     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   CABG x 4 [place]   Patient Active Problem List   Diagnosis Date Noted   Low back pain 09/06/2021   Gait disturbance 09/06/2021   Generalized weakness 09/06/2021   Hiccup 08/10/2021   Diabetes ([place]) 06/24/2021   Non-Hodgkin's lymphoma ([place]) 06/24/2021   Pleural effusion on right 05/13/2021   Insomnia 03/08/2021   Chronic heart failure with preserved ejection fraction ([place]) 01/28/2021   Coronary artery disease involving native heart without angina pectoris 01/28/2021   CLL (chronic lymphocytic leukemia) ([place]) 03/04/2020   Erectile dysfunction 01/10/2020   Dysphagia 04/16/2019   PAD (peripheral artery disease) ([place]) 07/07/2017   Diabetic peripheral neuropathy associated with type 2 diabetes mellitus ([place]) 07/07/2017   Knee pain, chronic 06/15/2017   Adhesive capsulitis of right shoulder 08/19/2015   Bursitis of right shoulder 06/01/2015   Allergic rhinitis 06/01/2015   Acid reflux 05/13/2015   Type 2 diabetes mellitus with diabetic retinopathy ([place]) 05/13/2015   Essential hypertension 05/13/2015    Hyperlipidemia associated with type 2 diabetes mellitus ([place]) 05/13/2015    PCP: Lattie Haw, MD  REFERRING PROVIDER: Zenia Resides, MD  REFERRING DIAG: Gait disturbance, diabetic neuropathy, Non- Hodgkins lymphoma  THERAPY DIAG:  Other abnormalities of gait and mobility  Muscle weakness (generalized)  Unsteadiness on feet  Bilateral low back pain with sciatica, sciatica laterality unspecified, unspecified chronicity  ONSET DATE: September 2022   SUBJECTIVE:  SUBJECTIVE STATEMENT: I had an heart attack in 2021. I have cancer now- lymphoma. I started having problems about 3 months ago with my walking and falling.  I noticed after my heart attack, I started being weaker.  I have had back pain since I fell down in September 2022.  I woke up and I came down steps in the apartment and I fell down when it was dark.  I called my wife and I tried to treat my back with a salve.  When I walk I have trouble with my balance.  I dont go anywhere without my walker PERTINENT HISTORY:  MI in 2021,  non- hodgkins Lymphoma 2020, DM and neuropathy, balance issues, CAD, DM retinopathy, GERD, Hyperlipidemia  HTN, CLL  PAIN:  Are you having pain? Yes VAS scale: 3/10  At worst  10/10 Pain location: low back Pain orientation:  low back and radiating at times into R leg PAIN TYPE: sub acute and chronic Pain description: dull and aching  Aggravating factors: when I try to lift some things and get out of bed Relieving factors: rest, not moving. Spends a lot of time sitting  PRECAUTIONS: Fall  WEIGHT BEARING RESTRICTIONS No  FALLS:  Has patient fallen in last 6 months? Yes, Number of falls: 1   LIVING ENVIRONMENT: Lives with: lives with their family and lives with their spouse Lives in:  House/apartment Stairs: Yes; Internal: 15 steps; Rail on left going up Has following equipment at home: Quad cane large base  OCCUPATION: retired  PLOF: Independent with household mobility with device must use device when up right    PATIENT GOALS I want to be stronger. I would like to go to [place] on my own   OBJECTIVE:   DIAGNOSTIC FINDINGS:  None for back,    PATIENT SURVEYS:  FOTO 30% intake  predicted 43%  SCREENING FOR RED FLAGS: Bowel or bladder incontinence: No Spinal tumors: No Cauda equina syndrome: No Compression fracture: No Abdominal aneurysm: No  COGNITION:  Overall cognitive status: Within functional limits for tasks assessed     SENSATION:  Light touch: Appears intact  Stereognosis: Appears intact  Hot/Cold: Appears intact  Proprioception: Appears intact except pt with DM neuropathy   MUSCLE LENGTH: Hamstrings: Right 50 deg; Left 60 deg Thomas test: Right 20 deg; Left 20 deg  POSTURE:  Pt with sarcopenic in appearance, flexed posture, forward head and rounded shoulders  PALPATION: TTP over low back bil paraspinals  mostly Quadratus lumborum  LUMBARAROM/PROM  A/PROM A/PROM  11/16/2021  Flexion WFL  Extension WFL  Right lateral flexion 50%  Left lateral flexion WFL  Right rotation [facility]  Left rotation 50%   (Blank rows = not tested)  LE AROM/PROM:  A/PROM Right 11/16/2021 Left 11/16/2021  Hip flexion 70 70  Hip extension    Hip abduction    Hip adduction    Hip internal rotation    Hip external rotation    Knee flexion 125 128  Knee extension -5 -5  Ankle dorsiflexion 6 8  Ankle plantarflexion    Ankle inversion    Ankle eversion     (Blank rows = not tested)  LE MMT:  MMT Right 11/16/2021 Left 11/16/2021  Hip flexion 3-/5 4-/5  Hip extension 3-/5 3-/5  Hip abduction 3-/5 3-/5  Hip adduction    Hip internal rotation    Hip external rotation    Knee flexion 4/5 4/5  Knee extension 4-/5 4-/5  Ankle dorsiflexion 4-/5  4-/5  Ankle plantarflexion 3/5 3/5  Ankle inversion    Ankle eversion     (Blank rows = not tested)  LUMBAR SPECIAL TESTS:  Straight leg raise test: Negative, Slump test: Negative, and Single leg stance test: Negative, Pt unable to SLS > 3 sec R or L  FUNCTIONAL TESTS:  5 times sit to stand: 0 x in 30 seconds   GAIT: Distance walked: 150 feet Assistive device utilized: Quad cane large baseGait velocity 1.52 ft /sec   Level of assistance: CGA Comments: Pt with wide based  gait with decreased ankle dorsiflexion and decreased hip flexion. Consistent with generalized weakness     TODAY'S TREATMENT  Pt and wife educated on generalized weakness due to disease and also lack of movement. Importance of exercise and strength for base level of function for toileting and self care.  Pt wife taught how to do safe squats at home with chair.   PATIENT EDUCATION:  Education details: Education of need for exercise and proper nutrition including protein for muscle building, need for walker for safe gait Person educated: Patient and Spouse Education method: Explanation, Demonstration, Tactile cues, and Verbal cues Education comprehension: verbalized understanding and returned demonstration   HOME EXERCISE PROGRAM: Sit to stand 3 x a day 10 x throughout the day. Demo in clinic Pt will get a walker for safe gait at medical store with RX  ASSESSMENT:  CLINICAL IMPRESSION: Patient is a 70 y.o. male presents with CC of gait disturbance who was seen today for physical therapy evaluation and treatment for balance /unsteadiness on feet and low back pain.  Pt with history of a fall 3 months ago in which he hurt his back a year after he returned from a heart MI CABG surgery for MI in 2021.  His fall he reports early in the morning and he was walking down steps in the dark.  He reports only one fall. Pt with unsteadiness and improper use of large base quad cane and 1.52 ft/sec gait velocity.  Pt was unable  to perform sit to stand with out heavy dependence of use of hands and unable to rise to standing without use of hands.  He reported needing to hold onto a shopping cart  while shopping  He is at great risk of falls and his BERG 31/56 indicates a need for use of walker full time . Pt is 100% at risk for fall due to inability to rise from chair without UE support.  Pt will benefit form skilled PT to address deficits and to improve current level of function.  Pt is dependent on public transportation and PT called medical supply company to check on ability of company to deliver walker to home . Pt was left message to contact medical supply   Objective impairments include Abnormal gait, decreased activity tolerance, decreased balance, decreased coordination, decreased endurance, decreased knowledge of use of DME, decreased mobility, difficulty walking, decreased ROM, decreased strength, decreased safety awareness, impaired flexibility, improper body mechanics, postural dysfunction, pain, and fall risk . These impairments are limiting patient from community activity, shopping, and traveling .   Personal factors including MI in 2021,  non- hodgkins Lymphoma 2020, DM and neuropathy, balance issues, CAD, DM retinopathy, GERD, Hyperlipidemia  HTN, CLL are also affecting patient's functional outcome. Patient will benefit from skilled PT to address above impairments and improve overall function.  REHAB POTENTIAL: Fair    CLINICAL DECISION MAKING: Evolving/moderate complexity  EVALUATION COMPLEXITY: Moderate   GOALS: Goals reviewed with patient?  Yes  SHORT TERM GOALS:  STG Name Target Date Goal status  1 Pt will be independent with initial HEP Baseline: no knowledge 12/14/2021 INITIAL  2 Pt will be educated on proper use of DME/ walker for safe ambulation Baseline:  12/14/2021 INITIAL  3 Pt will be able to perform sit to stand with minimal use of hands Baseline:Pt unable to rise from chair without heavy use  of hands 12/14/2021 INITIAL   LONG TERM GOALS:   LTG Name Target Date Goal status  1 Pt will be independent with advanced HEP Baseline: no knowledge 01/11/2022 INITIAL  2 Pt will demonstrate Sit to stand without use of hands and 5 x STS in less than 15 sec to show decrease in risk of fall Baseline: pt unable to perform sit to stand without heavy dependence on UE 01/11/2022 INITIAL  3 Pt will be able to use LRAD to walk for 1073ft  with supervision to show increase endurance  Baseline: 150 ft with large base quad cane and need fo CGAx 1 01/11/2022 INITIAL  4 Pt will be able to go shopping and walk/shop hold onto grocery cart with 50% dependence of UE on shopping cart Baseline: Pt now avoids going shopping due to unsteadiness of gait 01/11/2022 INITIAL  5 Pt will be able to don pants and perform ADL with at least 50% greater ease  and with only S from spouse/ caregiver Baseline: Pt is dependent on wife to dress and perform ADL's presently 01/11/2022 INITIAL  6 FOTO will improve from  30%  intake to 43%    indicating improved functional mobility .  Baseline:Eval 30% 01/11/2022 INITIAL   PLAN: PT FREQUENCY: 2x/week  PT DURATION: 8 weeks  PLANNED INTERVENTIONS: Therapeutic exercises, Therapeutic activity, Neuro Muscular re-education, Balance training, Gait training, Patient/Family education, Joint mobilization, Stair training, DME instructions, Dry Needling, Electrical stimulation, Spinal mobilization, Cryotherapy, Moist heat, Taping, Traction, Ionotophoresis 4mg /ml Dexamethasone, and Manual therapy  PLAN FOR NEXT SESSION: Issue HEP, Gait training with walker, work on transfers with minimal use of hands   Voncille Lo, PT, [facility] Certified Exercise Expert for the Aging Adult  11/16/21 5:07 PM Phone: [phone] Fax: [phone]    BERG BALANCE  Sitting to Standing: Numbers; 0-4: 2  4. Stands without using hands and stabilize independently  3. Stands independently using hands  2.  Stands using hands after multiple trials  1. Min A to stand  0. Mod-Max A to stand Standing unsupported: Numbers; 0-4: 3  4. Stands safely for 2 minutes  3. Stands 2 minutes with supervision  2. Stands 30 seconds unsupported  1. Needs several tries to stand unsupported for 30 seconds  0. Unable to stand unsupported for 30 seconds Sitting unsupported: Numbers; 0-4: 4  4. Sits for 2 minutes independently  3. Sits for 2 minutes with supervision  2. Able to sit 30 seconds  1. Able to sit 10 seconds  0. Unable to sit for 10 seconds Standing to Sitting: Numbers; 0-4: 3 4. Sits safely with minimal use of hands 3. Controls descent with hands 2. Uses back of legs against chair to control descent 1. Sits independently, but uncontrolled descent 0. Needs assistance Transfers: Numbers; 0-4: 3  4. Transfers safely with minor use of hands  3. Transfers safely definite use of hands  2. Transfers with verbal cueing/supervision  1. Needs 1 person assist  0. Needs 2 person assist  Standing with eyes closed: Numbers; 0-4: 3  4. Stands safely for 10 seconds  3. Stands 10 seconds with supervision   2. Able to stand for 3 seconds  1. Unable to keep eyes closed for 3 seconds, but is safe  0. Needs assist to keep from falling Standing with feet together: Numbers; 0-4: 2 4. Stands for 1 minute safely 3. Stands for 1 minute with supervision 2. Unable to hold for 30 seconds  1. Needs help to attain position but can hold for 15 seconds  0. Needs help to attain position and unable to hold for 15 seconds Reaching forward with outstretched arm: Numbers; 0-4: 2  4. Reaches forward 10 inches  3. Reaches forward 5 inches  2. Reaches forward 2 inches  1. Reaches forward with supervision  0. Loses balance/requires assistace Retrieving object from the floor: Numbers; 0-4: 2 4. Able to pick up easily and safely 3. Able to pick up with supervision 2. Unable to pick up, but reaches within 1-2 inches  independently 1. Unable to pick up and needs supervision 0. Unable/needs assistance to keep from falling  Turning to look behind: Numbers; 0-4: 2  4. Looks behind from both sides and weight shifts well  3. Looks behind one side only, other side less weight shift  2. Turns sideways only, maintains balance  1. Needs supervision when turning  0. Needs assistance  Turning 360 degrees: Numbers; 0-4: 2  4. Able to turn in </=4 seconds  3. Able to turn on one side in </= 4 seconds   2. Able to turn slowly, but safely  1. Needs supervision or verbal cueing  0. Needs assistance Place alternate foot on stool: Numbers; 0-4: 1 4. Completes 8 steps in 20 seconds 3. Completes 8 steps in >20 seconds 2. 4 steps without assistance/supervision 1. Completes >2 steps with minimal assist 0. Unable, needs assist to keep from falling Standing with one foot in front: Numbers; 0-4: 1  4. Independent tandem for 30 seconds  3. Independent foot ahead for 30 seconds  2. Independent small step for 30 seconds  1. Needs help to step, but can hold for 15 seconds  0. Loses balance while standing/stepping Standing on one foot: Numbers; 0-4: 1 4. Holds >10 seconds 3. Holds 5-10 seconds 2. Holds >/=3 seconds  1. Holds <3 seconds 0. Unable   Total Score: 31/56   Voncille Lo, PT, [facility] Certified Exercise Expert for the Aging Adult  11/16/21 5:07 PM Phone: [phone] Fax: [phone]

## 2021-11-21 ENCOUNTER — Encounter: Payer: Self-pay | Admitting: Hematology

## 2021-11-23 ENCOUNTER — Encounter: Payer: Self-pay | Admitting: Hematology

## 2021-11-25 ENCOUNTER — Other Ambulatory Visit: Payer: Self-pay

## 2021-11-25 ENCOUNTER — Encounter: Payer: Self-pay | Admitting: Physical Therapy

## 2021-11-25 ENCOUNTER — Ambulatory Visit: Payer: 59 | Attending: Family Medicine | Admitting: Physical Therapy

## 2021-11-25 DIAGNOSIS — R2689 Other abnormalities of gait and mobility: Secondary | ICD-10-CM | POA: Diagnosis present

## 2021-11-25 DIAGNOSIS — R2681 Unsteadiness on feet: Secondary | ICD-10-CM

## 2021-11-25 DIAGNOSIS — M5440 Lumbago with sciatica, unspecified side: Secondary | ICD-10-CM

## 2021-11-25 DIAGNOSIS — M6281 Muscle weakness (generalized): Secondary | ICD-10-CM

## 2021-11-25 NOTE — Therapy (Addendum)
OUTPATIENT PHYSICAL THERAPY TREATMENT NOTE/Discharge Note   Patient Name: Alec York MRN: [id number] DOB:March 15, 1951, 71 y.o., male Today's Date: 11/25/2021 PHYSICAL THERAPY DISCHARGE SUMMARY  Visits from Start of Care: 2  Current functional level related to goals / functional outcomes: unknown   Remaining deficits: unknown   Education / Equipment: Iniital HEP   Patient agrees to discharge. Patient goals were not met. Patient is being discharged due to not returning since the last visit. Alec York, PT, [place] Certified Exercise Expert for the Aging Adult  01/25/22 8:00 AM Phone: [phone] Fax: [phone]   PCP: Lattie Haw, MD REFERRING PROVIDER: Zenia Resides, MD   PT End of Session - 11/25/21 1012     Visit Number 2    Number of Visits 16    Date for PT Re-Evaluation 01/11/22    Authorization Type [employer] MCR/ MCD secondary    Progress Note Due on Visit 10    PT Start Time 1015    PT Stop Time 1100    PT Time Calculation (min) 45 min             Past Medical History:  Diagnosis Date   Allergy    Coronary artery disease    Diabetes mellitus without complication ([place])    Diabetic retinopathy ([place])    Disabled 2/2 retinopathy   GERD (gastroesophageal reflux disease)    Hyperlipidemia    Hypertension    Past Surgical History:  Procedure Laterality Date   CARDIAC CATHETERIZATION     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   CABG x 4 [place]   Patient Active Problem List   Diagnosis Date Noted   Low back pain 09/06/2021   Gait disturbance 09/06/2021   Generalized weakness 09/06/2021   Hiccup 08/10/2021   Diabetes ([place]) 06/24/2021   Non-Hodgkin's lymphoma ([place]) 06/24/2021   Pleural effusion on right 05/13/2021   Insomnia 03/08/2021   Chronic heart failure with preserved ejection fraction ([place]) 01/28/2021   Coronary artery disease involving native heart without angina pectoris 01/28/2021   CLL (chronic lymphocytic leukemia) ([place])  03/04/2020   Erectile dysfunction 01/10/2020   Dysphagia 04/16/2019   PAD (peripheral artery disease) ([place]) 07/07/2017   Diabetic peripheral neuropathy associated with type 2 diabetes mellitus ([place]) 07/07/2017   Knee pain, chronic 06/15/2017   Adhesive capsulitis of right shoulder 08/19/2015   Bursitis of right shoulder 06/01/2015   Allergic rhinitis 06/01/2015   Acid reflux 05/13/2015   Type 2 diabetes mellitus with diabetic retinopathy ([place]) 05/13/2015   Essential hypertension 05/13/2015   Hyperlipidemia associated with type 2 diabetes mellitus ([place]) 05/13/2015    REFERRING DIAG: Gait disturbance, diabetic neuropathy, Non- Hodgkins lymphoma   ONSET DATE: September 2022    THERAPY DIAG:  Other abnormalities of gait and mobility  Muscle weakness (generalized)  Bilateral low back pain with sciatica, sciatica laterality unspecified, unspecified chronicity  Unsteadiness on feet  PERTINENT HISTORY: MI in 2021,  non- hodgkins Lymphoma 2020, DM and neuropathy, balance issues, CAD, DM retinopathy, GERD, Hyperlipidemia  HTN, CLL   PRECAUTIONS: Fall   WEIGHT BEARING RESTRICTIONS No  SUBJECTIVE: A little pain today in the back. My left shoulder hurts when I sleep on the left side.   PAIN:  Are you having pain? Yes VAS scale: 3/10  At worst  10/10 Pain location: low back Pain orientation:  low back and radiating at times into R leg PAIN TYPE: sub acute and chronic Pain description: dull and aching  Aggravating factors: when I try  to lift some things and get out of bed Relieving factors: rest, not moving. Spends a lot of time sitting     OBJECTIVE: Taken 11/16/21 unless otherwise noted.    DIAGNOSTIC FINDINGS:  None for back,     PATIENT SURVEYS:  FOTO 30% intake  predicted 43%   SCREENING FOR RED FLAGS: Bowel or bladder incontinence: No Spinal tumors: No Cauda equina syndrome: No Compression fracture: No Abdominal aneurysm: No   COGNITION:          Overall  cognitive status: Within functional limits for tasks assessed                        SENSATION:          Light touch: Appears intact          Stereognosis: Appears intact          Hot/Cold: Appears intact          Proprioception: Appears intact except pt with DM neuropathy    MUSCLE LENGTH: Hamstrings: Right 50 deg; Left 60 deg Thomas test: Right 20 deg; Left 20 deg   POSTURE:  Pt with sarcopenic in appearance, flexed posture, forward head and rounded shoulders   PALPATION: TTP over low back bil paraspinals  mostly Quadratus lumborum   LUMBARAROM/PROM   A/PROM A/PROM  11/16/2021  Flexion WFL  Extension WFL  Right lateral flexion 50%  Left lateral flexion WFL  Right rotation [facility]  Left rotation 50%   (Blank rows = not tested)   LE AROM/PROM:   A/PROM Right 11/16/2021 Left 11/16/2021  Hip flexion 70 70  Hip extension      Hip abduction      Hip adduction      Hip internal rotation      Hip external rotation      Knee flexion 125 128  Knee extension -5 -5  Ankle dorsiflexion 6 8  Ankle plantarflexion      Ankle inversion      Ankle eversion       (Blank rows = not tested)   LE MMT:   MMT Right 11/16/2021 Left 11/16/2021  Hip flexion 3-/5 4-/5  Hip extension 3-/5 3-/5  Hip abduction 3-/5 3-/5  Hip adduction      Hip internal rotation      Hip external rotation      Knee flexion 4/5 4/5  Knee extension 4-/5 4-/5  Ankle dorsiflexion 4-/5 4-/5  Ankle plantarflexion 3/5 3/5  Ankle inversion      Ankle eversion       (Blank rows = not tested)   LUMBAR SPECIAL TESTS: 11/16/21 Straight leg raise test: Negative, Slump test: Negative, and Single leg stance test: Negative, Pt unable to SLS > 3 sec R or L   FUNCTIONAL TESTS: 11/16/21 5 times sit to stand: 0 x in 30 seconds     GAIT:11/16/21 Distance walked: 150 feet Assistive device utilized: Quad cane large baseGait velocity 1.52 ft /sec   Level of assistance: CGA Comments: Pt with wide based  gait with  decreased ankle dorsiflexion and decreased hip flexion. Consistent with generalized weakness    TODAY's TREATMENT: 11/25/21  -Gait in clinic with RW 368f with min cues for safety with transfers.   -sit-stand using UE to rise, no UE to lower x 10  -standing squat at RW x 10  -standing march , alternating 10 x 2, at RW  -standing hip abduction 10 x 2 at  RW  -standing heel raise 10 x 2. Toe raise 10 x 2  -Bridge 10 x 2  -SKTC 30 sec x 2  -Lower trunk rotations x10   Initial TREATMENT 11/16/21 Pt and wife educated on generalized weakness due to disease and also lack of movement. Importance of exercise and strength for base level of function for toileting and self care.  Pt wife taught how to do safe squats at home with chair.     PATIENT EDUCATION:  Education details: Engineer, materials with RW and transfers, HEP issued Person educated: Patient and Spouse Education method: Explanation, Demonstration, Tactile cues, and Verbal cues, handout Education comprehension: verbalized understanding and returned demonstration     HOME EXERCISE PROGRAM: Access Code: [email or web address] URL: [email or web address] Date: 11/25/2021 Prepared by: Hessie Diener  Exercises Sit to Stand with Counter Support - 2 x daily - 7 x weekly - 2 sets - 10 reps Heel Raises with Counter Support - 2 x daily - 7 x weekly - 2 sets - 10 reps Standing March with Counter Support - 2 x daily - 7 x weekly - 2 sets - 10 reps Standing hip abduction with counter support - 2 x daily - 7 x weekly - 2 sets - 10 reps Supine Bridge - 2 x daily - 7 x weekly - 2 sets - 10 reps Supine Lower Trunk Rotation - 1 x daily - 7 x weekly - 2 sets - 10 reps Knee to chest stretch - 1 x daily - 7 x weekly - 1 sets - 3 reps - 30 hold    ASSESSMENT:   CLINICAL IMPRESSION: Pt arrives with wife and [facility]. He did receive a RW but opted not to bring it today. He was encouraged to bring it on his future visits. Used the clinic RW to check safety with gait  and transfers and he demonstrates good safety with min cues for transfer sit-stand. Established HEP and educated patient and wife on safety while performing HEP. Pt tolerated session well with rest breaks as needed.    REHAB POTENTIAL: Fair     CLINICAL DECISION MAKING: Evolving/moderate complexity   EVALUATION COMPLEXITY: Moderate     GOALS: Goals reviewed with patient? Yes   SHORT TERM GOALS:   STG Name Target Date Goal status  1 Pt will be independent with initial HEP Baseline: no knowledge 12/14/2021 INITIAL  2 Pt will be educated on proper use of DME/ walker for safe ambulation Baseline:  12/14/2021 INITIAL  3 Pt will be able to perform sit to stand with minimal use of hands Baseline:Pt unable to rise from chair without heavy use of hands 12/14/2021 INITIAL    LONG TERM GOALS:    LTG Name Target Date Goal status  1 Pt will be independent with advanced HEP Baseline: no knowledge 01/11/2022 INITIAL  2 Pt will demonstrate Sit to stand without use of hands and 5 x STS in less than 15 sec to show decrease in risk of fall Baseline: pt unable to perform sit to stand without heavy dependence on UE 01/11/2022 INITIAL  3 Pt will be able to use LRAD to walk for 1080f  with supervision to show increase endurance  Baseline: 150 ft with large base quad cane and need fo CGAx 1 01/11/2022 INITIAL  4 Pt will be able to go shopping and walk/shop hold onto grocery cart with 50% dependence of UE on shopping cart Baseline: Pt now avoids going shopping due to unsteadiness of gait 01/11/2022 INITIAL  5 Pt  will be able to don pants and perform ADL with at least 50% greater ease  and with only S from spouse/ caregiver Baseline: Pt is dependent on wife to dress and perform ADL's presently 01/11/2022 INITIAL  6 FOTO will improve from  30%  intake to 43%    indicating improved functional mobility .  Baseline:Eval 30% 01/11/2022 INITIAL    PLAN: PT FREQUENCY: 2x/week   PT DURATION: 8 weeks   PLANNED  INTERVENTIONS: Therapeutic exercises, Therapeutic activity, Neuro Muscular re-education, Balance training, Gait training, Patient/Family education, Joint mobilization, Stair training, DME instructions, Dry Needling, Electrical stimulation, Spinal mobilization, Cryotherapy, Moist heat, Taping, Traction, Ionotophoresis 81m/ml Dexamethasone, and Manual therapy   PLAN FOR NEXT SESSION: review HEP, Gait training with walker, work on transfers with minimal use of hands        JHessie Diener PTA 11/25/21 10:57 AM Phone: [phone]: [phone]

## 2021-11-29 ENCOUNTER — Other Ambulatory Visit: Payer: Self-pay | Admitting: Family Medicine

## 2021-11-29 DIAGNOSIS — E11311 Type 2 diabetes mellitus with unspecified diabetic retinopathy with macular edema: Secondary | ICD-10-CM

## 2021-11-29 DIAGNOSIS — I2510 Atherosclerotic heart disease of native coronary artery without angina pectoris: Secondary | ICD-10-CM

## 2021-11-30 ENCOUNTER — Ambulatory Visit: Payer: 59 | Admitting: Physical Therapy

## 2021-11-30 ENCOUNTER — Other Ambulatory Visit: Payer: Self-pay | Admitting: Family Medicine

## 2021-11-30 DIAGNOSIS — E11311 Type 2 diabetes mellitus with unspecified diabetic retinopathy with macular edema: Secondary | ICD-10-CM

## 2021-11-30 DIAGNOSIS — Z794 Long term (current) use of insulin: Secondary | ICD-10-CM

## 2021-12-02 NOTE — Progress Notes (Signed)
° ° ° °  SUBJECTIVE:   CHIEF COMPLAINT / HPI:   Alec York is a 71 y.o. male presents for diabetes check  Diabetes Patient's current diabetic medications include Jardiance, metformin, trulicty and tresiba. Tolerating well without side effects.  Patient endorses compliance with these medications. Patient's last A1c was  Lab Results  Component Value Date   HGBA1C 7.8 (A) 12/03/2021   HGBA1C 9.6 (A) 08/10/2021   HGBA1C 11.0 (A) 05/13/2021   Denies abdominal pain, blurred vision, polyuria, polydipsia, hypoglycemia. Patient states they understand that diet and exercise can help with her diabetes.  Last Microalbumin, LDL, Creatinine: Lab Results  Component Value Date   MICROALBUR 30 03/22/2018   LDLCALC 35 12/03/2021   CREATININE 0.72 (L) 12/03/2021   Flowsheet Row Office Visit from 12/03/2021 in [facility]  PHQ-9 Total Score 0        PERTINENT  PMH / PSH: HTN, CAD, PAD, T2DM  OBJECTIVE:   BP (!) 123/59    Pulse (!) 53    Ht 5\' 8"  (1.727 m)    Wt 127 lb (57.6 kg)    SpO2 100%    BMI 19.31 kg/m    General: Alert, no acute distress, frail male Cardio: Normal S1 and S2, RRR, no r/m/g Pulm: CTAB, normal work of breathing Abdomen: Bowel sounds normal. Abdomen soft and non-tender.  Extremities: No peripheral edema.  Neuro: Cranial nerves grossly intact   ASSESSMENT/PLAN:   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) A1c 7.8. At goal. Congratulated pt. No changes to meds made today.  Essential hypertension BP at goal. Was on lisinopril last year but stopped last year by cardiology due to hypotension. Continue metoprolol.  Health maintenance examination Received prevnar 20 vaccine today. Recommended to get shingles and covid booster at pharmacy.    Lattie Haw, MD PGY-3 [place]

## 2021-12-03 ENCOUNTER — Other Ambulatory Visit: Payer: Self-pay

## 2021-12-03 ENCOUNTER — Ambulatory Visit (INDEPENDENT_AMBULATORY_CARE_PROVIDER_SITE_OTHER): Payer: 59

## 2021-12-03 ENCOUNTER — Ambulatory Visit (INDEPENDENT_AMBULATORY_CARE_PROVIDER_SITE_OTHER): Payer: 59 | Admitting: Family Medicine

## 2021-12-03 ENCOUNTER — Encounter: Payer: Self-pay | Admitting: Family Medicine

## 2021-12-03 VITALS — BP 123/59 | HR 53 | Ht 68.0 in | Wt 127.0 lb

## 2021-12-03 DIAGNOSIS — E11311 Type 2 diabetes mellitus with unspecified diabetic retinopathy with macular edema: Secondary | ICD-10-CM | POA: Diagnosis not present

## 2021-12-03 DIAGNOSIS — Z23 Encounter for immunization: Secondary | ICD-10-CM

## 2021-12-03 DIAGNOSIS — Z794 Long term (current) use of insulin: Secondary | ICD-10-CM

## 2021-12-03 DIAGNOSIS — Z0000 Encounter for general adult medical examination without abnormal findings: Secondary | ICD-10-CM

## 2021-12-03 DIAGNOSIS — E785 Hyperlipidemia, unspecified: Secondary | ICD-10-CM

## 2021-12-03 DIAGNOSIS — I10 Essential (primary) hypertension: Secondary | ICD-10-CM

## 2021-12-03 LAB — POCT GLYCOSYLATED HEMOGLOBIN (HGB A1C): HbA1c, POC (controlled diabetic range): 7.8 % — AB (ref 0.0–7.0)

## 2021-12-03 MED ORDER — TRESIBA FLEXTOUCH 100 UNIT/ML ~~LOC~~ SOPN: PEN_INJECTOR | SUBCUTANEOUS | 3 refills | Status: DC

## 2021-12-03 MED ORDER — TRULICITY 0.75 MG/0.5ML ~~LOC~~ SOAJ: 0.7500 mg | SUBCUTANEOUS | 3 refills | Status: DC

## 2021-12-03 NOTE — Patient Instructions (Addendum)
Thank you for coming to see me today. It was a pleasure. Today we discussed your diabetes. It looks perfect. No changes made to medications. Sent in refills.     We can discuss changing your BP medication next time.  Please follow-up with me 1-2 months.  Get shingles and covid booster at pharmacy.  If you have any questions or concerns, please do not hesitate to call the office at [phone].  Best wishes,   Dr Posey Pronto

## 2021-12-04 ENCOUNTER — Encounter: Payer: Self-pay | Admitting: Hematology

## 2021-12-04 LAB — LIPID PANEL
Chol/HDL Ratio: 2.3 ratio (ref 0.0–5.0)
Cholesterol, Total: 86 mg/dL — ABNORMAL LOW (ref 100–199)
HDL: 37 mg/dL — ABNORMAL LOW (ref >39)
LDL Chol Calc (NIH): 35 mg/dL (ref 0–99)
Triglycerides: 61 mg/dL (ref 0–149)
VLDL Cholesterol Cal: 14 mg/dL (ref 5–40)

## 2021-12-04 LAB — BASIC METABOLIC PANEL
BUN/Creatinine Ratio: 17 (ref 10–24)
BUN: 12 mg/dL (ref 8–27)
CO2: 23 mmol/L (ref 20–29)
Calcium: 9.2 mg/dL (ref 8.6–10.2)
Chloride: 102 mmol/L (ref 96–106)
Creatinine, Ser: 0.72 mg/dL — ABNORMAL LOW (ref 0.76–1.27)
Glucose: 111 mg/dL — ABNORMAL HIGH (ref 70–99)
Potassium: 4.5 mmol/L (ref 3.5–5.2)
Sodium: 139 mmol/L (ref 134–144)
eGFR: 100 mL/min/{1.73_m2} (ref >59)

## 2021-12-07 ENCOUNTER — Telehealth: Payer: Self-pay | Admitting: Physical Therapy

## 2021-12-07 ENCOUNTER — Ambulatory Visit: Payer: 59 | Admitting: Physical Therapy

## 2021-12-07 DIAGNOSIS — Z0000 Encounter for general adult medical examination without abnormal findings: Secondary | ICD-10-CM | POA: Insufficient documentation

## 2021-12-07 NOTE — Telephone Encounter (Signed)
Patient was a no show for his 9:300 appt today.  Called the patient and left a message regarding this, reminded him of his next appt as well as the cancellation and no show policy.  Raeford Razor, PT 12/07/21 9:57 AM Phone: [phone] Fax: [phone]

## 2021-12-07 NOTE — Assessment & Plan Note (Signed)
A1c 7.8. At goal. Congratulated pt. No changes to meds made today.

## 2021-12-07 NOTE — Assessment & Plan Note (Addendum)
BP at goal. Was on lisinopril last year but stopped last year by cardiology due to hypotension. Continue metoprolol.

## 2021-12-07 NOTE — Assessment & Plan Note (Addendum)
Received prevnar 20 vaccine today. Recommended to get shingles and covid booster at pharmacy.

## 2021-12-14 ENCOUNTER — Telehealth: Payer: Self-pay | Admitting: Physical Therapy

## 2021-12-14 ENCOUNTER — Ambulatory Visit: Payer: 59 | Admitting: Physical Therapy

## 2021-12-14 NOTE — Telephone Encounter (Signed)
Contacted patient regarding no show to appointment today. He reported that he was having eye trouble and that he will call back to get rescheduled.

## 2021-12-24 ENCOUNTER — Telehealth: Payer: Self-pay | Admitting: Hematology

## 2021-12-24 NOTE — Telephone Encounter (Signed)
Patient called requesting an appointment with Dr. Irene Limbo this month and requests to keep his March appointment. Scheduled appointment.

## 2022-01-06 ENCOUNTER — Other Ambulatory Visit: Payer: Self-pay

## 2022-01-06 DIAGNOSIS — C8590 Non-Hodgkin lymphoma, unspecified, unspecified site: Secondary | ICD-10-CM

## 2022-01-07 ENCOUNTER — Inpatient Hospital Stay: Payer: 59

## 2022-01-07 ENCOUNTER — Other Ambulatory Visit: Payer: Self-pay

## 2022-01-07 ENCOUNTER — Inpatient Hospital Stay: Payer: 59 | Attending: Hematology | Admitting: Hematology

## 2022-01-07 VITALS — BP 130/66 | HR 79 | Temp 97.9°F | Resp 18 | Ht 68.0 in | Wt 126.6 lb

## 2022-01-07 DIAGNOSIS — Z87891 Personal history of nicotine dependence: Secondary | ICD-10-CM | POA: Insufficient documentation

## 2022-01-07 DIAGNOSIS — C8590 Non-Hodgkin lymphoma, unspecified, unspecified site: Secondary | ICD-10-CM

## 2022-01-07 DIAGNOSIS — Z794 Long term (current) use of insulin: Secondary | ICD-10-CM | POA: Diagnosis not present

## 2022-01-07 DIAGNOSIS — C8591 Non-Hodgkin lymphoma, unspecified, lymph nodes of head, face, and neck: Secondary | ICD-10-CM | POA: Insufficient documentation

## 2022-01-07 DIAGNOSIS — E119 Type 2 diabetes mellitus without complications: Secondary | ICD-10-CM | POA: Diagnosis not present

## 2022-01-07 LAB — CBC WITH DIFFERENTIAL (CANCER CENTER ONLY)
Abs Immature Granulocytes: 0.02 10*3/uL (ref 0.00–0.07)
Basophils Absolute: 0.1 10*3/uL (ref 0.0–0.1)
Basophils Relative: 1 %
Eosinophils Absolute: 0.4 10*3/uL (ref 0.0–0.5)
Eosinophils Relative: 3 %
HCT: 39.1 % (ref 39.0–52.0)
Hemoglobin: 12.4 g/dL — ABNORMAL LOW (ref 13.0–17.0)
Immature Granulocytes: 0 %
Lymphocytes Relative: 31 %
Lymphs Abs: 3.5 10*3/uL (ref 0.7–4.0)
MCH: 25.7 pg — ABNORMAL LOW (ref 26.0–34.0)
MCHC: 31.7 g/dL (ref 30.0–36.0)
MCV: 81 fL (ref 80.0–100.0)
Monocytes Absolute: 1.1 10*3/uL — ABNORMAL HIGH (ref 0.1–1.0)
Monocytes Relative: 9 %
Neutro Abs: 6.2 10*3/uL (ref 1.7–7.7)
Neutrophils Relative %: 56 %
Platelet Count: 256 10*3/uL (ref 150–400)
RBC: 4.83 MIL/uL (ref 4.22–5.81)
RDW: 15.3 % (ref 11.5–15.5)
WBC Count: 11.2 10*3/uL — ABNORMAL HIGH (ref 4.0–10.5)
nRBC: 0 % (ref 0.0–0.2)

## 2022-01-07 LAB — CMP (CANCER CENTER ONLY)
ALT: 10 U/L (ref 0–44)
AST: 14 U/L — ABNORMAL LOW (ref 15–41)
Albumin: 4.2 g/dL (ref 3.5–5.0)
Alkaline Phosphatase: 74 U/L (ref 38–126)
Anion gap: 5 (ref 5–15)
BUN: 20 mg/dL (ref 8–23)
CO2: 30 mmol/L (ref 22–32)
Calcium: 10 mg/dL (ref 8.9–10.3)
Chloride: 102 mmol/L (ref 98–111)
Creatinine: 0.88 mg/dL (ref 0.61–1.24)
GFR, Estimated: >60 mL/min (ref >60)
Glucose, Bld: 148 mg/dL — ABNORMAL HIGH (ref 70–99)
Potassium: 4.6 mmol/L (ref 3.5–5.1)
Sodium: 137 mmol/L (ref 135–145)
Total Bilirubin: 0.3 mg/dL (ref 0.3–1.2)
Total Protein: 7.9 g/dL (ref 6.5–8.1)

## 2022-01-07 LAB — LACTATE DEHYDROGENASE: LDH: 121 U/L (ref 98–192)

## 2022-01-11 ENCOUNTER — Telehealth: Payer: Self-pay | Admitting: Hematology

## 2022-01-11 ENCOUNTER — Encounter: Payer: Self-pay | Admitting: Hematology

## 2022-01-11 NOTE — Progress Notes (Signed)
HEMATOLOGY/ONCOLOGY CLNIC NOTE  Date of Service: 01/07/2022   Patient Care Team: Lattie Haw, MD as PCP - General (Family Medicine)  Melissa Montane, MD as ENT  CHIEF COMPLAINTS/PURPOSE OF CONSULTATION:  Non-Hodgkin's Lymphoma   CURRENT TREATMENT: Watchful observation  PREVIOUS TREATMENT Rituxan weekly x 4 doses  INTERVAL HISTORY:   Alec York is here for continued evaluation and management of his nodal marginal zone lymphoma. He notes he has been feeling well and has gained about 4 to 5 pounds since his last clinic visit. He notes no acute new symptoms. No fevers no chills no night sweats no unexpected weight loss. Notes no new lumps or bumps. No new chest pain shortness of breath abdominal pain or distention. He notes his diabetes has been under better control as well. Labs done today were reviewed  MEDICAL HISTORY:  Past Medical History:  Diagnosis Date   Allergy    Coronary artery disease    Diabetes mellitus without complication (HCC)    Diabetic retinopathy ([place])    Disabled 2/2 retinopathy   GERD (gastroesophageal reflux disease)    Hyperlipidemia    Hypertension     SURGICAL HISTORY: Past Surgical History:  Procedure Laterality Date   CARDIAC CATHETERIZATION     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   CABG x 4 [place]    SOCIAL HISTORY: Social History   Socioeconomic History   Marital status: Married    Spouse name: Not on file   Number of children: 2   Years of education: Not on file   Highest education level: Not on file  Occupational History   Occupation: Retired  Tobacco Use   Smoking status: Former    Packs/day: 0.50    Years: 10.00    Pack years: 5.00    Types: Cigarettes    Start date: 1971    Quit date: 2010    Years since quitting: 13.1   Smokeless tobacco: Never  Vaping Use   Vaping Use: Never used  Substance and Sexual Activity   Alcohol use: No    Alcohol/week: 0.0 standard drinks   Drug use: No   Sexual  activity: Yes    Birth control/protection: None  Other Topics Concern   Not on file  Social History Narrative   Not on file   Social Determinants of Health   Financial Resource Strain: Not on file  Food Insecurity: Not on file  Transportation Needs: Not on file  Physical Activity: Not on file  Stress: Not on file  Social Connections: Not on file  Intimate Partner Violence: Not on file    FAMILY HISTORY: Family History  Problem Relation Age of Onset   Heart disease Mother    Diabetes Neg Hx     ALLERGIES:  has No Known Allergies.  MEDICATIONS:  Current Outpatient Medications  Medication Sig Dispense Refill   Accu-Chek FastClix Lancets MISC Use to test sugars up to 4 times daily.  Dx Code: E11.319 102 each 12   Accu-Chek Softclix Lancets lancets Use as instructed 100 each 12   aspirin EC 81 MG tablet Take 1 tablet (81 mg total) by mouth daily. Swallow whole. 90 tablet 3   b complex vitamins capsule Take 1 capsule by mouth daily.     blood glucose meter kit and supplies KIT Dispense based on patient and insurance preference. Use up to four times daily as directed. Dx E11.319 1 each 0   Blood Glucose Monitoring Suppl (ACCU-CHEK GUIDE) w/Device KIT 1  each by Does not apply route 4 (four) times daily as needed. Use to test sugars up to 4 times daily.  Dx Code: E11.319 1 kit 0   Blood Pressure Monitoring (BLOOD PRESSURE CUFF) MISC 1 Units by Does not apply route daily. 1 each 0   cholecalciferol (VITAMIN D3) 25 MCG (1000 UNIT) tablet Take 2,000 Units by mouth daily.     diclofenac Sodium (VOLTAREN) 1 % GEL Apply 2 g topically 4 (four) times daily. 150 g 3   Dulaglutide (TRULICITY) 2.75 TZ/0.0FV SOPN Inject 0.75 mg into the skin once a week. 4 mL 3   glucose blood (ACCU-CHEK GUIDE) test strip Use to test sugars up to 4 times daily.  Dx Code: E11.319 200 each 12   insulin degludec (TRESIBA FLEXTOUCH) 100 UNIT/ML FlexTouch Pen INJECT 10 UNITS SUBCUTANEOUSLY ONCE DAILY 15 mL 3    Insulin Pen Needle (RELION PEN NEEDLES) 31G X 6 MM MISC USE AS DIRECTED TO  CHECK  BLOOD  SUGAR 50 each 23   iron polysaccharides (NIFEREX) 150 MG capsule Take 1 capsule (150 mg total) by mouth daily. 30 capsule 5   JARDIANCE 25 MG TABS tablet Take 1 tablet by mouth once daily 90 tablet 0   Lancet Devices (ACCU-CHEK SOFTCLIX) lancets Use as instructed up to 4 times daily.  Dx E11.319 400 each 3   Lancets Misc. (ACCU-CHEK FASTCLIX LANCET) KIT Use to test sugars up to 4 times daily.  Dx Code: E11.319 1 kit 0   LORazepam (ATIVAN) 0.5 MG tablet Take 1 tablet (0.5 mg total) by mouth at bedtime as needed for anxiety. (Patient not taking: Reported on 08/10/2021) 20 tablet 0   metFORMIN (GLUCOPHAGE) 1000 MG tablet Take 1 tablet by mouth twice daily 180 tablet 0   metoprolol succinate (TOPROL-XL) 50 MG 24 hr tablet TAKE 1 TABLET BY MOUTH AT BEDTIME TAKE  WITH  OR  IMMEDIATELY  FOLLOWING  A  MEAL 90 tablet 0   nitroGLYCERIN (NITROSTAT) 0.4 MG SL tablet Place 1 tablet (0.4 mg total) under the tongue every 5 (five) minutes as needed for chest pain. (Patient not taking: Reported on 08/10/2021) 30 tablet 12   pantoprazole (PROTONIX) 40 MG tablet Take 1 tablet (40 mg total) by mouth daily before breakfast. (Patient not taking: Reported on 11/16/2021) 30 tablet 0   rosuvastatin (CRESTOR) 10 MG tablet Take 1 tablet by mouth once daily 90 tablet 0   vitamin B-12 (CYANOCOBALAMIN) 500 MCG tablet Take 500 mcg by mouth daily.     No current facility-administered medications for this visit.    REVIEW OF SYSTEMS:   10 Point review of Systems was done is negative except as noted above.   PHYSICAL EXAMINATION: ECOG FS:1 - Symptomatic but completely ambulatory Vital signs reviewed Wt Readings from Last 3 Encounters:  01/07/22 126 lb 9.6 oz (57.4 kg)  12/03/21 127 lb (57.6 kg)  09/24/21 122 lb (55.3 kg)  NAD GENERAL:alert, in no acute distress and comfortable SKIN: no acute rashes, no significant lesions EYES:  conjunctiva are pink and non-injected, sclera anicteric OROPHARYNX: MMM, no exudates, no oropharyngeal erythema or ulceration NECK: supple, no JVD LYMPH:  no palpable lymphadenopathy in the axillary or inguinal regions, minimal borderline enlarged bilateral cervical lymph nodes that are unchanged in size. LUNGS: clear to auscultation b/l with normal respiratory effort HEART: regular rate & rhythm ABDOMEN:  normoactive bowel sounds , non tender, not distended.  Palpable hepatosplenomegaly Extremity: no pedal edema PSYCH: alert & oriented x 3 with  fluent speech NEURO: no focal motor/sensory deficits    LABORATORY DATA:  I have reviewed the data as listed  . CBC Latest Ref Rng & Units 01/07/2022 09/24/2021 07/27/2021  WBC 4.0 - 10.5 K/uL 11.2(H) 10.8(H) 9.5  Hemoglobin 13.0 - 17.0 g/dL 12.4(L) 11.2(L) 12.2(L)  Hematocrit 39.0 - 52.0 % 39.1 36.7(L) 38.5(L)  Platelets 150 - 400 K/uL 256 254 245   .CBC    Component Value Date/Time   WBC 11.2 (H) 01/07/2022 0856   WBC 10.8 (H) 09/24/2021 0854   RBC 4.83 01/07/2022 0856   HGB 12.4 (L) 01/07/2022 0856   HGB 12.3 (L) 03/22/2018 0941   HCT 39.1 01/07/2022 0856   HCT 38.9 03/22/2018 0941   PLT 256 01/07/2022 0856   PLT 235 03/22/2018 0941   MCV 81.0 01/07/2022 0856   MCV 85 03/22/2018 0941   MCH 25.7 (L) 01/07/2022 0856   MCHC 31.7 01/07/2022 0856   RDW 15.3 01/07/2022 0856   RDW 13.5 03/22/2018 0941   LYMPHSABS 3.5 01/07/2022 0856   LYMPHSABS 6.3 (H) 03/22/2018 0941   MONOABS 1.1 (H) 01/07/2022 0856   EOSABS 0.4 01/07/2022 0856   EOSABS 0.2 03/22/2018 0941   BASOSABS 0.1 01/07/2022 0856   BASOSABS 0.0 03/22/2018 0941     . CMP Latest Ref Rng & Units 01/07/2022 12/03/2021 09/24/2021  Glucose 70 - 99 mg/dL 148(H) 111(H) 253(H)  BUN 8 - 23 mg/dL _0 Creatinine 0.61 - 1.24 mg/dL 0.88 0.72(L) 0.87  Sodium 135 - 145 mmol/L 137 139 137  Potassium 3.5 - 5.1 mmol/L 4.6 4.5 4.6  Chloride 98 - 111 mmol/L 102 102 102  CO2 22 - 32  mmol/L _1 Calcium 8.9 - 10.3 mg/dL 10.0 9.2 9.3  Total Protein 6.5 - 8.1 g/dL 7.9 - 7.4  Total Bilirubin 0.3 - 1.2 mg/dL 0.3 - 0.3  Alkaline Phos 38 - 126 U/L 74 - 94  AST 15 - 41 U/L 14(L) - 12(L)  ALT 0 - 44 U/L 10 - 8   . Lab Results  Component Value Date   LDH 121 01/07/2022    06/18/18 Right Cervical LN Needle/core Biopsy:    05/23/18 Fine Needle Aspiration Flow Cytometry:    RADIOGRAPHIC STUDIES: I have personally reviewed the radiological images as listed and agreed with the findings in the report. No results found.  ASSESSMENT & PLAN:   71 y.o. male with  1. 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 Benign behavior is not established. Bulky adenopathy such as this could relate to lymphoma, or metastatic squamous cell carcinoma. There is no visible extranodal spread of tumor.  -06/18/18 biopsy favored a low grade Marginal Zone Lymphoma -06/15/18 PET/CT revealed Enlarged and hypermetabolic lymph nodes involving the neck, axilla, subpectoral and mediastinal lymph nodes. No lymphadenopathy below the diaphragm. No findings for osseous lymphoma -06/07/18 ECHO for treatment planning revealed a normal ejection fraction -02/02/21 PET/CT showed unchanged mediastinal lymphadenopathy with similar hypermetabolic activity. Interval development of moderate right and small left pleural effusion R > L.  -06/07/2021 Underwent thoracentesis with removal of 200 ml of straw colored fluid. Cytology revealed predominantly lymphocytic (95%) with 8-9% B-cells present.  This is suggestive of involvement of pleural fluid by lymphoma.    PLAN: -Labs done today reviewed with the patient in detail CBC within normal limits hemoglobin of 12.4 WBC count of 11.2k with no increased lymphocytes and normal platelets of 256k LDH within normal limits at 121 CMP stable blood sugar 148  #2 generalized anxiety related to family stressors with the loss of his son about a year ago and significant trouble medical interventions including CABG and lymphoma treatment. Plan -Patient notes he is feeling less anxious since completing his treatment and since he is feeling better. -He intends to travel to [place] for a few months and will let us know if he does to reschedule his appointment.  #3  Diabetes type 2 -Patient notes improved blood sugar control. Continue follow-up with primary care physician for continued management of his diabetes Follow-up  RTC with Dr Irene Limbo with labs in 4 months    Sullivan Lone MD [place] [facility] Physician [facility]  .

## 2022-01-11 NOTE — Telephone Encounter (Signed)
Scheduled follow-up appointment per 2/17 los. Patient is aware. Mailed calendar.

## 2022-01-12 ENCOUNTER — Other Ambulatory Visit: Payer: Self-pay

## 2022-01-12 ENCOUNTER — Ambulatory Visit (INDEPENDENT_AMBULATORY_CARE_PROVIDER_SITE_OTHER): Payer: 59

## 2022-01-12 DIAGNOSIS — Z0000 Encounter for general adult medical examination without abnormal findings: Secondary | ICD-10-CM | POA: Diagnosis not present

## 2022-01-12 NOTE — Progress Notes (Signed)
Subjective:   Alec York is a 71 y.o. male who presents for Medicare Annual/Subsequent preventive examination.  The patient consented to a virtual visit. Patient consented to have virtual visit and was identified by name and date of birth. Method of visit: Telephone  Encounter participants: Patient: Alec York - located at Home Nurse/Provider: Dorna Bloom - located at [facility] Others (if applicable): NA  Review of Systems: Defer to PCP  Cardiac Risk Factors include: advanced age (>58mn, >>74women);diabetes mellitus;male gender;hypertension  Objective:   Vitals: There were no vitals taken for this visit.  There is no height or weight on file to calculate BMI.  Advanced Directives 01/12/2022 11/16/2021 08/10/2021 06/24/2021 04/09/2021 03/08/2021 01/12/2021  Does Patient Have a Medical Advance Directive? _0  No No  Would patient like information on creating a medical advance directive? Yes (MAU/Ambulatory/Procedural Areas - Information given) No - Patient declined No - Patient declined No - Patient declined No - Patient declined No - Patient declined No - Patient declined   Tobacco Social History   Tobacco Use  Smoking Status Former   Packs/day: 0.50   Years: 10.00   Pack years: 5.00   Types: Cigarettes   Start date: [birth year]  Quit date: 2010   Years since quitting: 13.1   Passive exposure: Past  Smokeless Tobacco Never     Counseling given: No plans to restart.  Clinical Intake:  Pre-visit preparation completed: Yes  Pain Score: 0-No pain  How often do you need to have someone help you when you read instructions, pamphlets, or other written materials from your doctor or pharmacy?: 3 - Sometimes What is the last grade level you completed in school[place] lHuman resources officerNeeded?: No  Past Medical History:  Diagnosis Date   Allergy    Coronary artery disease    Diabetes mellitus without complication ([place]     [place]    Disabled 2/2 retinopathy   GERD (gastroesophageal reflux disease)    Hyperlipidemia    Hypertension    Past Surgical History:  Procedure Laterality Date   CARDIAC CATHETERIZATION     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   [place]ABG x 4 [place]  Family History  Problem Relation Age of Onset   Heart disease Mother    Heart disease Father    Heart disease Brother    Hypertension Brother    Diabetes Brother    Social History   Socioeconomic History   Marital status: Married    Spouse name: Alec York   Number of children: 2   Years of education: 16   Highest education level: Bachelor's degree (e.g., BA, AB, BS)  Occupational History   Occupation: Retired  Tobacco Use   Smoking status: Former    Packs/day: 0.50    Years: 10.00    Pack years: 5.00    Types: Cigarettes    Start date: 21   Quit date: 2010    Years since quitting: 13.1    Passive exposure: Past   Smokeless tobacco: Never  Vaping Use   Vaping Use: Never used  Substance and Sexual Activity   Alcohol use: No    Alcohol/week: 0.0 standard drinks   Drug use: No   Sexual activity: Yes    Birth control/protection: None  Other Topics Concern   Not on file  Social History Narrative   [facility] [employer] [facility]  Patient has 2 children, one [place] and one [place].    Patient uses [employer] transportation and Medicare transportation.    College in [place].   Social Determinants of Health   Financial Resource Strain: Low Risk    Difficulty of Paying Living Expenses: Not hard at all  Food Insecurity: No Food Insecurity   Worried About Charity fundraiser in the Last Year: Never true   [place] in the Last Year: Never true  Transportation Needs: No Transportation Needs   Lack of Transportation (Medical): No   Lack of Transportation (Non-Medical): No  Physical Activity: Insufficiently Active   Days of Exercise per Week: 7 days   Minutes of Exercise per  Session: 10 min  Stress: No Stress Concern Present   Feeling of Stress : Not at all  Social Connections: Moderately Isolated   Frequency of Communication with Friends and Family: Twice a week   Frequency of Social Gatherings with Friends and Family: More than three times a week   Attends Religious Services: Never   Marine scientist or Organizations: No   Attends Archivist Meetings: Never   Marital Status: Married   Outpatient Encounter Medications as of 01/12/2022  Medication Sig   aspirin EC 81 MG tablet Take 1 tablet (81 mg total) by mouth daily. Swallow whole.   b complex vitamins capsule Take 1 capsule by mouth daily.   blood glucose meter kit and supplies KIT Dispense based on patient and insurance preference. Use up to four times daily as directed. 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 Use to test sugars up to 4 times daily.  Dx Code: E11.319   Blood Pressure Monitoring (BLOOD PRESSURE CUFF) MISC 1 Units by Does not apply route daily.   cholecalciferol (VITAMIN D3) 25 MCG (1000 UNIT) tablet Take 2,000 Units by mouth daily.   Dulaglutide (TRULICITY) 5.36 IW/8.0HO SOPN Inject 0.75 mg into the skin once a week.   insulin degludec (TRESIBA FLEXTOUCH) 100 UNIT/ML FlexTouch Pen INJECT 10 UNITS SUBCUTANEOUSLY ONCE DAILY   Insulin Pen Needle (RELION PEN NEEDLES) 31G X 6 MM MISC USE AS DIRECTED TO  CHECK  BLOOD  SUGAR   iron polysaccharides (NIFEREX) 150 MG capsule Take 1 capsule (150 mg total) by mouth daily.   JARDIANCE 25 MG TABS tablet Take 1 tablet by mouth once daily   metFORMIN (GLUCOPHAGE) 1000 MG tablet Take 1 tablet by mouth twice daily   metoprolol succinate (TOPROL-XL) 50 MG 24 hr tablet TAKE 1 TABLET BY MOUTH AT BEDTIME TAKE  WITH  OR  IMMEDIATELY  FOLLOWING  A  MEAL   nitroGLYCERIN (NITROSTAT) 0.4 MG SL tablet Place 1 tablet (0.4 mg total) under the tongue every 5  (five) minutes as needed for chest pain.   rosuvastatin (CRESTOR) 10 MG tablet Take 1 tablet by mouth once daily   vitamin B-12 (CYANOCOBALAMIN) 500 MCG tablet Take 500 mcg by mouth daily.   Accu-Chek FastClix Lancets MISC Use to test sugars up to 4 times daily.  Dx Code: E11.319   Accu-Chek Softclix Lancets lancets Use as instructed   diclofenac Sodium (VOLTAREN) 1 % GEL Apply 2 g topically 4 (four) times daily. (Patient not taking: Reported on 01/12/2022)   glucose blood (ACCU-CHEK GUIDE) test strip Use to test sugars up to 4 times daily.  Dx Code: E11.319   Lancet Devices (ACCU-CHEK [place])  lancets Use as instructed up to 4 times daily.  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 (ACCU-CHEK FASTCLIX LANCET) KIT Use to test sugars up to 4 times daily.  Dx Code: E11.319   LORazepam (ATIVAN) 0.5 MG tablet Take 1 tablet (0.5 mg total) by mouth at bedtime as needed for anxiety. (Patient not taking: Reported on 08/10/2021)   pantoprazole (PROTONIX) 40 MG tablet Take 1 tablet (40 mg total) by mouth daily before breakfast. (Patient not taking: Reported on 11/16/2021)   No facility-administered encounter medications on file as of 01/12/2022.   Activities of Daily Living In your present state of health, do you have any difficulty performing the following activities: 01/12/2022  Hearing? N  Vision? Y  Difficulty concentrating or making decisions? N  Walking or climbing stairs? Y  Dressing or bathing? N  Doing errands, shopping? Y  Preparing Food and eating ? N  Using the Toilet? N  In the past six months, have you accidently leaked urine? N  Do you have problems with loss of bowel control? N  Managing your Medications? N  Managing your Finances? N  Housekeeping or managing your Housekeeping? N  Some recent data might be hidden   Patient Care Team: Lattie Haw, MD as PCP - General (Family Medicine) O'Neal, Cassie Freer, MD as Consulting Physician (Cardiology) Brunetta Genera, MD as Consulting  Physician (Hematology) Margaretha Seeds, MD as Consulting Physician (Pulmonary Disease)   Assessment:   This is a routine wellness examination for [place].  Exercise Activities and Dietary recommendations Current Exercise Habits: Home exercise routine, Type of exercise: stretching;strength training/weights, Time (Minutes): 10, Frequency (Times/Week): 7, Weekly Exercise (Minutes/Week): 70, Intensity: Mild, Exercise limited by: cardiac condition(s)   Goals      HEMOGLOBIN A1C < 8     7.8-January 2023       Fall Risk Fall Risk  01/12/2022 08/10/2021 06/24/2021 03/08/2021 01/10/2020  Falls in the past year? 1 0 0 0 0  Number falls in past yr: 0 0 0 0 0  Injury with Fall? 1 0 0 0 0  Risk for fall due to : Impaired balance/gait - - - -  Follow up Falls prevention discussed - - - -   Patient does report abnormal gait and balance. Patient does use a walker to ambulate.   Is the patient's home free of loose throw rugs in walkways, pet beds, electrical cords, etc?   yes      Grab bars in the bathroom? yes      Handrails on the stairs?   yes      Adequate lighting?   yes  Depression Screen PHQ 2/9 Scores 01/12/2022 12/03/2021 09/06/2021 08/10/2021  PHQ - 2 Score 0 0 0 0  PHQ- 9 Score 0 0 - 0  Exception Documentation - - - -   Cognitive Function 6CIT Screen 01/12/2022  What Year? 0 points  What month? 0 points  What time? 0 points  Count back from 20 0 points  Months in reverse 0 points  Repeat phrase 2 points  Total Score 2   Immunization History  Administered Date(s) Administered   Fluad Quad(high Dose 65+) 08/10/2021   Influenza, High Dose Seasonal PF 08/05/2018, 08/24/2019   Influenza,inj,Quad PF,6+ Mos 08/19/2015, 12/22/2016   Influenza-Unspecified 08/24/2019   PFIZER Comirnaty(Gray Top)Covid-19 Tri-Sucrose Vaccine 01/07/2021   PFIZER(Purple Top)SARS-COV-2 Vaccination 01/19/2020, 02/12/2020   PNEUMOCOCCAL CONJUGATE-20 12/03/2021   Pfizer Covid-19 Vaccine Bivalent Booster  83yr & up 12/03/2021   Pneumococcal Conjugate-13 08/10/2018  Pneumococcal Polysaccharide-23 12/22/2016   Tdap 02/17/2017   Zoster, Live 02/17/2017   Qualifies for Shingles Vaccine? Yes   Shingrix Completed: No, Education has been provided regarding the importance of this vaccine. Advised may receive this vaccine at local pharmacy or Health Dept. Aware to provide a copy of the vaccination record if obtained from local pharmacy or Health Dept. Verbalized acceptance and understanding.  Screening Tests Health Maintenance  Topic Date Due   Zoster Vaccines- Shingrix (1 of 2) Never done   OPHTHALMOLOGY EXAM  05/02/2019   URINE MICROALBUMIN  09/24/2020   FOOT EXAM  12/09/2020   HEMOGLOBIN A1C  06/02/2022   COLONOSCOPY (Pts 45-13yr Insurance coverage will need to be confirmed)  03/21/2025   TETANUS/TDAP  02/18/2027   Pneumonia Vaccine 71 Years old  Completed   INFLUENZA VACCINE  Completed   COVID-19 Vaccine  Completed   Hepatitis C Screening  Completed   HPV VACCINES  Aged Out   Cancer Screenings: Lung: Low Dose CT Chest recommended if Age 69-80years, 30 pack-year currently smoking OR have quit w/in 15years. Patient does not qualify. Colorectal: UTD  Additional Screenings: Hepatitis C Screening: Completed  HIV Screening: Completed  Plan:  PCP apt scheduled for 4/14 for diabetes management.  Fill out advance directive. Continue doing exercises you learned at physical therapy.   I have personally reviewed and noted the following in the patients chart:   Medical and social history Use of alcohol, tobacco or illicit drugs  Current medications and supplements Functional ability and status Nutritional status Physical activity Advanced directives List of other physicians Hospitalizations, surgeries, and ER visits in previous 12 months Vitals Screenings to include cognitive, depression, and falls Referrals and appointments  In addition, I have reviewed and discussed with  patient certain preventive protocols, quality metrics, and best practice recommendations. A written personalized care plan for preventive services as well as general preventive health recommendations were provided to patient.  This visit was conducted virtually in the setting of the [place].   EDorna Bloom [place] 01/18/2022

## 2022-01-18 NOTE — Progress Notes (Addendum)
I have reviewed this visit and agree with the documentation.   

## 2022-01-18 NOTE — Patient Instructions (Signed)
You spoke to Alec York, Alec York over the phone for your annual wellness visit.  We discussed goals:   Goals      HEMOGLOBIN A1C < 8     7.8-January 2023       We also discussed recommended health maintenance. As discussed, you are due for: Health Maintenance  Topic Date Due   Zoster Vaccines- Shingrix (1 of 2) Never done   OPHTHALMOLOGY EXAM  05/02/2019   URINE MICROALBUMIN  09/24/2020   FOOT EXAM  12/09/2020   HEMOGLOBIN A1C  06/02/2022   COLONOSCOPY (Pts 45-68yrs Insurance coverage will need to be confirmed)  03/21/2025   TETANUS/TDAP  02/18/2027   Pneumonia Vaccine 45+ Years old  Completed   INFLUENZA VACCINE  Completed   COVID-19 Vaccine  Completed   Hepatitis C Screening  Completed   HPV VACCINES  Aged Out   PCP apt scheduled for 4/14 for diabetes management.  Fill out advance directive. Continue doing exercises you learned at physical therapy.   Preventive Care 105 Years and Older, Male Preventive care refers to lifestyle choices and visits with your health care provider that can promote health and wellness. Preventive care visits are also called wellness exams. What can I expect for my preventive care visit? Counseling During your preventive care visit, your health care provider may ask about your: Medical history, including: Past medical problems. Family medical history. History of falls. Current health, including: Emotional well-being. Home life and relationship well-being. Sexual activity. Memory and ability to understand (cognition). Lifestyle, including: Alcohol, nicotine or tobacco, and drug use. Access to firearms. Diet, exercise, and sleep habits. Work and work Statistician. Sunscreen use. Safety issues such as seatbelt and bike helmet use. Physical exam Your health care provider will check your: Height and weight. These may be used to calculate your BMI (body mass index). BMI is a measurement that tells if you are at a healthy weight. Waist  circumference. This measures the distance around your waistline. This measurement also tells if you are at a healthy weight and may help predict your risk of certain diseases, such as type 2 diabetes and high blood pressure. Heart rate and blood pressure. Body temperature. Skin for abnormal spots. What immunizations do I need? Vaccines are usually given at various ages, according to a schedule. Your health care provider will recommend vaccines for you based on your age, medical history, and lifestyle or other factors, such as travel or where you work. What tests do I need? Screening Your health care provider may recommend screening tests for certain conditions. This may include: Lipid and cholesterol levels. Diabetes screening. This is done by checking your blood sugar (glucose) after you have not eaten for a while (fasting). Hepatitis C test. Hepatitis B test. HIV (human immunodeficiency virus) test. STI (sexually transmitted infection) testing, if you are at risk. Lung cancer screening. Colorectal cancer screening. Prostate cancer screening. Abdominal aortic aneurysm (AAA) screening. You may need this if you are a current or former smoker. Talk with your health care provider about your test results, treatment options, and if necessary, the need for more tests. Follow these instructions at home: Eating and drinking  Eat a diet that includes fresh fruits and vegetables, whole grains, lean protein, and low-fat dairy products. Limit your intake of foods with high amounts of sugar, saturated fats, and salt. Take vitamin and mineral supplements as recommended by your health care provider. Do not drink alcohol if your health care provider tells you not to drink. If  you drink alcohol: Limit how much you have to 0-2 drinks a day. Know how much alcohol is in your drink. In the [place] one drink equals one 12 oz bottle of beer (355 mL), one 5 oz glass of wine (148 mL), or one 1 oz glass of hard  liquor (44 mL). Lifestyle Brush your teeth every morning and night with fluoride toothpaste. Floss one time each day. Exercise for at least 30 minutes 5 or more days each week. Do not use any products that contain nicotine or tobacco. These products include cigarettes, chewing tobacco, and vaping devices, such as e-cigarettes. If you need help quitting, ask your health care provider. Do not use drugs. If you are sexually active, practice safe sex. Use a condom or other form of protection to prevent STIs. Take aspirin only as told by your health care provider. Make sure that you understand how much to take and what form to take. Work with your health care provider to find out whether it is safe and beneficial for you to take aspirin daily. Ask your health care provider if you need to take a cholesterol-lowering medicine (statin). Find healthy ways to manage stress, such as: Meditation, yoga, or listening to music. Journaling. Talking to a trusted person. Spending time with friends and family. Safety Always wear your seat belt while driving or riding in a vehicle. Do not drive: If you have been drinking alcohol. Do not ride with someone who has been drinking. When you are tired or distracted. While texting. If you have been using any mind-altering substances or drugs. Wear a helmet and other protective equipment during sports activities. If you have firearms in your house, make sure you follow all gun safety procedures. Minimize exposure to UV radiation to reduce your risk of skin cancer. What's next? Visit your health care provider once a year for an annual wellness visit. Ask your health care provider how often you should have your eyes and teeth checked. Stay up to date on all vaccines. This information is not intended to replace advice given to you by your health care provider. Make sure you discuss any questions you have with your health care provider. Document Revised: 05/05/2021  Document Reviewed: 05/05/2021 Elsevier Patient Education  2022 [place] Prevention in the Home, Adult Falls can cause injuries and can happen to people of all ages. There are many things you can do to make your home safe and to help prevent falls. Ask for help when making these changes. What actions can I take to prevent falls? General Instructions Use good lighting in all rooms. Replace any light bulbs that burn out. Turn on the lights in dark areas. Use night-lights. Keep items that you use often in easy-to-reach places. Lower the shelves around your home if needed. Set up your furniture so you have a clear path. Avoid moving your furniture around. Do not have throw rugs or other things on the floor that can make you trip. Avoid walking on wet floors. If any of your floors are uneven, fix them. Add color or contrast paint or tape to clearly mark and help you see: Grab bars or handrails. First and last steps of staircases. Where the edge of each step is. If you use a stepladder: Make sure that it is fully opened. Do not climb a closed stepladder. Make sure the sides of the stepladder are locked in place. Ask someone to hold the stepladder while you use it. Know where your pets are  when moving through your home. What can I do in the bathroom?   Keep the floor dry. Clean up any water on the floor right away. Remove soap buildup in the tub or shower. Use nonskid mats or decals on the floor of the tub or shower. Attach bath mats securely with double-sided, nonslip rug tape. If you need to sit down in the shower, use a plastic, nonslip stool. Install grab bars by the toilet and in the tub and shower. Do not use towel bars as grab bars. What can I do in the bedroom? Make sure that you have a light by your bed that is easy to reach. Do not use any sheets or blankets for your bed that hang to the floor. Have a firm chair with side arms that you can use for support when you get  dressed. What can I do in the kitchen? Clean up any spills right away. If you need to reach something above you, use a step stool with a grab bar. Keep electrical cords out of the way. Do not use floor polish or wax that makes floors slippery. What can I do with my stairs? Do not leave any items on the stairs. Make sure that you have a light switch at the top and the bottom of the stairs. Make sure that there are handrails on both sides of the stairs. Fix handrails that are broken or loose. Install nonslip stair treads on all your stairs. Avoid having throw rugs at the top or bottom of the stairs. Choose a carpet that does not hide the edge of the steps on the stairs. Check carpeting to make sure that it is firmly attached to the stairs. Fix carpet that is loose or worn. What can I do on the outside of my home? Use bright outdoor lighting. Fix the edges of walkways and driveways and fix any cracks. Remove anything that might make you trip as you walk through a door, such as a raised step or threshold. Trim any bushes or trees on paths to your home. Check to see if handrails are loose or broken and that both sides of all steps have handrails. Install guardrails along the edges of any raised decks and porches. Clear paths of anything that can make you trip, such as tools or rocks. Have leaves, snow, or ice cleared regularly. Use sand or salt on paths during winter. Clean up any spills in your garage right away. This includes grease or oil spills. What other actions can I take? Wear shoes that: Have a low heel. Do not wear high heels. Have rubber bottoms. Feel good on your feet and fit well. Are closed at the toe. Do not wear open-toe sandals. Use tools that help you move around if needed. These include: Canes. Walkers. Scooters. Crutches. Review your medicines with your doctor. Some medicines can make you feel dizzy. This can increase your chance of falling. Ask your doctor what  else you can do to help prevent falls. Where to find more information Centers for Disease Control and Prevention, [employer]: http://www.wolf.info/ National Institute on Aging: http://kim-miller.com/ Contact a doctor if: You are afraid of falling at home. You feel weak, drowsy, or dizzy at home. You fall at home. Summary There are many simple things that you can do to make your home safe and to help prevent falls. Ways to make your home safe include removing things that can make you trip and installing grab bars in the bathroom. Ask for help when  making these changes in your home. This information is not intended to replace advice given to you by your health care provider. Make sure you discuss any questions you have with your health care provider. Document Revised: 06/10/2020 Document Reviewed: 06/10/2020 Elsevier Patient Education  2022 [employer].   Our clinic's number is [phone]. Please call with questions or concerns about what we discussed today.

## 2022-01-21 ENCOUNTER — Inpatient Hospital Stay: Payer: 59 | Admitting: Hematology

## 2022-01-21 ENCOUNTER — Inpatient Hospital Stay: Payer: 59

## 2022-03-03 NOTE — Progress Notes (Signed)
? ? ? ?  SUBJECTIVE:  ? ?CHIEF COMPLAINT / HPI:  ? ?Alec York is a 71 y.o. male presents for diabetes check up  ? ? ?Hypertension ?Patient's current antihypertensive  medications include: metoprolol. Compliant with medications and tolerating well without side effects.  Checking BP at home with readings between 118-120 and 65-70.  Denies any SOB, CP, vision changes, LE edema, medication SEs, or symptoms of hypotension.  ? ?Most recent creatinine trend:  ?Lab Results  ?Component Value Date  ? CREATININE 0.88 01/07/2022  ? CREATININE 0.72 (L) 12/03/2021  ? CREATININE 0.87 09/24/2021  ?  ? ?Patient has had a BMP in the past 1 year. ? ?Diabetes ?Patient's current diabetic medications include trulicity, jardiance, metformin, tresiba 10 units. Tolerating well without side effects.  Patient endorses compliance with these medications. CBG readings averaging in the 150-190 range.  Patient's last A1c was  ?Lab Results  ?Component Value Date  ? HGBA1C 7.4 (A) 03/04/2022  ? HGBA1C 7.8 (A) 12/03/2021  ? HGBA1C 9.6 (A) 08/10/2021  ?Denies abdominal pain, blurred vision, polyuria, polydipsia, hypoglycemia. Patient states they understand that diet and exercise can help with her diabetes.   ? ?Last Microalbumin, LDL, Creatinine: ?Lab Results  ?Component Value Date  ? MICROALBUR 30 03/22/2018  ? [place] 35 12/03/2021  ? CREATININE 0.88 01/07/2022  ? ? ?[place] Office Visit from 03/04/2022 in [place]  ?PHQ-9 Total Score 0  ? ?  ?  ?PERTINENT  PMH / PSH:  hx of CAD s/p CABG, HTN, HLD, DM, Lymphoma  ? ?OBJECTIVE:  ? ?BP (!) 106/58   Pulse 76   Temp 98.1 ?F (36.7 ?C) (Oral)   Resp 16   Wt 127 lb 6.4 oz (57.8 kg)   SpO2 100%   BMI 19.37 kg/m?   ? ?General: Alert, no acute distress ?Cardio: Normal S1 and S2, RRR, no r/m/g ?Pulm: CTAB, normal work of breathing ?Abdomen: Bowel sounds normal. Abdomen soft and non-tender.  ?Extremities: No peripheral edema.  ?Neuro: Cranial nerves grossly intact   ? ? ? ? ?ASSESSMENT/PLAN:  ? ?Hypertrophic scar ?Hypertrophy and pruritic scar of CABG. Prescribed claritin and hydrocortisone to see if this helps with symptoms. If no improvement could consider steroid injections. ? ?Essential hypertension ?BP remains soft, 106/58. Recommend reducing metoprolol to '25mg'$  daily. BP f/u in 1 month. ? ?Diabetic peripheral neuropathy associated with type 2 diabetes mellitus ([place]) ?A1c 7.4, at goal. Continue current meds, ?  ? ?Lattie Haw, MD PGY-3 ?[place]  ?

## 2022-03-04 ENCOUNTER — Ambulatory Visit (INDEPENDENT_AMBULATORY_CARE_PROVIDER_SITE_OTHER): Payer: 59 | Admitting: Family Medicine

## 2022-03-04 VITALS — BP 106/58 | HR 76 | Temp 98.1°F | Resp 16 | Wt 127.4 lb

## 2022-03-04 DIAGNOSIS — E1142 Type 2 diabetes mellitus with diabetic polyneuropathy: Secondary | ICD-10-CM

## 2022-03-04 DIAGNOSIS — E11319 Type 2 diabetes mellitus with unspecified diabetic retinopathy without macular edema: Secondary | ICD-10-CM

## 2022-03-04 DIAGNOSIS — E119 Type 2 diabetes mellitus without complications: Secondary | ICD-10-CM | POA: Diagnosis not present

## 2022-03-04 DIAGNOSIS — Z794 Long term (current) use of insulin: Secondary | ICD-10-CM

## 2022-03-04 DIAGNOSIS — E11311 Type 2 diabetes mellitus with unspecified diabetic retinopathy with macular edema: Secondary | ICD-10-CM | POA: Diagnosis not present

## 2022-03-04 DIAGNOSIS — L910 Hypertrophic scar: Secondary | ICD-10-CM

## 2022-03-04 DIAGNOSIS — I2510 Atherosclerotic heart disease of native coronary artery without angina pectoris: Secondary | ICD-10-CM

## 2022-03-04 DIAGNOSIS — E118 Type 2 diabetes mellitus with unspecified complications: Secondary | ICD-10-CM

## 2022-03-04 DIAGNOSIS — I10 Essential (primary) hypertension: Secondary | ICD-10-CM

## 2022-03-04 LAB — POCT GLYCOSYLATED HEMOGLOBIN (HGB A1C): HbA1c, POC (controlled diabetic range): 7.4 % — AB (ref 0.0–7.0)

## 2022-03-04 MED ORDER — TRESIBA FLEXTOUCH 100 UNIT/ML ~~LOC~~ SOPN: PEN_INJECTOR | SUBCUTANEOUS | 3 refills | Status: DC

## 2022-03-04 MED ORDER — CETIRIZINE HCL 10 MG PO TABS: 10.0000 mg | ORAL_TABLET | Freq: Every day | ORAL | 0 refills | Status: DC

## 2022-03-04 MED ORDER — EMPAGLIFLOZIN 25 MG PO TABS: 25.0000 mg | ORAL_TABLET | Freq: Every day | ORAL | 0 refills | Status: DC

## 2022-03-04 MED ORDER — ROSUVASTATIN CALCIUM 10 MG PO TABS: 10.0000 mg | ORAL_TABLET | Freq: Every day | ORAL | 0 refills | Status: DC

## 2022-03-04 MED ORDER — NITROGLYCERIN 0.4 MG SL SUBL: 0.4000 mg | SUBLINGUAL_TABLET | SUBLINGUAL | 12 refills | Status: DC | PRN

## 2022-03-04 MED ORDER — TRULICITY 0.75 MG/0.5ML ~~LOC~~ SOAJ: 0.7500 mg | SUBCUTANEOUS | 3 refills | Status: DC

## 2022-03-04 MED ORDER — RELION PEN NEEDLES 31G X 6 MM MISC: 3 refills | Status: DC

## 2022-03-04 MED ORDER — HYDROCORTISONE 0.5 % EX CREA: 1.0000 "application " | TOPICAL_CREAM | Freq: Two times a day (BID) | CUTANEOUS | 0 refills | Status: AC

## 2022-03-04 MED ORDER — METOPROLOL SUCCINATE ER 25 MG PO TB24: 25.0000 mg | ORAL_TABLET | Freq: Every day | ORAL | 2 refills | Status: DC

## 2022-03-04 NOTE — Patient Instructions (Signed)
???? ??? ???? ???? ??? ????. ??? ???? ???. ??? ??? ????? ??? ?? ?? ?????? ???? ?????? ???? ??. ??? ????? ???? ???: ???????????   $'50mg'D$  ?? ???? ????? '25mg'$ . ?????????? ????? ???? ??. ???? ???? ???? ????. ???????? ????? ???? ??. ???? ?????????? ??????? ??????? ?? ????? ????? 2-3 ??????????? ???? ???? ????-?? ??? ? ??? ???? ??? ??????? ???? ?????? ???, ?? ???? ????? ?????? [phone] ?? ??? ??????? ?????? ????. ? ??????????, ? ????? ?? ? ? ?Thank you for coming to see me today. It was a pleasure. Today we discussed your blood pressure it is low. I recommend: metoprolol '25mg'$  daily instead of '50mg'$ . ?Diabetes looks good. Continue same medications. ?Eat small meals often.  ?I have sent in refills to the pharmacy ?Please follow-up with me in 2-3 weeks  ? ?If you have any questions or concerns, please do not hesitate to call the office at [phone]. ? ?Best wishes,  ? ?Dr Posey Pronto   ?

## 2022-03-04 NOTE — Progress Notes (Signed)
Patient is here with c/o off balance, weakness, not eating well , and SOB at times. ? ?Patient would like a refill in his medications. ?

## 2022-03-06 LAB — MICROALBUMIN / CREATININE URINE RATIO
Creatinine, Urine: 20 mg/dL
Microalb/Creat Ratio: 117 mg/g creat — ABNORMAL HIGH (ref 0–29)
Microalbumin, Urine: 23.4 ug/mL

## 2022-03-10 DIAGNOSIS — L910 Hypertrophic scar: Secondary | ICD-10-CM | POA: Insufficient documentation

## 2022-03-10 NOTE — Assessment & Plan Note (Signed)
A1c 7.4, at goal. Continue current meds, ?

## 2022-03-10 NOTE — Assessment & Plan Note (Addendum)
Hypertrophy and pruritic scar of CABG. Prescribed claritin and hydrocortisone to see if this helps with symptoms. If no improvement could consider steroid injections. ?

## 2022-03-10 NOTE — Assessment & Plan Note (Signed)
BP remains soft, 106/58. Recommend reducing metoprolol to '25mg'$  daily. BP f/u in 1 month. ?

## 2022-04-22 ENCOUNTER — Ambulatory Visit: Payer: 59 | Admitting: Family Medicine

## 2022-04-27 ENCOUNTER — Other Ambulatory Visit: Payer: Self-pay | Admitting: Oncology

## 2022-05-17 ENCOUNTER — Inpatient Hospital Stay (HOSPITAL_BASED_OUTPATIENT_CLINIC_OR_DEPARTMENT_OTHER): Payer: 59 | Admitting: Hematology

## 2022-05-17 ENCOUNTER — Inpatient Hospital Stay: Payer: 59 | Attending: Hematology

## 2022-05-17 ENCOUNTER — Other Ambulatory Visit: Payer: Self-pay

## 2022-05-17 VITALS — BP 124/67 | HR 73 | Temp 97.3°F | Resp 20 | Wt 127.2 lb

## 2022-05-17 DIAGNOSIS — Z87891 Personal history of nicotine dependence: Secondary | ICD-10-CM | POA: Diagnosis not present

## 2022-05-17 DIAGNOSIS — C8590 Non-Hodgkin lymphoma, unspecified, unspecified site: Secondary | ICD-10-CM | POA: Insufficient documentation

## 2022-05-17 DIAGNOSIS — F411 Generalized anxiety disorder: Secondary | ICD-10-CM | POA: Insufficient documentation

## 2022-05-17 DIAGNOSIS — J90 Pleural effusion, not elsewhere classified: Secondary | ICD-10-CM | POA: Insufficient documentation

## 2022-05-17 DIAGNOSIS — E119 Type 2 diabetes mellitus without complications: Secondary | ICD-10-CM | POA: Diagnosis not present

## 2022-05-17 LAB — CBC WITH DIFFERENTIAL/PLATELET
Abs Immature Granulocytes: 0.03 10*3/uL (ref 0.00–0.07)
Basophils Absolute: 0.1 10*3/uL (ref 0.0–0.1)
Basophils Relative: 1 %
Eosinophils Absolute: 0.4 10*3/uL (ref 0.0–0.5)
Eosinophils Relative: 3 %
HCT: 37.5 % — ABNORMAL LOW (ref 39.0–52.0)
Hemoglobin: 12 g/dL — ABNORMAL LOW (ref 13.0–17.0)
Immature Granulocytes: 0 %
Lymphocytes Relative: 36 %
Lymphs Abs: 4 10*3/uL (ref 0.7–4.0)
MCH: 26.1 pg (ref 26.0–34.0)
MCHC: 32 g/dL (ref 30.0–36.0)
MCV: 81.5 fL (ref 80.0–100.0)
Monocytes Absolute: 1 10*3/uL (ref 0.1–1.0)
Monocytes Relative: 9 %
Neutro Abs: 5.7 10*3/uL (ref 1.7–7.7)
Neutrophils Relative %: 51 %
Platelets: 236 10*3/uL (ref 150–400)
RBC: 4.6 MIL/uL (ref 4.22–5.81)
RDW: 14.9 % (ref 11.5–15.5)
WBC: 11.1 10*3/uL — ABNORMAL HIGH (ref 4.0–10.5)
nRBC: 0 % (ref 0.0–0.2)

## 2022-05-17 LAB — CMP (CANCER CENTER ONLY)
ALT: 9 U/L (ref 0–44)
AST: 13 U/L — ABNORMAL LOW (ref 15–41)
Albumin: 4 g/dL (ref 3.5–5.0)
Alkaline Phosphatase: 81 U/L (ref 38–126)
Anion gap: 6 (ref 5–15)
BUN: 20 mg/dL (ref 8–23)
CO2: 27 mmol/L (ref 22–32)
Calcium: 9.3 mg/dL (ref 8.9–10.3)
Chloride: 104 mmol/L (ref 98–111)
Creatinine: 1 mg/dL (ref 0.61–1.24)
GFR, Estimated: >60 mL/min (ref >60)
Glucose, Bld: 157 mg/dL — ABNORMAL HIGH (ref 70–99)
Potassium: 4.6 mmol/L (ref 3.5–5.1)
Sodium: 137 mmol/L (ref 135–145)
Total Bilirubin: 0.3 mg/dL (ref 0.3–1.2)
Total Protein: 7.4 g/dL (ref 6.5–8.1)

## 2022-05-17 LAB — LACTATE DEHYDROGENASE: LDH: 120 U/L (ref 98–192)

## 2022-05-19 ENCOUNTER — Telehealth: Payer: Self-pay | Admitting: Hematology

## 2022-05-19 NOTE — Telephone Encounter (Signed)
Scheduled follow-up appointment per 6/27 los. Patient is aware.

## 2022-05-23 ENCOUNTER — Other Ambulatory Visit: Payer: 59

## 2022-05-23 ENCOUNTER — Ambulatory Visit: Payer: 59 | Admitting: Hematology

## 2022-05-23 NOTE — Progress Notes (Signed)
HEMATOLOGY/ONCOLOGY CLNIC NOTE  Date of Service: 05/17/2022   Patient Care Team: Orvis Brill, DO as PCP - General (Family Medicine) O'Neal, Cassie Freer, MD as Consulting Physician (Cardiology) Brunetta Genera, MD as Consulting Physician (Hematology) Margaretha Seeds, MD as Consulting Physician (Pulmonary Disease)  Melissa Montane, MD as ENT  CHIEF COMPLAINTS/PURPOSE OF CONSULTATION:  Follow-up for continued evaluation and management of non-Hodgkin's lymphoma  CURRENT TREATMENT: Watchful observation  PREVIOUS TREATMENT Rituxan weekly x 4 doses  INTERVAL HISTORY:   Alec York is here for continued evaluation and management of his nodal marginal zone lymphoma after his last clinic visit with [place] about 4 months ago.  He notes no acute new symptoms since his last clinic visit. No fevers no chills no night sweats no new lumps or bumps.  No unexpected weight loss. No new shortness of breath or chest pain.  No new abdominal pain or distention. Labs done today were reviewed in detail with him.   MEDICAL HISTORY:  Past Medical History:  Diagnosis Date   Allergy    Coronary artery disease    Diabetes mellitus without complication ([place])    Diabetic retinopathy ([place])    Disabled 2/2 retinopathy   GERD (gastroesophageal reflux disease)    Hyperlipidemia    Hypertension     SURGICAL HISTORY: Past Surgical History:  Procedure Laterality Date   CARDIAC CATHETERIZATION     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   CABG x 4 [place]    SOCIAL HISTORY: Social History   Socioeconomic History   Marital status: Married    Spouse name: [name]   Number of children: 2   Years of education: 16   Highest education level: Bachelor's degree (e.g., BA, AB, BS)  Occupational History   Occupation: Retired  Tobacco Use   Smoking status: Former    Packs/day: 0.50    Years: 10.00    Total pack years: 5.00    Types: Cigarettes    Start date: 34    Quit date: 2010     Years since quitting: 13.5    Passive exposure: Past   Smokeless tobacco: Never  Vaping Use   Vaping Use: Never used  Substance and Sexual Activity   Alcohol use: No    Alcohol/week: 0.0 standard drinks of alcohol   Drug use: No   Sexual activity: Yes    Birth control/protection: None  Other Topics Concern   Not on file  Social History Narrative   Patient lives with his wife [employer] [facility] patient.)    Patient has 2 children, one [place] and one [place].    Patient uses [employer] transportation and Medicare transportation.    College in [place].   Social Determinants of Health   Financial Resource Strain: Low Risk  (01/12/2022)   Overall Financial Resource Strain (CARDIA)    Difficulty of Paying Living Expenses: Not hard at all  Food Insecurity: No Food Insecurity (01/12/2022)   Hunger Vital Sign    Worried About Running Out of Food in the Last Year: Never true    Ran Out of Food in the Last Year: Never true  Transportation Needs: No Transportation Needs (01/12/2022)   PRAPARE - Hydrologist (Medical): No    Lack of Transportation (Non-Medical): No  Physical Activity: Insufficiently Active (01/12/2022)   Exercise Vital Sign    Days of Exercise per Week: 7 days    Minutes of Exercise per Session: 10 min  Stress: No Stress  Concern Present (01/12/2022)   [place]    Feeling of Stress : Not at all  Social Connections: Moderately Isolated (01/12/2022)   Social Connection and Isolation Panel [NHANES]    Frequency of Communication with Friends and Family: Twice a week    Frequency of Social Gatherings with Friends and Family: More than three times a week    Attends Religious Services: Never    Marine scientist or Organizations: No    Attends Archivist Meetings: Never    Marital Status: Married  Human resources officer Violence: Not At Risk (01/12/2022)   Humiliation, Afraid, Rape, and Kick  questionnaire    Fear of Current or Ex-Partner: No    Emotionally Abused: No    Physically Abused: No    Sexually Abused: No    FAMILY HISTORY: Family History  Problem Relation Age of Onset   Heart disease Mother    Heart disease Father    Heart disease Brother    Hypertension Brother    Diabetes Brother     ALLERGIES:  has No Known Allergies.  MEDICATIONS:  Current Outpatient Medications  Medication Sig Dispense Refill   Accu-Chek FastClix Lancets MISC Use to test sugars up to 4 times daily.  Dx Code: E11.319 102 each 12   Accu-Chek Softclix Lancets lancets Use as instructed 100 each 12   aspirin EC 81 MG tablet Take 1 tablet (81 mg total) by mouth daily. Swallow whole. 90 tablet 3   b complex vitamins capsule Take 1 capsule by mouth daily.     blood glucose meter kit and supplies KIT Dispense based on patient and insurance preference. Use up to four times daily as directed. Dx E11.319 1 each 0   Blood Glucose Monitoring Suppl (ACCU-CHEK GUIDE) w/Device KIT 1 each by Does not apply route 4 (four) times daily as needed. Use to test sugars up to 4 times daily.  Dx Code: E11.319 1 kit 0   Blood Pressure Monitoring (BLOOD PRESSURE CUFF) MISC 1 Units by Does not apply route daily. 1 each 0   cetirizine (ZYRTEC ALLERGY) 10 MG tablet Take 1 tablet (10 mg total) by mouth daily. 30 tablet 0   cholecalciferol (VITAMIN D3) 25 MCG (1000 UNIT) tablet Take 2,000 Units by mouth daily.     diclofenac Sodium (VOLTAREN) 1 % GEL Apply 2 g topically 4 (four) times daily. (Patient not taking: Reported on 01/12/2022) 150 g 3   Dulaglutide (TRULICITY) 5.42 HC/6.2BJ SOPN Inject 0.75 mg into the skin once a week. 4 mL 3   empagliflozin (JARDIANCE) 25 MG TABS tablet Take 1 tablet (25 mg total) by mouth daily. 90 tablet 0   glucose blood (ACCU-CHEK GUIDE) test strip Use to test sugars up to 4 times daily.  Dx Code: E11.319 200 each 12   hydrocortisone cream 0.5 % Apply 1 application. topically 2 (two) times  daily. 30 g 0   insulin degludec (TRESIBA FLEXTOUCH) 100 UNIT/ML FlexTouch Pen INJECT 10 UNITS SUBCUTANEOUSLY ONCE DAILY 15 mL 3   Insulin Pen Needle (RELION PEN NEEDLES) 31G X 6 MM MISC USE AS DIRECTED TO  CHECK  BLOOD  SUGAR 100 each 3   iron polysaccharides (NIFEREX) 150 MG capsule Take 1 capsule (150 mg total) by mouth daily. 30 capsule 5   Lancet Devices (ACCU-CHEK SOFTCLIX) lancets Use as instructed up to 4 times daily.  Dx E11.319 400 each 3   Lancets Misc. (ACCU-CHEK FASTCLIX LANCET) KIT  Use to test sugars up to 4 times daily.  Dx Code: E11.319 1 kit 0   LORazepam (ATIVAN) 0.5 MG tablet Take 1 tablet (0.5 mg total) by mouth at bedtime as needed for anxiety. (Patient not taking: Reported on 08/10/2021) 20 tablet 0   metoprolol succinate (TOPROL XL) 25 MG 24 hr tablet Take 1 tablet (25 mg total) by mouth daily. 30 tablet 2   nitroGLYCERIN (NITROSTAT) 0.4 MG SL tablet Place 1 tablet (0.4 mg total) under the tongue every 5 (five) minutes as needed for chest pain. 30 tablet 12   pantoprazole (PROTONIX) 40 MG tablet Take 1 tablet (40 mg total) by mouth daily before breakfast. (Patient not taking: Reported on 11/16/2021) 30 tablet 0   rosuvastatin (CRESTOR) 10 MG tablet Take 1 tablet (10 mg total) by mouth daily. 90 tablet 0   vitamin B-12 (CYANOCOBALAMIN) 500 MCG tablet Take 500 mcg by mouth daily.     No current facility-administered medications for this visit.    REVIEW OF SYSTEMS:   10 Point review of Systems was done is negative except as noted above.   PHYSICAL EXAMINATION: ECOG FS:1 - Symptomatic but completely ambulatory .BP 124/67   Pulse 73   Temp (!) 97.3 F (36.3 C)   Resp 20   Wt 127 lb 3.2 oz (57.7 kg)   SpO2 100%   BMI 19.34 kg/m  NAD GENERAL:alert, in no acute distress and comfortable SKIN: no acute rashes, no significant lesions EYES: conjunctiva are pink and non-injected, sclera anicteric OROPHARYNX: MMM, no exudates, no oropharyngeal erythema or ulceration NECK:  supple, no JVD LYMPH:  no palpable lymphadenopathy in the axillary or inguinal regions.  Few borderline palpable cervical lymph nodes bilaterally is completely calculi with granulomatous smoker about shortly acid reflux score LUNGS: clear to auscultation b/l with normal respiratory effort HEART: regular rate & rhythm ABDOMEN:  normoactive bowel sounds , non tender, not distended. Extremity: no pedal edema PSYCH: alert & oriented x 3 with fluent speech NEURO: no focal motor/sensory deficits   LABORATORY DATA:  I have reviewed the data as listed  .    Latest Ref Rng & Units 05/17/2022    8:15 AM 01/07/2022    8:56 AM 09/24/2021    8:54 AM  CBC  WBC 4.0 - 10.5 K/uL 11.1  11.2  10.8   Hemoglobin 13.0 - 17.0 g/dL 12.0  12.4  11.2   Hematocrit 39.0 - 52.0 % 37.5  39.1  36.7   Platelets 150 - 400 K/uL 236  256  254    .CBC    Component Value Date/Time   WBC 11.1 (H) 05/17/2022 0815   RBC 4.60 05/17/2022 0815   HGB 12.0 (L) 05/17/2022 0815   HGB 12.4 (L) 01/07/2022 0856   HGB 12.3 (L) 03/22/2018 0941   HCT 37.5 (L) 05/17/2022 0815   HCT 38.9 03/22/2018 0941   PLT 236 05/17/2022 0815   PLT 256 01/07/2022 0856   PLT 235 03/22/2018 0941   MCV 81.5 05/17/2022 0815   MCV 85 03/22/2018 0941   MCH 26.1 05/17/2022 0815   MCHC 32.0 05/17/2022 0815   RDW 14.9 05/17/2022 0815   RDW 13.5 03/22/2018 0941   LYMPHSABS 4.0 05/17/2022 0815   LYMPHSABS 6.3 (H) 03/22/2018 0941   MONOABS 1.0 05/17/2022 0815   EOSABS 0.4 05/17/2022 0815   EOSABS 0.2 03/22/2018 0941   BASOSABS 0.1 05/17/2022 0815   BASOSABS 0.0 03/22/2018 0941     .    Latest Ref  Rng & Units 05/17/2022    8:15 AM 01/07/2022    8:56 AM 12/03/2021   10:20 AM  CMP  Glucose 70 - 99 mg/dL 157  148  111   BUN 8 - 23 mg/dL _0 Creatinine 0.61 - 1.24 mg/dL 1.00  0.88  0.72   Sodium 135 - 145 mmol/L 137  137  139   Potassium 3.5 - 5.1 mmol/L 4.6  4.6  4.5   Chloride 98 - 111 mmol/L 104  102  102   CO2 22 - 32 mmol/L _1 Calcium 8.9 - 10.3 mg/dL 9.3  10.0  9.2   Total Protein 6.5 - 8.1 g/dL 7.4  7.9    Total Bilirubin 0.3 - 1.2 mg/dL 0.3  0.3    Alkaline Phos 38 - 126 U/L 81  74    AST 15 - 41 U/L 13  14    ALT 0 - 44 U/L 9  10     . Lab Results  Component Value Date   LDH 120 05/17/2022    06/18/18 Right Cervical LN Needle/core Biopsy:    05/23/18 Fine Needle Aspiration Flow Cytometry:    RADIOGRAPHIC STUDIES: I have personally reviewed the radiological images as listed and agreed with the findings in the report. No results found.  ASSESSMENT & PLAN:   71 y.o. male with  1. 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 Benign behavior is not established. Bulky adenopathy such as this could relate to lymphoma, or metastatic squamous cell carcinoma. There is no visible extranodal spread of tumor.  -06/18/18 biopsy favored a low grade Marginal Zone Lymphoma -06/15/18 PET/CT revealed Enlarged and hypermetabolic lymph nodes involving the neck, axilla, subpectoral and mediastinal lymph nodes. No lymphadenopathy below the diaphragm. No findings for osseous lymphoma -06/07/18 ECHO for treatment planning revealed a normal ejection fraction -02/02/21 PET/CT showed unchanged mediastinal lymphadenopathy with similar hypermetabolic activity. Interval development of moderate right and small left pleural effusion R > L.  -06/07/2021 Underwent thoracentesis with removal of 200 ml of straw colored fluid. Cytology revealed predominantly lymphocytic (95%) with 8-9% B-cells present. This is suggestive of involvement of pleural fluid by lymphoma.    PLAN: Patient's labs from today were discussed with him in detail CBC shows normal hemoglobin of 12 with  WBC count of 11.1k platelets of 236k CMP stable  LDH within normal limits at 120. Patient has no overt lab or clinical evidence of lymphoma progression at this time. No indication to initiate additional treatment for the patient's nodal marginal zone lymphoma at this time.  #2 generalized anxiety related to family stressors with the loss of his son about a year ago and significant trouble medical interventions including CABG and lymphoma treatment. -Stable  #3  Diabetes type 2 Continue follow-up with primary care physician for continued management of his diabetes Follow-up Return to clinic with Dr. Irene Limbo with labs in 5 months    The total time spent in the appointment was 20 minutes*.  All of the patient's questions were answered with apparent satisfaction. The patient knows to call the clinic with any problems, questions or  concerns.   Sullivan Lone MD MS AAHIVMS [facility] Hematology/Oncology Physician [facility]  .*Total Encounter Time as defined by the Centers for Medicare and Medicaid Services includes, in addition to the face-to-face time of a patient visit (documented in the note above) non-face-to-face time: obtaining and reviewing outside history, ordering and reviewing medications, tests or procedures, care coordination (communications with other health care professionals or caregivers) and documentation in the medical record.   Sullivan Lone MD MS Hematology/Oncology Physician [facility]  .

## 2022-06-05 ENCOUNTER — Other Ambulatory Visit: Payer: Self-pay | Admitting: Family Medicine

## 2022-06-05 DIAGNOSIS — E11311 Type 2 diabetes mellitus with unspecified diabetic retinopathy with macular edema: Secondary | ICD-10-CM

## 2022-06-05 DIAGNOSIS — Z794 Long term (current) use of insulin: Secondary | ICD-10-CM

## 2022-06-05 DIAGNOSIS — I2510 Atherosclerotic heart disease of native coronary artery without angina pectoris: Secondary | ICD-10-CM

## 2022-07-19 ENCOUNTER — Ambulatory Visit (INDEPENDENT_AMBULATORY_CARE_PROVIDER_SITE_OTHER): Payer: 59 | Admitting: Student

## 2022-07-19 ENCOUNTER — Encounter: Payer: Self-pay | Admitting: Student

## 2022-07-19 VITALS — BP 118/56 | HR 71 | Ht 68.0 in | Wt 127.8 lb

## 2022-07-19 DIAGNOSIS — C8590 Non-Hodgkin lymphoma, unspecified, unspecified site: Secondary | ICD-10-CM

## 2022-07-19 DIAGNOSIS — E11311 Type 2 diabetes mellitus with unspecified diabetic retinopathy with macular edema: Secondary | ICD-10-CM

## 2022-07-19 DIAGNOSIS — I10 Essential (primary) hypertension: Secondary | ICD-10-CM | POA: Diagnosis not present

## 2022-07-19 DIAGNOSIS — Z794 Long term (current) use of insulin: Secondary | ICD-10-CM | POA: Diagnosis not present

## 2022-07-19 DIAGNOSIS — E1169 Type 2 diabetes mellitus with other specified complication: Secondary | ICD-10-CM

## 2022-07-19 DIAGNOSIS — E785 Hyperlipidemia, unspecified: Secondary | ICD-10-CM

## 2022-07-19 DIAGNOSIS — Z5982 Transportation insecurity: Secondary | ICD-10-CM

## 2022-07-19 LAB — POCT GLYCOSYLATED HEMOGLOBIN (HGB A1C): HbA1c, POC (controlled diabetic range): 7.8 % — AB (ref 0.0–7.0)

## 2022-07-19 MED ORDER — TRESIBA FLEXTOUCH 100 UNIT/ML ~~LOC~~ SOPN: PEN_INJECTOR | SUBCUTANEOUS | 3 refills | Status: DC

## 2022-07-19 MED ORDER — TRULICITY 0.75 MG/0.5ML ~~LOC~~ SOAJ: 0.7500 mg | SUBCUTANEOUS | 3 refills | Status: DC

## 2022-07-19 NOTE — Assessment & Plan Note (Signed)
Follows w/ cancer center Will try PT again Placed referral to chronic care management for assistance w/ transportation to PT/office visits

## 2022-07-19 NOTE — Assessment & Plan Note (Signed)
A1c 7.%. At goal No changes to medications today, refilled most

## 2022-07-19 NOTE — Assessment & Plan Note (Signed)
BP initially elevated, repeat 118/56. Continue Toprol 25 mg daily

## 2022-07-19 NOTE — Patient Instructions (Addendum)
It was great seeing you today. Continue taking all of your medications as prescribed. We are not making any changes today to your medicines. I refilled your [place] and Trulicity.  We will plan to see you again in 3-6 months or sooner if needed.   If you have any questions or concerns, please feel free to call the clinic.    Be well,  Dr. Orvis Brill [facility] [phone]

## 2022-07-19 NOTE — Progress Notes (Signed)
    SUBJECTIVE:   CHIEF COMPLAINT / HPI:   T2DM -Current regimen: Metformin '1000mg'$  BID, Trulicity 5.32 once weekly, Tresiba 10 u once daily, Jardiance 25 mg daily -A1c: 7.8% today -Sx's of low or high blood glucose: no -Requesting refill on medications  HTN -BP at home in 120s/60s -Toprol 25 mg daily -No chest pain, dizziness.  Stage IV nodal marginal zone lymphoma. -Follows w/ cancer center -Not currently under chemotherapy. Mr. Mccready says his oncologist reports a 3-4 year prognosis -Overall feels decently well but does have to walk w/ a cane, he is feeling some weakness -Did PT a bit, but has some transportation issues  PERTINENT  PMH / PSH: Reviewed  OBJECTIVE:   BP (!) 118/56   Pulse 71   Ht '5\' 8"'$  (1.727 m)   Wt 127 lb 12.8 oz (58 kg)   SpO2 100%   BMI 19.43 kg/m   General: Well-appearing, thin, sitting in chair in no distress CV: RRR, well-healed midline sternotomy scar Resp: Normal work of breathing on room air, no wheezes/crackles Neuro: A&O x4, speech clear and fluent Ext: No edema   ASSESSMENT/PLAN:   Non-Hodgkin's lymphoma ([place]) Follows w/ cancer center Will try PT again Placed referral to chronic care management for assistance w/ transportation to PT/office visits  Type 2 diabetes mellitus with diabetic retinopathy ([place]) A1c 7.%. At goal No changes to medications today, refilled most  Essential hypertension BP initially elevated, repeat 118/56. Continue Toprol 25 mg daily    Orvis Brill, DO [place]

## 2022-07-21 ENCOUNTER — Telehealth: Payer: Self-pay

## 2022-07-21 NOTE — Telephone Encounter (Addendum)
   Telephone encounter was:  Successful.  07/21/2022 Name: Alec York MRN: [id number] DOB: 16-Jul-1951  Alec York is a 71 y.o. year old male who is a primary care patient of Orvis Brill, DO . The community resource team was consulted for assistance with Transportation Needs   Care guide performed the following interventions: Patient provided with information about care guide support team and interviewed to confirm resource needs. Patient uses [employer] and [employer], he just has issues with booking rides due to language barrier. CG contacted [employer] and [employer] [place] to advise patient how to reach the interpreter services for each vendor. Patient understood. CG has sent the instructions by mail as well.  Follow Up Plan:  Care guide will follow up with patient by phone over the next few days  [place], Abie [place]  Main Phone: [phone]  E-mail: [email or web address]  Website: [email or web address]

## 2022-08-08 ENCOUNTER — Other Ambulatory Visit: Payer: Self-pay | Admitting: Student

## 2022-08-08 ENCOUNTER — Other Ambulatory Visit: Payer: Self-pay

## 2022-08-08 DIAGNOSIS — I2510 Atherosclerotic heart disease of native coronary artery without angina pectoris: Secondary | ICD-10-CM

## 2022-08-08 DIAGNOSIS — E11311 Type 2 diabetes mellitus with unspecified diabetic retinopathy with macular edema: Secondary | ICD-10-CM

## 2022-08-08 MED ORDER — METOPROLOL SUCCINATE ER 25 MG PO TB24: 25.0000 mg | ORAL_TABLET | Freq: Every day | ORAL | 0 refills | Status: DC

## 2022-08-23 ENCOUNTER — Other Ambulatory Visit: Payer: Self-pay

## 2022-08-23 MED ORDER — ACCU-CHEK GUIDE VI STRP: ORAL_STRIP | 12 refills | Status: DC

## 2022-08-31 ENCOUNTER — Ambulatory Visit: Payer: 59 | Admitting: Student

## 2022-09-22 ENCOUNTER — Other Ambulatory Visit: Payer: Self-pay

## 2022-09-22 DIAGNOSIS — C8590 Non-Hodgkin lymphoma, unspecified, unspecified site: Secondary | ICD-10-CM

## 2022-09-23 ENCOUNTER — Other Ambulatory Visit: Payer: Self-pay

## 2022-09-23 ENCOUNTER — Inpatient Hospital Stay: Payer: 59

## 2022-09-23 ENCOUNTER — Inpatient Hospital Stay: Payer: 59 | Attending: Hematology | Admitting: Hematology

## 2022-09-23 VITALS — BP 113/59 | HR 70 | Temp 97.9°F | Resp 16 | Wt 129.8 lb

## 2022-09-23 DIAGNOSIS — C8591 Non-Hodgkin lymphoma, unspecified, lymph nodes of head, face, and neck: Secondary | ICD-10-CM | POA: Insufficient documentation

## 2022-09-23 DIAGNOSIS — M6284 Sarcopenia: Secondary | ICD-10-CM | POA: Diagnosis not present

## 2022-09-23 DIAGNOSIS — R5383 Other fatigue: Secondary | ICD-10-CM

## 2022-09-23 DIAGNOSIS — C8590 Non-Hodgkin lymphoma, unspecified, unspecified site: Secondary | ICD-10-CM

## 2022-09-23 DIAGNOSIS — Z87891 Personal history of nicotine dependence: Secondary | ICD-10-CM | POA: Insufficient documentation

## 2022-09-23 DIAGNOSIS — Z79899 Other long term (current) drug therapy: Secondary | ICD-10-CM | POA: Insufficient documentation

## 2022-09-23 DIAGNOSIS — F411 Generalized anxiety disorder: Secondary | ICD-10-CM | POA: Diagnosis not present

## 2022-09-23 DIAGNOSIS — E119 Type 2 diabetes mellitus without complications: Secondary | ICD-10-CM | POA: Insufficient documentation

## 2022-09-23 LAB — CMP (CANCER CENTER ONLY)
ALT: 13 U/L (ref 0–44)
AST: 18 U/L (ref 15–41)
Albumin: 4.2 g/dL (ref 3.5–5.0)
Alkaline Phosphatase: 80 U/L (ref 38–126)
Anion gap: 5 (ref 5–15)
BUN: 16 mg/dL (ref 8–23)
CO2: 31 mmol/L (ref 22–32)
Calcium: 9.7 mg/dL (ref 8.9–10.3)
Chloride: 101 mmol/L (ref 98–111)
Creatinine: 0.95 mg/dL (ref 0.61–1.24)
GFR, Estimated: >60 mL/min (ref >60)
Glucose, Bld: 201 mg/dL — ABNORMAL HIGH (ref 70–99)
Potassium: 4.6 mmol/L (ref 3.5–5.1)
Sodium: 137 mmol/L (ref 135–145)
Total Bilirubin: 0.4 mg/dL (ref 0.3–1.2)
Total Protein: 8 g/dL (ref 6.5–8.1)

## 2022-09-23 LAB — CBC WITH DIFFERENTIAL (CANCER CENTER ONLY)
Abs Immature Granulocytes: 0.05 K/uL (ref 0.00–0.07)
Basophils Absolute: 0.1 K/uL (ref 0.0–0.1)
Basophils Relative: 1 %
Eosinophils Absolute: 0.3 K/uL (ref 0.0–0.5)
Eosinophils Relative: 2 %
HCT: 40.3 % (ref 39.0–52.0)
Hemoglobin: 13 g/dL (ref 13.0–17.0)
Immature Granulocytes: 0 %
Lymphocytes Relative: 31 %
Lymphs Abs: 4.2 K/uL — ABNORMAL HIGH (ref 0.7–4.0)
MCH: 26.5 pg (ref 26.0–34.0)
MCHC: 32.3 g/dL (ref 30.0–36.0)
MCV: 82.1 fL (ref 80.0–100.0)
Monocytes Absolute: 1.2 K/uL — ABNORMAL HIGH (ref 0.1–1.0)
Monocytes Relative: 9 %
Neutro Abs: 7.5 K/uL (ref 1.7–7.7)
Neutrophils Relative %: 57 %
Platelet Count: 244 K/uL (ref 150–400)
RBC: 4.91 MIL/uL (ref 4.22–5.81)
RDW: 15.2 % (ref 11.5–15.5)
WBC Count: 13.4 K/uL — ABNORMAL HIGH (ref 4.0–10.5)
nRBC: 0 % (ref 0.0–0.2)

## 2022-09-23 LAB — VITAMIN B12: Vitamin B-12: 451 pg/mL (ref 180–914)

## 2022-09-23 LAB — LACTATE DEHYDROGENASE: LDH: 143 U/L (ref 98–192)

## 2022-09-23 LAB — FERRITIN: Ferritin: 43 ng/mL (ref 24–336)

## 2022-09-30 NOTE — Progress Notes (Signed)
HEMATOLOGY/ONCOLOGY CLNIC NOTE  Date of Service: 09/23/2022   Patient Care Team: Orvis Brill, DO as PCP - General (Family Medicine) O'Neal, Cassie Freer, MD as Consulting Physician (Cardiology) Brunetta Genera, MD as Consulting Physician (Hematology) Margaretha Seeds, MD as Consulting Physician (Pulmonary Disease)  Melissa Montane, MD as ENT  CHIEF COMPLAINTS/PURPOSE OF CONSULTATION:  Follow-up for continued evaluation and management of low-grade non-Hodgkin's lymphoma  CURRENT TREATMENT: Watchful observation  PREVIOUS TREATMENT Rituxan weekly x 4 doses  INTERVAL HISTORY:   [place] here for continued evaluation and management of his notable marginal zone lymphoma. He notes no acute new symptoms since his last clinic visit. No fevers no chills no night sweats no unexpected weight loss.  No new lumps or bumps.  No new abdominal pain or distention.  No new chest pain or shortness of breath..  Labs done today were discussed in detail with the patient. He notes his diabetes has been well controlled.  MEDICAL HISTORY:  Past Medical History:  Diagnosis Date   Allergy    Coronary artery disease    Diabetes mellitus without complication ([place])    Diabetic retinopathy ([place])    Disabled 2/2 retinopathy   GERD (gastroesophageal reflux disease)    Hyperlipidemia    Hypertension     SURGICAL HISTORY: Past Surgical History:  Procedure Laterality Date   CARDIAC CATHETERIZATION     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   CABG x 4 [place]    SOCIAL HISTORY: Social History   Socioeconomic History   Marital status: Married    Spouse name: [name]   Number of children: 2   Years of education: 16   Highest education level: Bachelor's degree (e.g., BA, AB, BS)  Occupational History   Occupation: Retired  Tobacco Use   Smoking status: Former    Packs/day: 0.50    Years: 10.00    Total pack years: 5.00    Types: Cigarettes    Start date: 52     Quit date: 2010    Years since quitting: 13.8    Passive exposure: Past   Smokeless tobacco: Never  Vaping Use   Vaping Use: Never used  Substance and Sexual Activity   Alcohol use: No    Alcohol/week: 0.0 standard drinks of alcohol   Drug use: No   Sexual activity: Yes    Birth control/protection: None  Other Topics Concern   Not on file  Social History Narrative   Patient lives with his wife Mikey [employer] [facility] patient.)    Patient has 2 children, one [place] and one [place].    Patient uses [employer] transportation and Medicare transportation.    College in [place].   Social Determinants of Health   Financial Resource Strain: Low Risk  (01/12/2022)   Overall Financial Resource Strain (CARDIA)    Difficulty of Paying Living Expenses: Not hard at all  Food Insecurity: No Food Insecurity (01/12/2022)   Hunger Vital Sign    Worried About Running Out of Food in the Last Year: Never true    Ran Out of Food in the Last Year: Never true  Transportation Needs: No Transportation Needs (01/12/2022)   PRAPARE - Hydrologist (Medical): No    Lack of Transportation (Non-Medical): No  Physical Activity: Insufficiently Active (01/12/2022)   Exercise Vital Sign    Days of Exercise per Week: 7 days    Minutes of Exercise per Session: 10 min  Stress: No Stress Concern  Present (01/12/2022)   [place]    Feeling of Stress : Not at all  Social Connections: Moderately Isolated (01/12/2022)   Social Connection and Isolation Panel [NHANES]    Frequency of Communication with Friends and Family: Twice a week    Frequency of Social Gatherings with Friends and Family: More than three times a week    Attends Religious Services: Never    Marine scientist or Organizations: No    Attends Archivist Meetings: Never    Marital Status: Married  Human resources officer Violence: Not At Risk (01/12/2022)   Humiliation,  Afraid, Rape, and Kick questionnaire    Fear of Current or Ex-Partner: No    Emotionally Abused: No    Physically Abused: No    Sexually Abused: No    FAMILY HISTORY: Family History  Problem Relation Age of Onset   Heart disease Mother    Heart disease Father    Heart disease Brother    Hypertension Brother    Diabetes Brother     ALLERGIES:  has No Known Allergies.  MEDICATIONS:  Current Outpatient Medications  Medication Sig Dispense Refill   Accu-Chek FastClix Lancets MISC Use to test sugars up to 4 times daily.  Dx Code: E11.319 102 each 12   Accu-Chek Softclix Lancets lancets Use as instructed 100 each 12   aspirin EC 81 MG tablet Take 1 tablet (81 mg total) by mouth daily. Swallow whole. 90 tablet 3   b complex vitamins capsule Take 1 capsule by mouth daily.     blood glucose meter kit and supplies KIT Dispense based on patient and insurance preference. Use up to four times daily as directed. Dx E11.319 1 each 0   Blood Glucose Monitoring Suppl (ACCU-CHEK GUIDE) w/Device KIT 1 each by Does not apply route 4 (four) times daily as needed. Use to test sugars up to 4 times daily.  Dx Code: E11.319 1 kit 0   Blood Pressure Monitoring (BLOOD PRESSURE CUFF) MISC 1 Units by Does not apply route daily. 1 each 0   cetirizine (ZYRTEC ALLERGY) 10 MG tablet Take 1 tablet (10 mg total) by mouth daily. 30 tablet 0   cholecalciferol (VITAMIN D3) 25 MCG (1000 UNIT) tablet Take 2,000 Units by mouth daily.     diclofenac Sodium (VOLTAREN) 1 % GEL Apply 2 g topically 4 (four) times daily. (Patient not taking: Reported on 01/12/2022) 150 g 3   Dulaglutide (TRULICITY) 0.09 FG/1.8EX SOPN Inject 0.75 mg into the skin once a week. 4 mL 3   glucose blood (ACCU-CHEK GUIDE) test strip Use to test sugars up to 4 times daily.  Dx Code: E11.319 200 each 12   hydrocortisone cream 0.5 % Apply 1 application. topically 2 (two) times daily. 30 g 0   insulin degludec (TRESIBA FLEXTOUCH) 100 UNIT/ML FlexTouch Pen  INJECT 10 UNITS SUBCUTANEOUSLY ONCE DAILY 15 mL 3   Insulin Pen Needle (RELION PEN NEEDLES) 31G X 6 MM MISC USE AS DIRECTED TO  CHECK  BLOOD  SUGAR 100 each 3   iron polysaccharides (NIFEREX) 150 MG capsule Take 1 capsule (150 mg total) by mouth daily. 30 capsule 5   JARDIANCE 25 MG TABS tablet Take 1 tablet by mouth once daily 90 tablet 0   Lancet Devices (ACCU-CHEK SOFTCLIX) lancets Use as instructed up to 4 times daily.  Dx E11.319 400 each 3   Lancets Misc. (ACCU-CHEK FASTCLIX LANCET) KIT Use to test sugars  up to 4 times daily.  Dx Code: E11.319 1 kit 0   LORazepam (ATIVAN) 0.5 MG tablet Take 1 tablet (0.5 mg total) by mouth at bedtime as needed for anxiety. (Patient not taking: Reported on 08/10/2021) 20 tablet 0   metFORMIN (GLUCOPHAGE) 1000 MG tablet Take 1 tablet by mouth twice daily 180 tablet 0   metoprolol succinate (TOPROL-XL) 25 MG 24 hr tablet Take 1 tablet (25 mg total) by mouth daily. 90 tablet 0   nitroGLYCERIN (NITROSTAT) 0.4 MG SL tablet Place 1 tablet (0.4 mg total) under the tongue every 5 (five) minutes as needed for chest pain. 30 tablet 12   pantoprazole (PROTONIX) 40 MG tablet Take 1 tablet (40 mg total) by mouth daily before breakfast. (Patient not taking: Reported on 11/16/2021) 30 tablet 0   rosuvastatin (CRESTOR) 10 MG tablet Take 1 tablet by mouth once daily 90 tablet 0   vitamin B-12 (CYANOCOBALAMIN) 500 MCG tablet Take 500 mcg by mouth daily.     No current facility-administered medications for this visit.    REVIEW OF SYSTEMS:   10 Point review of Systems was done is negative except as noted above.   PHYSICAL EXAMINATION: ECOG FS:1 - Symptomatic but completely ambulatory .BP (!) 113/59 (BP Location: Left Arm, Patient Position: Sitting) Comment: Nurse notified  Pulse 70   Temp 97.9 F (36.6 C) (Temporal)   Resp 16   Wt 129 lb 12.8 oz (58.9 kg)   SpO2 100%   BMI 19.74 kg/m  NAD GENERAL:alert, in no acute distress and comfortable SKIN: no acute rashes,  no significant lesions EYES: conjunctiva are pink and non-injected, sclera anicteric OROPHARYNX: MMM, no exudates, no oropharyngeal erythema or ulceration NECK: supple, no JVD LYMPH:  no palpable lymphadenopathy in the cervical, axillary or inguinal regions LUNGS: clear to auscultation b/l with normal respiratory effort HEART: regular rate & rhythm ABDOMEN:  normoactive bowel sounds , non tender, not distended. Extremity: no pedal edema PSYCH: alert & oriented x 3 with fluent speech NEURO: no focal motor/sensory deficits   LABORATORY DATA:  I have reviewed the data as listed  .    Latest Ref Rng & Units 09/23/2022    8:30 AM 05/17/2022    8:15 AM 01/07/2022    8:56 AM  CBC  WBC 4.0 - 10.5 K/uL 13.4  11.1  11.2   Hemoglobin 13.0 - 17.0 g/dL 13.0  12.0  12.4   Hematocrit 39.0 - 52.0 % 40.3  37.5  39.1   Platelets 150 - 400 K/uL 244  236  256    .CBC    Component Value Date/Time   WBC 13.4 (H) 09/23/2022 0830   WBC 11.1 (H) 05/17/2022 0815   RBC 4.91 09/23/2022 0830   HGB 13.0 09/23/2022 0830   HGB 12.3 (L) 03/22/2018 0941   HCT 40.3 09/23/2022 0830   HCT 38.9 03/22/2018 0941   PLT 244 09/23/2022 0830   PLT 235 03/22/2018 0941   MCV 82.1 09/23/2022 0830   MCV 85 03/22/2018 0941   MCH 26.5 09/23/2022 0830   MCHC 32.3 09/23/2022 0830   RDW 15.2 09/23/2022 0830   RDW 13.5 03/22/2018 0941   LYMPHSABS 4.2 (H) 09/23/2022 0830   LYMPHSABS 6.3 (H) 03/22/2018 0941   MONOABS 1.2 (H) 09/23/2022 0830   EOSABS 0.3 09/23/2022 0830   EOSABS 0.2 03/22/2018 0941   BASOSABS 0.1 09/23/2022 0830   BASOSABS 0.0 03/22/2018 0941     .    Latest Ref Rng & Units 09/23/2022  8:30 AM 05/17/2022    8:15 AM 01/07/2022    8:56 AM  CMP  Glucose 70 - 99 mg/dL 201  157  148   BUN 8 - 23 mg/dL _0 Creatinine 0.61 - 1.24 mg/dL 0.95  1.00  0.88   Sodium 135 - 145 mmol/L 137  137  137   Potassium 3.5 - 5.1 mmol/L 4.6  4.6  4.6   Chloride 98 - 111 mmol/L 101  104  102   CO2 22 - 32  mmol/L _1 Calcium 8.9 - 10.3 mg/dL 9.7  9.3  10.0   Total Protein 6.5 - 8.1 g/dL 8.0  7.4  7.9   Total Bilirubin 0.3 - 1.2 mg/dL 0.4  0.3  0.3   Alkaline Phos 38 - 126 U/L 80  81  74   AST 15 - 41 U/L _2 ALT 0 - 44 U/L _3 . Lab Results  Component Value Date   LDH 143 09/23/2022    06/18/18 Right Cervical LN Needle/core Biopsy:    05/23/18 Fine Needle Aspiration Flow Cytometry:    RADIOGRAPHIC STUDIES: I have personally reviewed the radiological images as listed and agreed with the findings in the report. No results found.  ASSESSMENT & PLAN:   71 y.o. male with  1. 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 Benign behavior is not established. Bulky adenopathy such as this could relate to lymphoma, or metastatic squamous cell carcinoma. There is no visible extranodal spread of tumor.  -06/18/18 biopsy favored a low grade Marginal Zone Lymphoma -06/15/18 PET/CT revealed Enlarged and hypermetabolic lymph nodes involving the neck, axilla, subpectoral and mediastinal lymph nodes. No lymphadenopathy below the diaphragm. No findings for osseous lymphoma -06/07/18 ECHO for treatment planning revealed a normal ejection fraction -02/02/21 PET/CT showed unchanged mediastinal lymphadenopathy with similar hypermetabolic activity. Interval development of moderate right and small left pleural effusion R > L.  -06/07/2021 Underwent thoracentesis with removal of 200 ml of straw colored fluid. Cytology revealed predominantly lymphocytic (95%) with 8-9% B-cells present. This is suggestive of involvement of pleural fluid by  lymphoma.   6 patient's labs from today were discussed in detail with him  #2 generalized anxiety related to family stressors with the loss of his son about a year ago and significant trouble medical interventions including CABG and lymphoma treatment. -Stable  #3  Diabetes type 2 Continue follow-up with primary care physician for continued management of his diabetes Follow-up  Referral to outpatient rehab RTC with Dr Irene Limbo with labs in 5-6 months     The total time spent in the appointment was 20 minutes*.  All of the patient's questions were answered with apparent satisfaction. The patient knows to call the clinic with any problems, questions or concerns.   Sullivan Lone MD MS AAHIVMS [facility] Hematology/Oncology Physician [facility]  .*Total Encounter Time as defined by the Centers for Medicare and Medicaid Services includes, in addition to the face-to-face time of a patient visit (documented in the note above) non-face-to-face time: obtaining and reviewing  outside history, ordering and reviewing medications, tests or procedures, care coordination (communications with other health care professionals or caregivers) and documentation in the medical record.   Sullivan Lone MD MS Hematology/Oncology Physician [facility]  .

## 2022-10-12 ENCOUNTER — Other Ambulatory Visit: Payer: Self-pay | Admitting: Student

## 2022-10-12 DIAGNOSIS — I2510 Atherosclerotic heart disease of native coronary artery without angina pectoris: Secondary | ICD-10-CM

## 2022-10-12 DIAGNOSIS — E11311 Type 2 diabetes mellitus with unspecified diabetic retinopathy with macular edema: Secondary | ICD-10-CM

## 2022-10-17 ENCOUNTER — Other Ambulatory Visit: Payer: Self-pay

## 2022-10-17 MED ORDER — CETIRIZINE HCL 10 MG PO TABS: 10.0000 mg | ORAL_TABLET | Freq: Every day | ORAL | 0 refills | Status: DC

## 2022-10-24 ENCOUNTER — Other Ambulatory Visit: Payer: Self-pay

## 2022-10-24 MED ORDER — ACCU-CHEK SOFTCLIX LANCETS MISC: 12 refills | Status: DC

## 2022-10-31 ENCOUNTER — Encounter: Payer: Self-pay | Admitting: Student

## 2022-10-31 ENCOUNTER — Ambulatory Visit (INDEPENDENT_AMBULATORY_CARE_PROVIDER_SITE_OTHER): Payer: 59 | Admitting: Student

## 2022-10-31 VITALS — BP 104/58 | HR 78 | Ht 68.0 in | Wt 128.4 lb

## 2022-10-31 DIAGNOSIS — E11311 Type 2 diabetes mellitus with unspecified diabetic retinopathy with macular edema: Secondary | ICD-10-CM

## 2022-10-31 DIAGNOSIS — I10 Essential (primary) hypertension: Secondary | ICD-10-CM

## 2022-10-31 DIAGNOSIS — R531 Weakness: Secondary | ICD-10-CM

## 2022-10-31 DIAGNOSIS — Z794 Long term (current) use of insulin: Secondary | ICD-10-CM | POA: Diagnosis not present

## 2022-10-31 DIAGNOSIS — R269 Unspecified abnormalities of gait and mobility: Secondary | ICD-10-CM

## 2022-10-31 LAB — POCT GLYCOSYLATED HEMOGLOBIN (HGB A1C): HbA1c, POC (controlled diabetic range): 7.8 % — AB (ref 0.0–7.0)

## 2022-10-31 MED ORDER — TRESIBA FLEXTOUCH 100 UNIT/ML ~~LOC~~ SOPN: PEN_INJECTOR | SUBCUTANEOUS | 3 refills | Status: DC

## 2022-10-31 MED ORDER — CETIRIZINE HCL 10 MG PO TABS: 10.0000 mg | ORAL_TABLET | Freq: Every day | ORAL | 0 refills | Status: DC

## 2022-10-31 NOTE — Progress Notes (Signed)
    SUBJECTIVE:   CHIEF COMPLAINT / HPI:   Mr. Longman is a very pleasant 71 year old male with a history of stage IV nodal marginal zone lymphoma here for follow-up of chronic conditions listed below.  T2DM A1c 7.8%, unchanged from A1c 3 months ago. Taking Metformin 1000 mg daily and Trulicity weekly. Recently increased his [place] to 15 units daily. Gets CBGs in 110s-180s at home. No hypoglycemic symptoms. Glucose numbers at home 161  Hypertension Taking Metoprolol 25 mg daily. No chest pain, SOB, etc.  HLD Takes Crestor 10 mg daily. Patient desires lipid panel today.  Feeling dizzy, fatigued lately. As a result of this, he and his wife have moved in with his son who is now helping to care for him. Has interest in physical therapy. Says that standing up and walking has become difficult. Ambulates with a cane.  PERTINENT  PMH / PSH: Reviewed  OBJECTIVE:   BP (!) 104/58   Pulse 78   Ht '5\' 8"'$  (1.727 m)   Wt 128 lb 6.4 oz (58.2 kg)   SpO2 96%   BMI 19.52 kg/m   General: Alert and cooperative and appears to be in no acute distress, chronically ill appearing Cardio: Normal S1 and S2, no S3 or S4. Rhythm is regular. No murmurs or rubs.   Pulm: Clear to auscultation bilaterally, no crackles, wheezing, or diminished breath sounds. Normal respiratory effort Abdomen: Bowel sounds normal. Abdomen soft and non-tender.  Extremities: No peripheral edema. Diminished muscle tone in bilateral lower extremities.Warm/ well perfused.  Strong radial pulses. Neuro: Cranial nerves grossly intact  ASSESSMENT/PLAN:   Essential hypertension Will trial off of Metoprolol as BP is 818/56, and diastolic has remained low for quite some time. Return precautions discussed Hopeful this will help with dizziness/fatigue. 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 Discussed decreasing insulin, but patient is happy with current regimen. No  changes today. Foot exam completed- no ulceration/decreased sensation.  Generalized weakness Chronic, but worsening as of late (past 3 months) causing him to move in with son for extra assistance Obtain b/l complete knee standing x-rays for OA  Can offer steroid injections at next visit, if patient desires PT referral placed to aid in improving strength and endurance as he is at fall risk     Orvis Brill, [place]

## 2022-10-31 NOTE — Assessment & Plan Note (Signed)
A1c 7.8%, at goal. Discussed decreasing insulin, but patient is happy with current regimen. No changes today. Foot exam completed- no ulceration/decreased sensation.

## 2022-10-31 NOTE — Assessment & Plan Note (Signed)
Will trial off of Metoprolol as BP is 695/07, and diastolic has remained low for quite some time. Return precautions discussed Hopeful this will help with dizziness/fatigue. F/u in 1 month to check BP

## 2022-10-31 NOTE — Assessment & Plan Note (Signed)
Chronic, but worsening as of late (past 3 months) causing him to move in with son for extra assistance Obtain b/l complete knee standing x-rays for OA  Can offer steroid injections at next visit, if patient desires PT referral placed to aid in improving strength and endurance as he is at fall risk

## 2022-10-31 NOTE — Patient Instructions (Addendum)
It was great seeing you today.  Please go to [facility] [address] [place], [place], [place] [address] to get your knee X-rays.  I sent in a referral for physical therapy. They will call you to get this scheduled.  STOP taking your Metoprolol.  We will plan to see you back in [place].  We collected testing today- I will call you if it is abnormal. If your results are normal, I will send you a MyChart message.   If you have any questions or concerns, please feel free to call the clinic.   Have a wonderful day,  Dr. Orvis Brill [facility] [phone]

## 2022-11-01 LAB — COMPREHENSIVE METABOLIC PANEL
ALT: 13 IU/L (ref 0–44)
AST: 22 IU/L (ref 0–40)
Albumin/Globulin Ratio: 1.5 (ref 1.2–2.2)
Albumin: 4.3 g/dL (ref 3.9–4.9)
Alkaline Phosphatase: 80 IU/L (ref 44–121)
BUN/Creatinine Ratio: 13 (ref 10–24)
BUN: 12 mg/dL (ref 8–27)
Bilirubin Total: 0.2 mg/dL (ref 0.0–1.2)
CO2: 26 mmol/L (ref 20–29)
Calcium: 9.3 mg/dL (ref 8.6–10.2)
Chloride: 101 mmol/L (ref 96–106)
Creatinine, Ser: 0.96 mg/dL (ref 0.76–1.27)
Globulin, Total: 2.9 g/dL (ref 1.5–4.5)
Glucose: 162 mg/dL — ABNORMAL HIGH (ref 70–99)
Potassium: 4.8 mmol/L (ref 3.5–5.2)
Sodium: 139 mmol/L (ref 134–144)
Total Protein: 7.2 g/dL (ref 6.0–8.5)
eGFR: 86 mL/min/{1.73_m2} (ref >59)

## 2022-11-01 LAB — LIPID PANEL
Chol/HDL Ratio: 2 ratio (ref 0.0–5.0)
Cholesterol, Total: 84 mg/dL — ABNORMAL LOW (ref 100–199)
HDL: 41 mg/dL (ref >39)
LDL Chol Calc (NIH): 29 mg/dL (ref 0–99)
Triglycerides: 58 mg/dL (ref 0–149)
VLDL Cholesterol Cal: 14 mg/dL (ref 5–40)

## 2022-11-03 ENCOUNTER — Telehealth: Payer: Self-pay

## 2022-11-03 NOTE — Telephone Encounter (Signed)
Patient calls nurse line requesting lab results.   Will forward to PCP.

## 2022-11-04 ENCOUNTER — Telehealth: Payer: Self-pay | Admitting: Student

## 2022-11-04 NOTE — Telephone Encounter (Signed)
Called patient with lab results. Answered all questions. No changes to medications.

## 2022-11-04 NOTE — Telephone Encounter (Signed)
Patient returns call to nurse line requesting to speak with Dr. Owens Shark regarding results.   Please return call to patient at [place], RN

## 2022-11-07 ENCOUNTER — Ambulatory Visit (INDEPENDENT_AMBULATORY_CARE_PROVIDER_SITE_OTHER): Payer: 59 | Admitting: Family Medicine

## 2022-11-07 ENCOUNTER — Ambulatory Visit
Admission: RE | Admit: 2022-11-07 | Discharge: 2022-11-07 | Disposition: A | Discharge status: Home / Self Care | Payer: 59 | Source: Ambulatory Visit | Attending: Family Medicine | Admitting: Family Medicine

## 2022-11-07 VITALS — BP 112/64 | Ht 68.0 in | Wt 128.0 lb

## 2022-11-07 DIAGNOSIS — M25561 Pain in right knee: Secondary | ICD-10-CM

## 2022-11-07 DIAGNOSIS — M25562 Pain in left knee: Secondary | ICD-10-CM

## 2022-11-07 DIAGNOSIS — R29898 Other symptoms and signs involving the musculoskeletal system: Secondary | ICD-10-CM

## 2022-11-07 DIAGNOSIS — G8929 Other chronic pain: Secondary | ICD-10-CM

## 2022-11-07 DIAGNOSIS — M79602 Pain in left arm: Secondary | ICD-10-CM

## 2022-11-07 DIAGNOSIS — R269 Unspecified abnormalities of gait and mobility: Secondary | ICD-10-CM

## 2022-11-07 MED ORDER — MELOXICAM 15 MG PO TABS: 15.0000 mg | ORAL_TABLET | Freq: Every day | ORAL | 2 refills | Status: DC

## 2022-11-07 NOTE — Patient Instructions (Signed)
Get x-rays of your knees after you leave today. Take meloxicam '15mg'$  daily with food for pain and inflammation. Ok to take tylenol with this if needed. Most of your issues relate to your loss of strength and muscle mass (falls, imbalance, pain, weakness). Start physical therapy and do home exercises on days you don't go to therapy. Continue use of cane to help get around. Follow up with me in 6 weeks for reevaluation.

## 2022-11-08 ENCOUNTER — Encounter: Payer: Self-pay | Admitting: Family Medicine

## 2022-11-08 NOTE — Progress Notes (Signed)
PCP: Orvis Brill, DO  Subjective:   HPI: Patient is a 71 y.o. male here for bilateral knee pain, left arm, low back pain.  Patient reports he has lost about 28 pounds over the past year related to his treatment for non-hodgkins lymphoma. Over the past several months he has had several aches and pains. Worst of which is in bilateral anterior knees. Worse with walking, standing for prolonged periods. Using a cane to help get around. No acute injury though balance has been an issue, feels he's going to fall. Also with left anterior upper arm pain. No swelling or bruising and no acute injury. Feels more with carrying items and feels weaker. Left sided low back pain without radiation into his legs. No bowel/bladder dysfunction. No numbness/tingling.  Past Medical History:  Diagnosis Date   Allergy    Coronary artery disease    Diabetes mellitus without complication ([place])    Diabetic retinopathy ([place])    Disabled 2/2 retinopathy   GERD (gastroesophageal reflux disease)    Hyperlipidemia    Hypertension     Current Outpatient Medications on File Prior to Visit  Medication Sig Dispense Refill   Accu-Chek FastClix Lancets MISC Use to test sugars up to 4 times daily.  Dx Code: E11.319 102 each 12   Accu-Chek Softclix Lancets lancets Use as instructed 100 each 12   aspirin EC 81 MG tablet Take 1 tablet (81 mg total) by mouth daily. Swallow whole. 90 tablet 3   b complex vitamins capsule Take 1 capsule by mouth daily.     blood glucose meter kit and supplies KIT Dispense based on patient and insurance preference. Use up to four times daily as directed. Dx E11.319 1 each 0   Blood Glucose Monitoring Suppl (ACCU-CHEK GUIDE) w/Device KIT 1 each by Does not apply route 4 (four) times daily as needed. Use to test sugars up to 4 times daily.  Dx Code: E11.319 1 kit 0   Blood Pressure Monitoring (BLOOD PRESSURE CUFF) MISC 1 Units by Does not apply route daily. 1 each 0   cetirizine (ZYRTEC  ALLERGY) 10 MG tablet Take 1 tablet (10 mg total) by mouth daily. 90 tablet 0   cholecalciferol (VITAMIN D3) 25 MCG (1000 UNIT) tablet Take 2,000 Units by mouth daily.     diclofenac Sodium (VOLTAREN) 1 % GEL Apply 2 g topically 4 (four) times daily. (Patient not taking: Reported on 01/12/2022) 150 g 3   glucose blood (ACCU-CHEK GUIDE) test strip Use to test sugars up to 4 times daily.  Dx Code: E11.319 200 each 12   hydrocortisone cream 0.5 % Apply 1 application. topically 2 (two) times daily. 30 g 0   insulin degludec (TRESIBA FLEXTOUCH) 100 UNIT/ML FlexTouch Pen INJECT 15 UNITS SUBCUTANEOUSLY ONCE DAILY 15 mL 3   Insulin Pen Needle (RELION PEN NEEDLES) 31G X 6 MM MISC USE AS DIRECTED TO  CHECK  BLOOD  SUGAR 100 each 3   iron polysaccharides (NIFEREX) 150 MG capsule Take 1 capsule (150 mg total) by mouth daily. 30 capsule 5   JARDIANCE 25 MG TABS tablet Take 1 tablet by mouth once daily 90 tablet 0   Lancet Devices (ACCU-CHEK SOFTCLIX) lancets Use as instructed up to 4 times daily.  Dx E11.319 400 each 3   Lancets Misc. (ACCU-CHEK FASTCLIX LANCET) KIT Use to test sugars up to 4 times daily.  Dx Code: E11.319 1 kit 0   LORazepam (ATIVAN) 0.5 MG tablet Take 1 tablet (0.5 mg total)  by mouth at bedtime as needed for anxiety. (Patient not taking: Reported on 08/10/2021) 20 tablet 0   metFORMIN (GLUCOPHAGE) 1000 MG tablet Take 1 tablet by mouth twice daily 180 tablet 0   metoprolol succinate (TOPROL-XL) 25 MG 24 hr tablet Take 1 tablet by mouth once daily 90 tablet 0   nitroGLYCERIN (NITROSTAT) 0.4 MG SL tablet Place 1 tablet (0.4 mg total) under the tongue every 5 (five) minutes as needed for chest pain. 30 tablet 12   pantoprazole (PROTONIX) 40 MG tablet Take 1 tablet (40 mg total) by mouth daily before breakfast. (Patient not taking: Reported on 11/16/2021) 30 tablet 0   rosuvastatin (CRESTOR) 10 MG tablet Take 1 tablet by mouth once daily 90 tablet 0   TRULICITY 4.03 FV/4.3KG SOPN INJECT 0.75 MG   SUBCUTANEOUSLY ONCE A WEEK 12 mL 0   vitamin B-12 (CYANOCOBALAMIN) 500 MCG tablet Take 500 mcg by mouth daily.     No current facility-administered medications on file prior to visit.    Past Surgical History:  Procedure Laterality Date   CARDIAC CATHETERIZATION     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   CABG x 4 [place]    No Known Allergies  BP 112/64   Ht _0  (1.727 m)   Wt 128 lb (58.1 kg)   BMI 19.46 kg/m       No data to display              No data to display              Objective:  Physical Exam:  Gen: NAD, comfortable in exam room  Bilateral knees: Significant quad atrophy though generalized muscle wasting noted. Mild TTP medial joint line.  No other tenderness. FROM with 5/5 strength flexion and extension. Negative ant/post drawers. Negative valgus/varus testing. Negative lachman.  Negative mcmurrays, apleys.  NV intact distally.  Left arm: No deformity, swelling, bruising. TTP throughout biceps. No other tenderness. 5-/5 strength elbow flexion.  5/5 extension. No pain with yergasons. NVI distally.  Back: No gross deformity, scoliosis. TTP left lumbar paraspinal region.  No midline or bony TTP. FROM. Strength LEs 5/5 all muscle groups.   2+ MSRs in patellar and achilles tendons, equal bilaterally. Negative SLRs. Sensation intact to light touch bilaterally.    Assessment & Plan:  1. Bilateral knee pain - has order in place to get standing radiographs - encouraged to get these.  Consistent with arthritis in setting of sarcopenia.  Start physical therapy and home exercises.  Meloxicam with tylenol as needed.  Cane for stability.  Consider walker if still unstable with this.  2. Left biceps muscle strain - overuse.  Start physical therapy for this as well.  No evidence tear.  3. Low back pain - 2/2 lumbar strain.  Physical therapy, meloxicam, tylenol.

## 2022-12-05 NOTE — Therapy (Signed)
OUTPATIENT PHYSICAL THERAPY LOWER EXTREMITY EVALUATION   Patient Name: Alec York MRN: [id number] DOB:Dec 31, 1950, 72 y.o., male Today's Date: 12/06/2022  END OF SESSION:  PT End of Session - 12/06/22 1227     Visit Number 1    Date for PT Re-Evaluation 02/28/23    Authorization Type [employer]    PT Start Time 1230    PT Stop Time 1315    PT Time Calculation (min) 45 min    Activity Tolerance Patient tolerated treatment well    Behavior During Therapy WFL for tasks assessed/performed             Past Medical History:  Diagnosis Date   Allergy    Coronary artery disease    Diabetes mellitus without complication ([place])    Diabetic retinopathy ([place])    Disabled 2/2 retinopathy   GERD (gastroesophageal reflux disease)    Hyperlipidemia    Hypertension    Past Surgical History:  Procedure Laterality Date   CARDIAC CATHETERIZATION     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   CABG x 4 [place]   Patient Active Problem List   Diagnosis Date Noted   Hypertrophic scar 03/10/2022   Health maintenance examination 12/07/2021   Low back pain 09/06/2021   Gait disturbance 09/06/2021   Generalized weakness 09/06/2021   Hiccup 08/10/2021   Diabetes ([place]) 06/24/2021   Non-Hodgkin's lymphoma ([place]) 06/24/2021   Pleural effusion on right 05/13/2021   Insomnia 03/08/2021   Chronic heart failure with preserved ejection fraction ([place]) 01/28/2021   Coronary artery disease involving native heart without angina pectoris 01/28/2021   CLL (chronic lymphocytic leukemia) ([place]) 03/04/2020   Erectile dysfunction 01/10/2020   Dysphagia 04/16/2019   PAD (peripheral artery disease) ([place]) 07/07/2017   Diabetic peripheral neuropathy associated with type 2 diabetes mellitus ([place]) 07/07/2017   Knee pain, chronic 06/15/2017   Adhesive capsulitis of right shoulder 08/19/2015   Bursitis of right shoulder 06/01/2015   Allergic rhinitis 06/01/2015   Acid reflux 05/13/2015   Type 2 diabetes  mellitus with diabetic retinopathy ([place]) 05/13/2015   Essential hypertension 05/13/2015   Hyperlipidemia associated with type 2 diabetes mellitus ([place]) 05/13/2015    PCP: Orvis Brill  REFERRING PROVIDER: Karlton Lemon  REFERRING DIAG: R29.898, C14.481  THERAPY DIAG:  No diagnosis found.  Rationale for Evaluation and Treatment: Rehabilitation  ONSET DATE: 11/07/22  SUBJECTIVE:   SUBJECTIVE STATEMENT: Not doing good, I have plenty of problems. The biggest is back pain and then on the left arm, I cannot sleep on my left side. And my knees hurt, I cannot walk without a cane or stick.   PERTINENT HISTORY: Patient is a 72 y.o. male here for bilateral knee pain, left arm, low back pain. Patient reports he has lost about 28 pounds over the past year related to his treatment for non-hodgkins lymphoma. Over the past several months he has had several aches and pains. Worst of which is in bilateral anterior knees. Worse with walking, standing for prolonged periods. Using a cane to help get around. No acute injury though balance has been an issue, feels he's going to fall. Also with left anterior upper arm pain. No swelling or bruising and no acute injury. Feels more with carrying items and feels weaker. Left sided low back pain without radiation into his legs. No bowel/bladder dysfunction. No numbness/tingling.  PAIN:  Are you having pain? Yes: NPRS scale: 10/10 Pain location: back, L shoulder, both knees Pain description: achy, constant Aggravating factors: everything  hurts, walking Relieving factors: Tylenol  PRECAUTIONS: None  WEIGHT BEARING RESTRICTIONS: No  FALLS:  Has patient fallen in last 6 months? No  LIVING ENVIRONMENT: Lives with: lives with their family Lives in: House/apartment Stairs: Yes: Internal: 14 steps; on right going up Has following equipment at home: Single point cane and Walker - 2 wheeled  OCCUPATION: Retired  PLOF: Independent and Independent with  basic ADLs  PATIENT GOALS: to get stronger and walk better.    OBJECTIVE:   DIAGNOSTIC FINDINGS: FINDINGS: Bony mineralization is within normal limits. There is no fracture or dislocation on this single view. Joint spaces are maintained. No focal osseous lesion. Peripheral vascular calcifications are present.   IMPRESSION: 1. No acute osseous abnormality. 2. Peripheral vascular disease.   COGNITION: Overall cognitive status: Within functional limits for tasks assessed     SENSATION: WFL   POSTURE: rounded shoulders, forward head, and increased thoracic kyphosis  LUMBAR ROM:   AROM eval  Flexion 50% with pain  Extension 50% w/pain  Right lateral flexion Limited with pain  Left lateral flexion Limited with pain  Right rotation 50% w/pain  Left rotation 50% w/pain   (Blank rows = not tested)   LOWER EXTREMITY ROM: WFL bilaterally  LOWER EXTREMITY MMT:  MMT Right eval Left eval  Hip flexion 3+ 3+  Hip extension    Hip abduction    Hip adduction    Hip internal rotation    Hip external rotation    Knee flexion 4 4  Knee extension 4- 4-  Ankle dorsiflexion    Ankle plantarflexion    Ankle inversion    Ankle eversion     (Blank rows = not tested)  FUNCTIONAL TESTS:  5 times sit to stand: 25.26s Timed up and go (TUG): 24.66s  GAIT: Distance walked: in clinic distances Assistive device utilized: Quad cane small base Level of assistance: Modified independence Comments: slowed gait, decreased foot clearance, ataxic gait, slight shuffling pattern   TODAY'S TREATMENT:                                                                                                                              DATE: 12/06/22- EVAL   PATIENT EDUCATION:  Education details: HEP and POC Person educated: Patient Education method: Explanation Education comprehension: verbalized understanding  HOME EXERCISE PROGRAM: Access Code: [email or web address] URL:  [email or web address] Date: 12/06/2022 Prepared by: Andris Baumann  Exercises - Supine Bridge  - 1 x daily - 7 x weekly - 2 sets - 10 reps - Supine Lower Trunk Rotation  - 1 x daily - 7 x weekly - 2 sets - 10 reps - Sit to Stand with Armchair  - 1 x daily - 7 x weekly - 2 sets - 10 reps - Seated Hip Abduction with Resistance  - 1 x daily - 7 x weekly - 2 sets - 10 reps  ASSESSMENT:  CLINICAL IMPRESSION: Patient is a 72 y.o. male who was  seen today for physical therapy evaluation and treatment for LBP, bilateral leg weakness. He presents with ataxic gait, shuffling pattern, and muscle weakness and pain. He has many different pain areas that limit his overall mobility and locomotion. He has many co morbidities that contribute to his condition and will benefit from PT to work on strength and balance to normalize his gait pattern and address his pain.   OBJECTIVE IMPAIRMENTS: Abnormal gait, decreased balance, decreased coordination, decreased mobility, difficulty walking, decreased ROM, and pain.    PERSONAL FACTORS: Age, Behavior pattern, Social background, Transportation, 3+ comorbidities: DM, active cancer, HTN, chronic pain, and language barrier  are also affecting patient's functional outcome.   REHAB POTENTIAL: Good  CLINICAL DECISION MAKING: Stable/uncomplicated  EVALUATION COMPLEXITY: Moderate  GOALS: Goals reviewed with patient? Yes  SHORT TERM GOALS: Target date: 01/17/23  Patient will be independent with initial HEP.  Goal status: INITIAL  2.  Patient will demonstrate TUG score < 15s to decrease risk for falls Baseline: 24.66s Goal status: INITIAL    LONG TERM GOALS: Target date: 02/28/23  Patient will be independent with advanced/ongoing HEP to improve outcomes and carryover.  Goal status: INITIAL  2.  Patient will report 75% improvement in low back pain to improve QOL.  Baseline: 10/10 Goal status: INITIAL  3.  Patient will demonstrate full pain free  lumbar ROM to perform ADLs.   Goal status: INITIAL  4.  Patient will demonstrate improved functional strength as demonstrated by <15s. Baseline: 25.26s Goal status: INITIAL  5. Patient will be able to walk 520f with LRAD and normalized gait pattern Baseline: using cane and RW  Goal status: INITIAL    PLAN:  PT FREQUENCY: 1-2x/week  PT DURATION: 12 weeks  PLANNED INTERVENTIONS: Therapeutic exercises, Therapeutic activity, Neuromuscular re-education, Balance training, Gait training, Patient/Family education, Self Care, Joint mobilization, Stair training, Cryotherapy, Moist heat, Ionotophoresis '4mg'$ /ml Dexamethasone, and Manual therapy  PLAN FOR NEXT SESSION: light gym activities for low back mobility and strength    MAndris Baumann PT 12/06/2022, 2:01 PM

## 2022-12-06 ENCOUNTER — Ambulatory Visit: Payer: 59 | Attending: Family Medicine

## 2022-12-06 DIAGNOSIS — M79602 Pain in left arm: Secondary | ICD-10-CM | POA: Insufficient documentation

## 2022-12-06 DIAGNOSIS — M6281 Muscle weakness (generalized): Secondary | ICD-10-CM

## 2022-12-06 DIAGNOSIS — R2689 Other abnormalities of gait and mobility: Secondary | ICD-10-CM | POA: Diagnosis not present

## 2022-12-06 DIAGNOSIS — G8929 Other chronic pain: Secondary | ICD-10-CM | POA: Diagnosis present

## 2022-12-06 DIAGNOSIS — R29898 Other symptoms and signs involving the musculoskeletal system: Secondary | ICD-10-CM | POA: Insufficient documentation

## 2022-12-06 DIAGNOSIS — M5459 Other low back pain: Secondary | ICD-10-CM | POA: Insufficient documentation

## 2022-12-06 DIAGNOSIS — M25512 Pain in left shoulder: Secondary | ICD-10-CM | POA: Insufficient documentation

## 2022-12-06 DIAGNOSIS — R2681 Unsteadiness on feet: Secondary | ICD-10-CM | POA: Diagnosis present

## 2022-12-06 DIAGNOSIS — M5440 Lumbago with sciatica, unspecified side: Secondary | ICD-10-CM | POA: Insufficient documentation

## 2022-12-13 ENCOUNTER — Other Ambulatory Visit: Payer: Self-pay

## 2022-12-13 DIAGNOSIS — E11311 Type 2 diabetes mellitus with unspecified diabetic retinopathy with macular edema: Secondary | ICD-10-CM

## 2022-12-13 DIAGNOSIS — I2510 Atherosclerotic heart disease of native coronary artery without angina pectoris: Secondary | ICD-10-CM

## 2022-12-13 MED ORDER — CETIRIZINE HCL 10 MG PO TABS: 10.0000 mg | ORAL_TABLET | Freq: Every day | ORAL | 0 refills | Status: DC

## 2022-12-13 MED ORDER — TRESIBA FLEXTOUCH 100 UNIT/ML ~~LOC~~ SOPN: PEN_INJECTOR | SUBCUTANEOUS | 3 refills | Status: DC

## 2022-12-13 MED ORDER — EMPAGLIFLOZIN 25 MG PO TABS: 25.0000 mg | ORAL_TABLET | Freq: Every day | ORAL | 0 refills | Status: DC

## 2022-12-13 MED ORDER — TRULICITY 0.75 MG/0.5ML ~~LOC~~ SOAJ: SUBCUTANEOUS | 0 refills | Status: DC

## 2022-12-13 MED ORDER — METFORMIN HCL 1000 MG PO TABS: 1000.0000 mg | ORAL_TABLET | Freq: Two times a day (BID) | ORAL | 0 refills | Status: DC

## 2022-12-13 MED ORDER — ROSUVASTATIN CALCIUM 10 MG PO TABS: 10.0000 mg | ORAL_TABLET | Freq: Every day | ORAL | 0 refills | Status: DC

## 2022-12-16 ENCOUNTER — Telehealth: Payer: Self-pay

## 2022-12-16 MED ORDER — CETIRIZINE HCL 10 MG PO TABS: 10.0000 mg | ORAL_TABLET | Freq: Every day | ORAL | 0 refills | Status: DC

## 2022-12-16 MED ORDER — TRESIBA FLEXTOUCH 100 UNIT/ML ~~LOC~~ SOPN: PEN_INJECTOR | SUBCUTANEOUS | 3 refills | Status: DC

## 2022-12-16 NOTE — Telephone Encounter (Signed)
Alec York, will you help with this pt's PA? Fax received from [employer]. [employer] needs a PA. Neither the brand or Generic are covered.   Thanks! Nehemiah Settle

## 2022-12-16 NOTE — Telephone Encounter (Signed)
Patient calls nurse line regarding cetirizine and tresiba.   Per chart review, these were sent to "no print."   Resent electronically. Receipt confirmed at 1147 on 12/16/22.  Talbot Grumbling, RN

## 2022-12-16 NOTE — Addendum Note (Signed)
Addended by: Talbot Grumbling on: 12/16/2022 11:49 AM   Modules accepted: Orders

## 2022-12-19 ENCOUNTER — Encounter: Payer: Self-pay | Admitting: Physical Therapy

## 2022-12-19 ENCOUNTER — Ambulatory Visit: Payer: 59 | Admitting: Physical Therapy

## 2022-12-19 DIAGNOSIS — G8929 Other chronic pain: Secondary | ICD-10-CM

## 2022-12-19 DIAGNOSIS — M6281 Muscle weakness (generalized): Secondary | ICD-10-CM

## 2022-12-19 DIAGNOSIS — R2681 Unsteadiness on feet: Secondary | ICD-10-CM

## 2022-12-19 DIAGNOSIS — M5459 Other low back pain: Secondary | ICD-10-CM

## 2022-12-19 DIAGNOSIS — M5440 Lumbago with sciatica, unspecified side: Secondary | ICD-10-CM

## 2022-12-19 DIAGNOSIS — R2689 Other abnormalities of gait and mobility: Secondary | ICD-10-CM

## 2022-12-19 NOTE — Telephone Encounter (Signed)
I can't find the PA request. I remember thinking this was a weird request. His pharmacy is [facility]. Not [facility]. I will look more tomorrow to see if I can find the PA request. Alec York

## 2022-12-19 NOTE — Therapy (Signed)
OUTPATIENT PHYSICAL THERAPY LOWER EXTREMITY EVALUATION   Patient Name: Alec York MRN: [id number] DOB:1951-07-07, 72 y.o., male Today's Date: 12/19/2022  END OF SESSION:  PT End of Session - 12/19/22 1627     Visit Number 2    Date for PT Re-Evaluation 02/28/23    PT Start Time 1625    PT Stop Time 1710    PT Time Calculation (min) 45 min    Activity Tolerance Patient tolerated treatment well    Behavior During Therapy WFL for tasks assessed/performed             Past Medical History:  Diagnosis Date   Allergy    Coronary artery disease    Diabetes mellitus without complication ([place])    Diabetic retinopathy ([place])    Disabled 2/2 retinopathy   GERD (gastroesophageal reflux disease)    Hyperlipidemia    Hypertension    Past Surgical History:  Procedure Laterality Date   CARDIAC CATHETERIZATION     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   CABG x 4 [place]   Patient Active Problem List   Diagnosis Date Noted   Hypertrophic scar 03/10/2022   Health maintenance examination 12/07/2021   Low back pain 09/06/2021   Gait disturbance 09/06/2021   Generalized weakness 09/06/2021   Hiccup 08/10/2021   Diabetes ([place]) 06/24/2021   Non-Hodgkin's lymphoma ([place]) 06/24/2021   Pleural effusion on right 05/13/2021   Insomnia 03/08/2021   Chronic heart failure with preserved ejection fraction ([place]) 01/28/2021   Coronary artery disease involving native heart without angina pectoris 01/28/2021   CLL (chronic lymphocytic leukemia) ([place]) 03/04/2020   Erectile dysfunction 01/10/2020   Dysphagia 04/16/2019   PAD (peripheral artery disease) ([place]) 07/07/2017   Diabetic peripheral neuropathy associated with type 2 diabetes mellitus ([place]) 07/07/2017   Knee pain, chronic 06/15/2017   Adhesive capsulitis of right shoulder 08/19/2015   Bursitis of right shoulder 06/01/2015   Allergic rhinitis 06/01/2015   Acid reflux 05/13/2015   Type 2 diabetes mellitus with diabetic retinopathy  ([place]) 05/13/2015   Essential hypertension 05/13/2015   Hyperlipidemia associated with type 2 diabetes mellitus ([place]) 05/13/2015    PCP: Orvis Brill  REFERRING PROVIDER: Karlton Lemon  REFERRING DIAG: R29.898, M79.602  THERAPY DIAG:  Other abnormalities of gait and mobility  Muscle weakness (generalized)  Unsteadiness on feet  Bilateral low back pain with sciatica, sciatica laterality unspecified, unspecified chronicity  Chronic left shoulder pain  Other low back pain  Rationale for Evaluation and Treatment: Rehabilitation  ONSET DATE: 11/07/22  SUBJECTIVE:   SUBJECTIVE STATEMENT: Patient reports L shoulder pain, back pain, knee pain continue.  PERTINENT HISTORY: Patient is a 72 y.o. male here for bilateral knee pain, left arm, low back pain. Patient reports he has lost about 28 pounds over the past year related to his treatment for non-hodgkins lymphoma. Over the past several months he has had several aches and pains. Worst of which is in bilateral anterior knees. Worse with walking, standing for prolonged periods. Using a cane to help get around. No acute injury though balance has been an issue, feels he's going to fall. Also with left anterior upper arm pain. No swelling or bruising and no acute injury. Feels more with carrying items and feels weaker. Left sided low back pain without radiation into his legs. No bowel/bladder dysfunction. No numbness/tingling.  PAIN:  Are you having pain? Yes: NPRS scale: 10/10 Pain location: back, L shoulder, both knees Pain description: achy, constant Aggravating factors: everything hurts, walking  Relieving factors: Tylenol  PRECAUTIONS: None  WEIGHT BEARING RESTRICTIONS: No  FALLS:  Has patient fallen in last 6 months? No  LIVING ENVIRONMENT: Lives with: lives with their family Lives in: House/apartment Stairs: Yes: Internal: 14 steps; on right going up Has following equipment at home: Single point cane and Walker - 2  wheeled  OCCUPATION: Retired  PLOF: Independent and Independent with basic ADLs  PATIENT GOALS: to get stronger and walk better.    OBJECTIVE:   DIAGNOSTIC FINDINGS: FINDINGS: Bony mineralization is within normal limits. There is no fracture or dislocation on this single view. Joint spaces are maintained. No focal osseous lesion. Peripheral vascular calcifications are present.   IMPRESSION: 1. No acute osseous abnormality. 2. Peripheral vascular disease.   COGNITION: Overall cognitive status: Within functional limits for tasks assessed     SENSATION: WFL   POSTURE: rounded shoulders, forward head, and increased thoracic kyphosis  LUMBAR ROM:   AROM eval  Flexion 50% with pain  Extension 50% w/pain  Right lateral flexion Limited with pain  Left lateral flexion Limited with pain  Right rotation 50% w/pain  Left rotation 50% w/pain   (Blank rows = not tested)   LOWER EXTREMITY ROM: WFL bilaterally  LOWER EXTREMITY MMT:  MMT Right eval Left eval  Hip flexion 3+ 3+  Hip extension    Hip abduction    Hip adduction    Hip internal rotation    Hip external rotation    Knee flexion 4 4  Knee extension 4- 4-  Ankle dorsiflexion    Ankle plantarflexion    Ankle inversion    Ankle eversion     (Blank rows = not tested)  FUNCTIONAL TESTS:  5 times sit to stand: 25.26s Timed up and go (TUG): 24.66s  GAIT: Distance walked: in clinic distances Assistive device utilized: Quad cane small base Level of assistance: Modified independence Comments: slowed gait, decreased foot clearance, ataxic gait, slight shuffling pattern   TODAY'S TREATMENT:                                                                                                                              DATE:  12/19/22 NuStep L 3 x 6 minutes Supine stretching, SKTC, DKTC, LTR, pelvic tilts Supine clamshells against G tband x 10, bridge with hip abd against G tband x 10 reps Seated pelvic tilts,  ant/post x 10 Seated HS stretch, 2 x 30 sec each side. Quick side to side steps over line on floor, CGA with belt, 15 reps Alternating step taps on 4" step, progressing to 3 taps, then tap with rotation, required up to min A for balance, and used UUE support frequently due to instability. Standing march while holding 2# ball in Bue, improved stability noted. Ambulated 1 x 80' while holding the ball, increased foot clearance, speed, and balance noted.   12/06/22- EVAL   PATIENT EDUCATION:  Education details: HEP and POC Person educated: Patient Education  method: Explanation Education comprehension: verbalized understanding  HOME EXERCISE PROGRAM: Access Code: [email or web address] URL: [email or web address] Date: 12/06/2022 Prepared by: Andris Baumann  Exercises - Supine Bridge  - 1 x daily - 7 x weekly - 2 sets - 10 reps - Supine Lower Trunk Rotation  - 1 x daily - 7 x weekly - 2 sets - 10 reps - Sit to Stand with Armchair  - 1 x daily - 7 x weekly - 2 sets - 10 reps - Seated Hip Abduction with Resistance  - 1 x daily - 7 x weekly - 2 sets - 10 reps  ASSESSMENT:  CLINICAL IMPRESSION: Patient reports continued pain. Initiated additional stretch for LB, followed by strengthening for LE and trunk stability, then performed some balance training at the end of treatment.  He has many co morbidities that contribute to his condition and will benefit from PT to work on strength and balance to normalize his gait pattern and address his pain.   OBJECTIVE IMPAIRMENTS: Abnormal gait, decreased balance, decreased coordination, decreased mobility, difficulty walking, decreased ROM, and pain.    PERSONAL FACTORS: Age, Behavior pattern, Social background, Transportation, 3+ comorbidities: DM, active cancer, HTN, chronic pain, and language barrier  are also affecting patient's functional outcome.   REHAB POTENTIAL: Good  CLINICAL DECISION MAKING: Stable/uncomplicated  EVALUATION COMPLEXITY:  Moderate  GOALS: Goals reviewed with patient? Yes  SHORT TERM GOALS: Target date: 01/17/23  Patient will be independent with initial HEP.  Goal status: ongoing  2.  Patient will demonstrate TUG score < 15s to decrease risk for falls Baseline: 24.66s Goal status:ongoing   LONG TERM GOALS: Target date: 02/28/23  Patient will be independent with advanced/ongoing HEP to improve outcomes and carryover.  Goal status: INITIAL  2.  Patient will report 75% improvement in low back pain to improve QOL.  Baseline: 10/10 Goal status: ongoing  3.  Patient will demonstrate full pain free lumbar ROM to perform ADLs.   Goal status: INITIAL  4.  Patient will demonstrate improved functional strength as demonstrated by <15s. Baseline: 25.26s Goal status: INITIAL  5. Patient will be able to walk 532f with LRAD and normalized gait pattern Baseline: using cane and RW  Goal status: INITIAL    PLAN:  PT FREQUENCY: 1-2x/week  PT DURATION: 12 weeks  PLANNED INTERVENTIONS: Therapeutic exercises, Therapeutic activity, Neuromuscular re-education, Balance training, Gait training, Patient/Family education, Self Care, Joint mobilization, Stair training, Cryotherapy, Moist heat, Ionotophoresis '4mg'$ /ml Dexamethasone, and Manual therapy  PLAN FOR NEXT SESSION: light gym activities for low back mobility and strength    SEthel RanaDPT 12/19/22 5:10 PM

## 2022-12-19 NOTE — Telephone Encounter (Signed)
I can't find the message from [facility]. I remember thinking this was a request.

## 2022-12-26 ENCOUNTER — Ambulatory Visit: Payer: 59 | Attending: Family Medicine

## 2022-12-26 DIAGNOSIS — R2689 Other abnormalities of gait and mobility: Secondary | ICD-10-CM | POA: Diagnosis present

## 2022-12-26 DIAGNOSIS — M5459 Other low back pain: Secondary | ICD-10-CM | POA: Insufficient documentation

## 2022-12-26 DIAGNOSIS — M6281 Muscle weakness (generalized): Secondary | ICD-10-CM | POA: Insufficient documentation

## 2022-12-26 DIAGNOSIS — R2681 Unsteadiness on feet: Secondary | ICD-10-CM | POA: Diagnosis present

## 2022-12-26 NOTE — Therapy (Signed)
OUTPATIENT PHYSICAL THERAPY LOWER EXTREMITY TREATMENT   Patient Name: Alec York MRN: [id number] DOB:1951-09-12, 72 y.o., male Today's Date: 12/26/2022  END OF SESSION:  PT End of Session - 12/26/22 1322     Visit Number 3    Date for PT Re-Evaluation 02/28/23    PT Start Time 1315    PT Stop Time 1400    PT Time Calculation (min) 45 min    Activity Tolerance Patient tolerated treatment well    Behavior During Therapy WFL for tasks assessed/performed              Past Medical History:  Diagnosis Date   Allergy    Coronary artery disease    Diabetes mellitus without complication ([place])    Diabetic retinopathy ([place])    Disabled 2/2 retinopathy   GERD (gastroesophageal reflux disease)    Hyperlipidemia    Hypertension    Past Surgical History:  Procedure Laterality Date   CARDIAC CATHETERIZATION     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   CABG x 4 [place]   Patient Active Problem List   Diagnosis Date Noted   Hypertrophic scar 03/10/2022   Health maintenance examination 12/07/2021   Low back pain 09/06/2021   Gait disturbance 09/06/2021   Generalized weakness 09/06/2021   Hiccup 08/10/2021   Diabetes ([place]) 06/24/2021   Non-Hodgkin's lymphoma ([place]) 06/24/2021   Pleural effusion on right 05/13/2021   Insomnia 03/08/2021   Chronic heart failure with preserved ejection fraction ([place]) 01/28/2021   Coronary artery disease involving native heart without angina pectoris 01/28/2021   CLL (chronic lymphocytic leukemia) ([place]) 03/04/2020   Erectile dysfunction 01/10/2020   Dysphagia 04/16/2019   PAD (peripheral artery disease) ([place]) 07/07/2017   Diabetic peripheral neuropathy associated with type 2 diabetes mellitus ([place]) 07/07/2017   Knee pain, chronic 06/15/2017   Adhesive capsulitis of right shoulder 08/19/2015   Bursitis of right shoulder 06/01/2015   Allergic rhinitis 06/01/2015   Acid reflux 05/13/2015   Type 2 diabetes mellitus with diabetic retinopathy  ([place]) 05/13/2015   Essential hypertension 05/13/2015   Hyperlipidemia associated with type 2 diabetes mellitus ([place]) 05/13/2015    PCP: Orvis Brill  REFERRING PROVIDER: Karlton Lemon  REFERRING DIAG: R29.898, G86.761  THERAPY DIAG:  Other abnormalities of gait and mobility  Muscle weakness (generalized)  Unsteadiness on feet  Rationale for Evaluation and Treatment: Rehabilitation  ONSET DATE: 11/07/22  SUBJECTIVE:   SUBJECTIVE STATEMENT: Doing good no pain.   PERTINENT HISTORY: Patient is a 72 y.o. male here for bilateral knee pain, left arm, low back pain. Patient reports he has lost about 28 pounds over the past year related to his treatment for non-hodgkins lymphoma. Over the past several months he has had several aches and pains. Worst of which is in bilateral anterior knees. Worse with walking, standing for prolonged periods. Using a cane to help get around. No acute injury though balance has been an issue, feels he's going to fall. Also with left anterior upper arm pain. No swelling or bruising and no acute injury. Feels more with carrying items and feels weaker. Left sided low back pain without radiation into his legs. No bowel/bladder dysfunction. No numbness/tingling.  PAIN:  Are you having pain? Yes: NPRS scale: 10/10 Pain location: back, L shoulder, both knees Pain description: achy, constant Aggravating factors: everything hurts, walking Relieving factors: Tylenol  PRECAUTIONS: None  WEIGHT BEARING RESTRICTIONS: No  FALLS:  Has patient fallen in last 6 months? No  LIVING ENVIRONMENT: Lives  with: lives with their family Lives in: House/apartment Stairs: Yes: Internal: 14 steps; on right going up Has following equipment at home: Single point cane and Walker - 2 wheeled  OCCUPATION: Retired  PLOF: Independent and Independent with basic ADLs  PATIENT GOALS: to get stronger and walk better.    OBJECTIVE:   DIAGNOSTIC FINDINGS: FINDINGS: Bony  mineralization is within normal limits. There is no fracture or dislocation on this single view. Joint spaces are maintained. No focal osseous lesion. Peripheral vascular calcifications are present.   IMPRESSION: 1. No acute osseous abnormality. 2. Peripheral vascular disease.   COGNITION: Overall cognitive status: Within functional limits for tasks assessed     SENSATION: WFL   POSTURE: rounded shoulders, forward head, and increased thoracic kyphosis  LUMBAR ROM:   AROM eval  Flexion 50% with pain  Extension 50% w/pain  Right lateral flexion Limited with pain  Left lateral flexion Limited with pain  Right rotation 50% w/pain  Left rotation 50% w/pain   (Blank rows = not tested)   LOWER EXTREMITY ROM: WFL bilaterally  LOWER EXTREMITY MMT:  MMT Right eval Left eval  Hip flexion 3+ 3+  Hip extension    Hip abduction    Hip adduction    Hip internal rotation    Hip external rotation    Knee flexion 4 4  Knee extension 4- 4-  Ankle dorsiflexion    Ankle plantarflexion    Ankle inversion    Ankle eversion     (Blank rows = not tested)  FUNCTIONAL TESTS:  5 times sit to stand: 25.26s Timed up and go (TUG): 24.66s  GAIT: Distance walked: in clinic distances Assistive device utilized: Quad cane small base Level of assistance: Modified independence Comments: slowed gait, decreased foot clearance, ataxic gait, slight shuffling pattern   TODAY'S TREATMENT:                                                                                                                              DATE:  12/26/22 NuStep L5 x63mns Box taps 6" minA  LAQ 3# 2x10 3# hip abd 2x10 Standing marches 3# 2x10 holding cane  HS curls red 2x10  STS with chest press red 2x10  Step up 4" stairs   12/19/22 NuStep L 3 x 6 minutes Supine stretching, SKTC, DKTC, LTR, pelvic tilts Supine clamshells against G tband x 10, bridge with hip abd against G tband x 10 reps Seated pelvic tilts,  ant/post x 10 Seated HS stretch, 2 x 30 sec each side. Quick side to side steps over line on floor, CGA with belt, 15 reps Alternating step taps on 4" step, progressing to 3 taps, then tap with rotation, required up to min A for balance, and used UUE support frequently due to instability. Standing march while holding 2# ball in Bue, improved stability noted. Ambulated 1 x 80' while holding the ball, increased foot clearance, speed, and balance noted.   12/06/22-  EVAL   PATIENT EDUCATION:  Education details: HEP and POC Person educated: Patient Education method: Explanation Education comprehension: verbalized understanding  HOME EXERCISE PROGRAM: Access Code: [email or web address] URL: [email or web address] Date: 12/06/2022 Prepared by: Andris Baumann  Exercises - Supine Bridge  - 1 x daily - 7 x weekly - 2 sets - 10 reps - Supine Lower Trunk Rotation  - 1 x daily - 7 x weekly - 2 sets - 10 reps - Sit to Stand with Armchair  - 1 x daily - 7 x weekly - 2 sets - 10 reps - Seated Hip Abduction with Resistance  - 1 x daily - 7 x weekly - 2 sets - 10 reps  ASSESSMENT:  CLINICAL IMPRESSION: Patient reports no pain or falls. Continues to walk with shuffling gait pattern and weakness in BLE. Some hesitation with box taps on 6" but able to do it slowly and with CGA. Focused on strengthening for LE and some balance training.   OBJECTIVE IMPAIRMENTS: Abnormal gait, decreased balance, decreased coordination, decreased mobility, difficulty walking, decreased ROM, and pain.    PERSONAL FACTORS: Age, Behavior pattern, Social background, Transportation, 3+ comorbidities: DM, active cancer, HTN, chronic pain, and language barrier  are also affecting patient's functional outcome.   REHAB POTENTIAL: Good  CLINICAL DECISION MAKING: Stable/uncomplicated  EVALUATION COMPLEXITY: Moderate  GOALS: Goals reviewed with patient? Yes  SHORT TERM GOALS: Target date: 01/17/23  Patient will be  independent with initial HEP.  Goal status: ongoing  2.  Patient will demonstrate TUG score < 15s to decrease risk for falls Baseline: 24.66s Goal status:ongoing   LONG TERM GOALS: Target date: 02/28/23  Patient will be independent with advanced/ongoing HEP to improve outcomes and carryover.  Goal status: INITIAL  2.  Patient will report 75% improvement in low back pain to improve QOL.  Baseline: 10/10 Goal status: ongoing  3.  Patient will demonstrate full pain free lumbar ROM to perform ADLs.   Goal status: INITIAL  4.  Patient will demonstrate improved functional strength as demonstrated by <15s. Baseline: 25.26s Goal status: INITIAL  5. Patient will be able to walk 535f with LRAD and normalized gait pattern Baseline: using cane and RW  Goal status: INITIAL    PLAN:  PT FREQUENCY: 1-2x/week  PT DURATION: 12 weeks  PLANNED INTERVENTIONS: Therapeutic exercises, Therapeutic activity, Neuromuscular re-education, Balance training, Gait training, Patient/Family education, Self Care, Joint mobilization, Stair training, Cryotherapy, Moist heat, Ionotophoresis '4mg'$ /ml Dexamethasone, and Manual therapy  PLAN FOR NEXT SESSION: light gym activities for low back mobility and strength    MFrazier Richards DPT 12/26/22 2:25 PM

## 2022-12-29 ENCOUNTER — Ambulatory Visit: Payer: 59 | Admitting: Student

## 2023-01-02 ENCOUNTER — Ambulatory Visit: Payer: 59

## 2023-01-02 DIAGNOSIS — M5459 Other low back pain: Secondary | ICD-10-CM

## 2023-01-02 DIAGNOSIS — R2681 Unsteadiness on feet: Secondary | ICD-10-CM

## 2023-01-02 DIAGNOSIS — M6281 Muscle weakness (generalized): Secondary | ICD-10-CM

## 2023-01-02 DIAGNOSIS — R2689 Other abnormalities of gait and mobility: Secondary | ICD-10-CM | POA: Diagnosis not present

## 2023-01-02 NOTE — Therapy (Signed)
OUTPATIENT PHYSICAL THERAPY LOWER EXTREMITY TREATMENT   Patient Name: Alec York MRN: [id number] DOB:11/05/1951, 72 y.o., male Today's Date: 01/02/2023  END OF SESSION:  PT End of Session - 01/02/23 1319     Visit Number 4    Date for PT Re-Evaluation 02/28/23    PT Start Time 1315    PT Stop Time 1400    PT Time Calculation (min) 45 min    Activity Tolerance Patient tolerated treatment well    Behavior During Therapy WFL for tasks assessed/performed               Past Medical History:  Diagnosis Date   Allergy    Coronary artery disease    Diabetes mellitus without complication ([place])    Diabetic retinopathy ([place])    Disabled 2/2 retinopathy   GERD (gastroesophageal reflux disease)    Hyperlipidemia    Hypertension    Past Surgical History:  Procedure Laterality Date   CARDIAC CATHETERIZATION     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   CABG x 4 [place]   Patient Active Problem List   Diagnosis Date Noted   Hypertrophic scar 03/10/2022   Health maintenance examination 12/07/2021   Low back pain 09/06/2021   Gait disturbance 09/06/2021   Generalized weakness 09/06/2021   Hiccup 08/10/2021   Diabetes ([place]) 06/24/2021   Non-Hodgkin's lymphoma ([place]) 06/24/2021   Pleural effusion on right 05/13/2021   Insomnia 03/08/2021   Chronic heart failure with preserved ejection fraction ([place]) 01/28/2021   Coronary artery disease involving native heart without angina pectoris 01/28/2021   CLL (chronic lymphocytic leukemia) ([place]) 03/04/2020   Erectile dysfunction 01/10/2020   Dysphagia 04/16/2019   PAD (peripheral artery disease) ([place]) 07/07/2017   Diabetic peripheral neuropathy associated with type 2 diabetes mellitus ([place]) 07/07/2017   Knee pain, chronic 06/15/2017   Adhesive capsulitis of right shoulder 08/19/2015   Bursitis of right shoulder 06/01/2015   Allergic rhinitis 06/01/2015   Acid reflux 05/13/2015   Type 2 diabetes mellitus with diabetic  retinopathy ([place]) 05/13/2015   Essential hypertension 05/13/2015   Hyperlipidemia associated with type 2 diabetes mellitus ([place]) 05/13/2015    PCP: Orvis Brill  REFERRING PROVIDER: Karlton Lemon  REFERRING DIAG: R29.898, [id number]  THERAPY DIAG:  Other abnormalities of gait and mobility  Muscle weakness (generalized)  Unsteadiness on feet  Other low back pain  Rationale for Evaluation and Treatment: Rehabilitation  ONSET DATE: 11/07/22  SUBJECTIVE:   SUBJECTIVE STATEMENT: Not too good, not too bad.   PERTINENT HISTORY: Patient is a 72 y.o. male here for bilateral knee pain, left arm, low back pain. Patient reports he has lost about 28 pounds over the past year related to his treatment for non-hodgkins lymphoma. Over the past several months he has had several aches and pains. Worst of which is in bilateral anterior knees. Worse with walking, standing for prolonged periods. Using a cane to help get around. No acute injury though balance has been an issue, feels he's going to fall. Also with left anterior upper arm pain. No swelling or bruising and no acute injury. Feels more with carrying items and feels weaker. Left sided low back pain without radiation into his legs. No bowel/bladder dysfunction. No numbness/tingling.  PAIN:  Are you having pain? Yes: NPRS scale: 10/10 Pain location: back, L shoulder, both knees Pain description: achy, constant Aggravating factors: everything hurts, walking Relieving factors: Tylenol  PRECAUTIONS: None  WEIGHT BEARING RESTRICTIONS: No  FALLS:  Has patient fallen in  last 6 months? No  LIVING ENVIRONMENT: Lives with: lives with their family Lives in: House/apartment Stairs: Yes: Internal: 14 steps; on right going up Has following equipment at home: Single point cane and Walker - 2 wheeled  OCCUPATION: Retired  PLOF: Independent and Independent with basic ADLs  PATIENT GOALS: to get stronger and walk better.    OBJECTIVE:    DIAGNOSTIC FINDINGS: FINDINGS: Bony mineralization is within normal limits. There is no fracture or dislocation on this single view. Joint spaces are maintained. No focal osseous lesion. Peripheral vascular calcifications are present.   IMPRESSION: 1. No acute osseous abnormality. 2. Peripheral vascular disease.   COGNITION: Overall cognitive status: Within functional limits for tasks assessed     SENSATION: WFL   POSTURE: rounded shoulders, forward head, and increased thoracic kyphosis  LUMBAR ROM:   AROM eval  Flexion 50% with pain  Extension 50% w/pain  Right lateral flexion Limited with pain  Left lateral flexion Limited with pain  Right rotation 50% w/pain  Left rotation 50% w/pain   (Blank rows = not tested)   LOWER EXTREMITY ROM: WFL bilaterally  LOWER EXTREMITY MMT:  MMT Right eval Left eval  Hip flexion 3+ 3+  Hip extension    Hip abduction    Hip adduction    Hip internal rotation    Hip external rotation    Knee flexion 4 4  Knee extension 4- 4-  Ankle dorsiflexion    Ankle plantarflexion    Ankle inversion    Ankle eversion     (Blank rows = not tested)  FUNCTIONAL TESTS:  5 times sit to stand: 25.26s Timed up and go (TUG): 24.66s  GAIT: Distance walked: in clinic distances Assistive device utilized: Quad cane small base Level of assistance: Modified independence Comments: slowed gait, decreased foot clearance, ataxic gait, slight shuffling pattern   TODAY'S TREATMENT:                                                                                                                              DATE:  01/02/23 NuStep L5 x48mns Rows and ext green TB 2x10  Leg ext 5# 2x10 HS curls 15# 2x10 Fitter pushes 2x10 STS 2x10 Step ups 6" Walking on beam   12/26/22 NuStep L5 x623ms Box taps 6" minA  LAQ 3# 2x10 3# hip abd 2x10 Standing marches 3# 2x10 holding cane  HS curls red 2x10  STS with chest press red 2x10 Step up 4"  stairs   12/19/22 NuStep L 3 x 6 minutes Supine stretching, SKTC, DKTC, LTR, pelvic tilts Supine clamshells against G tband x 10, bridge with hip abd against G tband x 10 reps Seated pelvic tilts, ant/post x 10 Seated HS stretch, 2 x 30 sec each side. Quick side to side steps over line on floor, CGA with belt, 15 reps Alternating step taps on 4" step, progressing to 3 taps, then tap with rotation, required up to min A  for balance, and used UUE support frequently due to instability. Standing march while holding 2# ball in Bue, improved stability noted. Ambulated 1 x 80' while holding the ball, increased foot clearance, speed, and balance noted.   12/06/22- EVAL   PATIENT EDUCATION:  Education details: HEP and POC Person educated: Patient Education method: Explanation Education comprehension: verbalized understanding  HOME EXERCISE PROGRAM: Access Code: [email or web address] URL: [email or web address] Date: 12/06/2022 Prepared by: Andris Baumann  Exercises - Supine Bridge  - 1 x daily - 7 x weekly - 2 sets - 10 reps - Supine Lower Trunk Rotation  - 1 x daily - 7 x weekly - 2 sets - 10 reps - Sit to Stand with Armchair  - 1 x daily - 7 x weekly - 2 sets - 10 reps - Seated Hip Abduction with Resistance  - 1 x daily - 7 x weekly - 2 sets - 10 reps  ASSESSMENT:  CLINICAL IMPRESSION: Patient reports no pain or falls. Continues to walk with shuffling gait pattern and weakness in BLE. We worked on [place] today with low weights. Added some balance training on the beam, unable to do without UE support.   OBJECTIVE IMPAIRMENTS: Abnormal gait, decreased balance, decreased coordination, decreased mobility, difficulty walking, decreased ROM, and pain.    PERSONAL FACTORS: Age, Behavior pattern, Social background, Transportation, 3+ comorbidities: DM, active cancer, HTN, chronic pain, and language barrier  are also affecting patient's functional outcome.   REHAB POTENTIAL:  Good  CLINICAL DECISION MAKING: Stable/uncomplicated  EVALUATION COMPLEXITY: Moderate  GOALS: Goals reviewed with patient? Yes  SHORT TERM GOALS: Target date: 01/17/23  Patient will be independent with initial HEP.  Goal status: ongoing  2.  Patient will demonstrate TUG score < 15s to decrease risk for falls Baseline: 24.66s Goal status:ongoing   LONG TERM GOALS: Target date: 02/28/23  Patient will be independent with advanced/ongoing HEP to improve outcomes and carryover.  Goal status: INITIAL  2.  Patient will report 75% improvement in low back pain to improve QOL.  Baseline: 10/10 Goal status: ongoing  3.  Patient will demonstrate full pain free lumbar ROM to perform ADLs.   Goal status: INITIAL  4.  Patient will demonstrate improved functional strength as demonstrated by <15s. Baseline: 25.26s Goal status: INITIAL  5. Patient will be able to walk 529f with LRAD and normalized gait pattern Baseline: using cane and RW  Goal status: INITIAL    PLAN:  PT FREQUENCY: 1-2x/week  PT DURATION: 12 weeks  PLANNED INTERVENTIONS: Therapeutic exercises, Therapeutic activity, Neuromuscular re-education, Balance training, Gait training, Patient/Family education, Self Care, Joint mobilization, Stair training, Cryotherapy, Moist heat, Ionotophoresis 497mml Dexamethasone, and Manual therapy  PLAN FOR NEXT SESSION: light gym activities for low back mobility and strength    MoFrazier RichardsDPT 01/02/23 2:00 PM

## 2023-01-09 ENCOUNTER — Ambulatory Visit: Payer: 59

## 2023-01-09 ENCOUNTER — Ambulatory Visit: Payer: 59 | Admitting: Student

## 2023-01-12 ENCOUNTER — Ambulatory Visit (INDEPENDENT_AMBULATORY_CARE_PROVIDER_SITE_OTHER): Payer: 59 | Admitting: Student

## 2023-01-12 ENCOUNTER — Encounter: Payer: Self-pay | Admitting: Student

## 2023-01-12 VITALS — BP 135/69 | HR 79 | Ht 68.0 in | Wt 130.2 lb

## 2023-01-12 DIAGNOSIS — L608 Other nail disorders: Secondary | ICD-10-CM

## 2023-01-12 DIAGNOSIS — R140 Abdominal distension (gaseous): Secondary | ICD-10-CM | POA: Diagnosis not present

## 2023-01-12 DIAGNOSIS — R0600 Dyspnea, unspecified: Secondary | ICD-10-CM

## 2023-01-12 NOTE — Progress Notes (Signed)
    SUBJECTIVE:   CHIEF COMPLAINT / HPI:   Alec York is a 72 year old male here to discuss shortness of breath and abdominal pain/bloating.  Shortness of breath Described as difficulty taking a deep breath No fevers or chills.  Says that the inside of his chest feels "itchy" Has had a little bit of a dry cough. No sick contacts Has been ambulatory and walking around without difficulty  Abdominal bloating For the past week or so feels like his belly has been bloated and a little bit painful because of this Passing gas as usual, last bowel movement was yesterday No blood in the stool No nausea or vomiting  PERTINENT  PMH / PSH: Lymphoma (stage IV), type 2 diabetes mellitus  OBJECTIVE:   BP 135/69   Pulse 79   Ht 5' 8"$  (1.727 m)   Wt 130 lb 3.2 oz (59.1 kg)   SpO2 100%   BMI 19.80 kg/m   General: Alert and cooperative and appears to be in no acute distress Cardio: Normal S1 and S2, no S3 or S4. Rhythm is regular. No murmurs or rubs.   Pulm: Clear to auscultation bilaterally, no crackles, wheezing, or diminished breath sounds. Normal respiratory effort Abdomen: Bowel sounds normal. Abdomen soft and non-tender. Mild-moderate distention. Extremities: No peripheral edema. Warm/ well perfused.  Strong radial pulses. Neuro: Cranial nerves grossly intact   ASSESSMENT/PLAN:   Dyspnea Has a feeling itchiness in the chest as well as discomfort with taking a deep breath for the past few days Well-appearing, lung exam is completely normal Will get chest x-ray to evaluate further. Consider pneumonia, bronchitis, pleural effusion. Considered PE given malignancy, but less likely given that his vital signs are all within normal limits (SpO2 99% on room air, no tachycardia) Return precautions discussed at length   Abdominal bloating Patient reports uncomfortable bloating sensation in abdomen Abdomen is mild to moderately distended on exam.  Bowel sounds are normal. Considered small  bowel obstruction, but not likely given recent bowel movement and passing flatus. Will get KUB.  Could have stool burden that needs bowel cleanout with MiraLAX.  Will follow-up on imaging.     Alec York, Alec York

## 2023-01-12 NOTE — Patient Instructions (Addendum)
It was great seeing you today.  I placed a referral to podiatry for your toenail. They will call you to schedule your appointment.  Please go to [facility] to get your X-rays completed.   If you have any questions or concerns, please feel free to call the clinic.    Be well,  Dr. Orvis Brill [facility] [phone]

## 2023-01-13 ENCOUNTER — Ambulatory Visit
Admission: RE | Admit: 2023-01-13 | Discharge: 2023-01-13 | Disposition: A | Discharge status: Home / Self Care | Payer: 59 | Source: Ambulatory Visit | Attending: Family Medicine | Admitting: Family Medicine

## 2023-01-13 DIAGNOSIS — R140 Abdominal distension (gaseous): Secondary | ICD-10-CM

## 2023-01-13 DIAGNOSIS — R0600 Dyspnea, unspecified: Secondary | ICD-10-CM | POA: Insufficient documentation

## 2023-01-13 NOTE — Assessment & Plan Note (Signed)
Patient reports uncomfortable bloating sensation in abdomen Abdomen is mild to moderately distended on exam.  Bowel sounds are normal. Considered small bowel obstruction, but not likely given recent bowel movement and passing flatus. Will get KUB.  Could have stool burden that needs bowel cleanout with MiraLAX.  Will follow-up on imaging.

## 2023-01-13 NOTE — Assessment & Plan Note (Signed)
Has a feeling itchiness in the chest as well as discomfort with taking a deep breath for the past few days Well-appearing, lung exam is completely normal Will get chest x-ray to evaluate further. Consider pneumonia, bronchitis, pleural effusion. Considered PE given malignancy, but less likely given that his vital signs are all within normal limits (SpO2 99% on room air, no tachycardia) Return precautions discussed at length

## 2023-01-16 ENCOUNTER — Telehealth: Payer: Self-pay

## 2023-01-16 ENCOUNTER — Ambulatory Visit: Payer: 59

## 2023-01-16 DIAGNOSIS — R2689 Other abnormalities of gait and mobility: Secondary | ICD-10-CM | POA: Diagnosis not present

## 2023-01-16 DIAGNOSIS — M6281 Muscle weakness (generalized): Secondary | ICD-10-CM

## 2023-01-16 DIAGNOSIS — R2681 Unsteadiness on feet: Secondary | ICD-10-CM

## 2023-01-16 DIAGNOSIS — J90 Pleural effusion, not elsewhere classified: Secondary | ICD-10-CM

## 2023-01-16 NOTE — Therapy (Signed)
OUTPATIENT PHYSICAL THERAPY LOWER EXTREMITY TREATMENT   Patient Name: Alec York MRN: [id number] DOB:02-13-1951, 72 y.o., male Today's Date: 01/16/2023  END OF SESSION:  PT End of Session - 01/16/23 0930     Visit Number 5    Date for PT Re-Evaluation 02/28/23    PT Start Time 0930    PT Stop Time [id number]    PT Time Calculation (min) 45 min    Activity Tolerance Patient tolerated treatment well    Behavior During Therapy WFL for tasks assessed/performed               Past Medical History:  Diagnosis Date   Allergy    Coronary artery disease    Diabetes mellitus without complication ([place])    Diabetic retinopathy ([place])    Disabled 2/2 retinopathy   GERD (gastroesophageal reflux disease)    Hyperlipidemia    Hypertension    Past Surgical History:  Procedure Laterality Date   CARDIAC CATHETERIZATION     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   CABG x 4 [place]   Patient Active Problem List   Diagnosis Date Noted   Dyspnea 01/13/2023   Abdominal bloating 01/13/2023   Hypertrophic scar 03/10/2022   Health maintenance examination 12/07/2021   Low back pain 09/06/2021   Gait disturbance 09/06/2021   Generalized weakness 09/06/2021   Non-Hodgkin's lymphoma ([place]) 06/24/2021   Pleural effusion on right 05/13/2021   Chronic heart failure with preserved ejection fraction ([place]) 01/28/2021   Coronary artery disease involving native heart without angina pectoris 01/28/2021   CLL (chronic lymphocytic leukemia) ([place]) 03/04/2020   Erectile dysfunction 01/10/2020   PAD (peripheral artery disease) ([place]) 07/07/2017   Knee pain, chronic 06/15/2017   Type 2 diabetes mellitus with diabetic retinopathy ([place]) 05/13/2015   Essential hypertension 05/13/2015    PCP: Orvis Brill  REFERRING PROVIDER: Karlton Lemon  REFERRING DIAG: R29.898, [id number]  THERAPY DIAG:  Other abnormalities of gait and mobility  Muscle weakness (generalized)  Unsteadiness on  feet  Rationale for Evaluation and Treatment: Rehabilitation  ONSET DATE: 11/07/22  SUBJECTIVE:   SUBJECTIVE STATEMENT: Not doing good, there is no recovery.   PERTINENT HISTORY: Patient is a 72 y.o. male here for bilateral knee pain, left arm, low back pain. Patient reports he has lost about 28 pounds over the past year related to his treatment for non-hodgkins lymphoma. Over the past several months he has had several aches and pains. Worst of which is in bilateral anterior knees. Worse with walking, standing for prolonged periods. Using a cane to help get around. No acute injury though balance has been an issue, feels he's going to fall. Also with left anterior upper arm pain. No swelling or bruising and no acute injury. Feels more with carrying items and feels weaker. Left sided low back pain without radiation into his legs. No bowel/bladder dysfunction. No numbness/tingling.  PAIN:  Are you having pain? Yes: NPRS scale: 0/10 Pain location: back, L shoulder, both knees Pain description: achy, constant Aggravating factors: everything hurts, walking Relieving factors: Tylenol  PRECAUTIONS: None  WEIGHT BEARING RESTRICTIONS: No  FALLS:  Has patient fallen in last 6 months? No  LIVING ENVIRONMENT: Lives with: lives with their family Lives in: House/apartment Stairs: Yes: Internal: 14 steps; on right going up Has following equipment at home: Single point cane and Walker - 2 wheeled  OCCUPATION: Retired  PLOF: Independent and Independent with basic ADLs  PATIENT GOALS: to get stronger and walk better.  OBJECTIVE:   DIAGNOSTIC FINDINGS: FINDINGS: Bony mineralization is within normal limits. There is no fracture or dislocation on this single view. Joint spaces are maintained. No focal osseous lesion. Peripheral vascular calcifications are present.   IMPRESSION: 1. No acute osseous abnormality. 2. Peripheral vascular disease.   COGNITION: Overall cognitive  status: Within functional limits for tasks assessed     SENSATION: WFL   POSTURE: rounded shoulders, forward head, and increased thoracic kyphosis  LUMBAR ROM:   AROM eval  Flexion 50% with pain  Extension 50% w/pain  Right lateral flexion Limited with pain  Left lateral flexion Limited with pain  Right rotation 50% w/pain  Left rotation 50% w/pain   (Blank rows = not tested)   LOWER EXTREMITY ROM: WFL bilaterally  LOWER EXTREMITY MMT:  MMT Right eval Left eval  Hip flexion 3+ 3+  Hip extension    Hip abduction    Hip adduction    Hip internal rotation    Hip external rotation    Knee flexion 4 4  Knee extension 4- 4-  Ankle dorsiflexion    Ankle plantarflexion    Ankle inversion    Ankle eversion     (Blank rows = not tested)  FUNCTIONAL TESTS:  5 times sit to stand: 25.26s Timed up and go (TUG): 24.66s  GAIT: Distance walked: in clinic distances Assistive device utilized: Quad cane small base Level of assistance: Modified independence Comments: slowed gait, decreased foot clearance, ataxic gait, slight shuffling pattern   TODAY'S TREATMENT:                                                                                                                              DATE:  01/16/23 Bike L3 x68mns Shoulder ext 5# 2x10 Cable rows 5# 2x10  Leg press 20# 2x10 Calf raises 2x10  STS on airex 2x10 SLR 2# 2x10 Sidelying abd 2# 2x10    01/02/23 NuStep L5 x639ms Rows and ext green TB 2x10  Leg ext 5# 2x10 HS curls 15# 2x10 Fitter pushes 2x10 STS 2x10 Step ups 6" Walking on beam   12/26/22 NuStep L5 x6m10m Box taps 6" minA  LAQ 3# 2x10 3# hip abd 2x10 Standing marches 3# 2x10 holding cane  HS curls red 2x10  STS with chest press red 2x10 Step up 4" stairs   12/19/22 NuStep L 3 x 6 minutes Supine stretching, SKTC, DKTC, LTR, pelvic tilts Supine clamshells against G tband x 10, bridge with hip abd against G tband x 10 reps Seated pelvic tilts,  ant/post x 10 Seated HS stretch, 2 x 30 sec each side. Quick side to side steps over line on floor, CGA with belt, 15 reps Alternating step taps on 4" step, progressing to 3 taps, then tap with rotation, required up to min A for balance, and used UUE support frequently due to instability. Standing march while holding 2# ball in Bue, improved stability noted. Ambulated 1 x 80' while  holding the ball, increased foot clearance, speed, and balance noted.   12/06/22- EVAL   PATIENT EDUCATION:  Education details: HEP and POC Person educated: Patient Education method: Explanation Education comprehension: verbalized understanding  HOME EXERCISE PROGRAM: Access Code: [email or web address] URL: [email or web address] Date: 12/06/2022 Prepared by: Andris Baumann  Exercises - Supine Bridge  - 1 x daily - 7 x weekly - 2 sets - 10 reps - Supine Lower Trunk Rotation  - 1 x daily - 7 x weekly - 2 sets - 10 reps - Sit to Stand with Armchair  - 1 x daily - 7 x weekly - 2 sets - 10 reps - Seated Hip Abduction with Resistance  - 1 x daily - 7 x weekly - 2 sets - 10 reps  ASSESSMENT:  CLINICAL IMPRESSION: Patient reports no pain or falls. Continues to walk with shuffling gait pattern and weakness in BLE. Difficulty keeping balance with cable exercises, needs extra steps to regain balance and CGA, even on low weight. Cues needs with most interventions, both verbal and tactile. Unsteady with STS on airex, requires CGA- minA.    OBJECTIVE IMPAIRMENTS: Abnormal gait, decreased balance, decreased coordination, decreased mobility, difficulty walking, decreased ROM, and pain.    PERSONAL FACTORS: Age, Behavior pattern, Social background, Transportation, 3+ comorbidities: DM, active cancer, HTN, chronic pain, and language barrier  are also affecting patient's functional outcome.   REHAB POTENTIAL: Good  CLINICAL DECISION MAKING: Stable/uncomplicated  EVALUATION COMPLEXITY: Moderate  GOALS: Goals  reviewed with patient? Yes  SHORT TERM GOALS: Target date: 01/17/23  Patient will be independent with initial HEP.  Goal status: ongoing  2.  Patient will demonstrate TUG score < 15s to decrease risk for falls Baseline: 24.66s Goal status:ongoing   LONG TERM GOALS: Target date: 02/28/23  Patient will be independent with advanced/ongoing HEP to improve outcomes and carryover.  Goal status: INITIAL  2.  Patient will report 75% improvement in low back pain to improve QOL.  Baseline: 10/10 Goal status: ongoing  3.  Patient will demonstrate full pain free lumbar ROM to perform ADLs.   Goal status: INITIAL  4.  Patient will demonstrate improved functional strength as demonstrated by <15s. Baseline: 25.26s Goal status: INITIAL  5. Patient will be able to walk 527f with LRAD and normalized gait pattern Baseline: using cane and RW  Goal status: INITIAL    PLAN:  PT FREQUENCY: 1-2x/week  PT DURATION: 12 weeks  PLANNED INTERVENTIONS: Therapeutic exercises, Therapeutic activity, Neuromuscular re-education, Balance training, Gait training, Patient/Family education, Self Care, Joint mobilization, Stair training, Cryotherapy, Moist heat, Ionotophoresis '4mg'$ /ml Dexamethasone, and Manual therapy  PLAN FOR NEXT SESSION: light gym activities for low back mobility and strength    MFrazier Richards DPT 01/16/23 10:17 AM

## 2023-01-16 NOTE — Telephone Encounter (Signed)
Patient calls nurse line requesting results from recent imaging.   Will forward to PCP.

## 2023-01-18 ENCOUNTER — Telehealth: Payer: Self-pay | Admitting: Student

## 2023-01-18 MED ORDER — POLYETHYLENE GLYCOL 3350 17 GM/SCOOP PO POWD: 17.0000 g | Freq: Every day | ORAL | 0 refills | Status: DC

## 2023-01-18 NOTE — Telephone Encounter (Signed)
Called Mr. Ledon to discuss CXR and KUB results.  Encouraged to use Miralax daily for constipation. Sent to pharmacy.  Regarding CXR showing bibasilar consolidation or volume loss with small bilateral pleural effusions, he is not coughing or feeling generally ill, but given difficulty with taking deep breaths and stage IV nodal marginal zone lymphoma, will f/u with CT chest w/o contrast. Ordered. Discussed with patient and preceptor.

## 2023-01-18 NOTE — Addendum Note (Signed)
Addended by: Orvis Brill on: 01/18/2023 10:49 AM   Modules accepted: Orders

## 2023-01-18 NOTE — Telephone Encounter (Signed)
Patient returns call to nurse line stating that he missed a call from Dr. Owens Shark regarding results.  Please return call to patient at [phone].  Talbot Grumbling, RN

## 2023-01-18 NOTE — Addendum Note (Signed)
Addended by: Orvis Brill on: 01/18/2023 10:46 AM   Modules accepted: Orders

## 2023-01-19 NOTE — Telephone Encounter (Signed)
Called pt and LVM to inform him of his scheduled CT appt:  [facility] Friday March 1st Arrive at 4 for a 4:30 scan.  Ottis Stain, CMA

## 2023-01-20 ENCOUNTER — Ambulatory Visit (HOSPITAL_COMMUNITY): Payer: 59

## 2023-01-20 ENCOUNTER — Ambulatory Visit (INDEPENDENT_AMBULATORY_CARE_PROVIDER_SITE_OTHER): Payer: 59 | Admitting: Podiatry

## 2023-01-20 DIAGNOSIS — M79674 Pain in right toe(s): Secondary | ICD-10-CM | POA: Diagnosis not present

## 2023-01-20 DIAGNOSIS — E0843 Diabetes mellitus due to underlying condition with diabetic autonomic (poly)neuropathy: Secondary | ICD-10-CM

## 2023-01-20 DIAGNOSIS — B351 Tinea unguium: Secondary | ICD-10-CM

## 2023-01-20 DIAGNOSIS — M79675 Pain in left toe(s): Secondary | ICD-10-CM | POA: Diagnosis not present

## 2023-01-22 NOTE — Progress Notes (Signed)
   Chief Complaint  Patient presents with   Diabetes    Diabetic foot care, A1c- 7.4 BG- 154, Nail trim, Diabetic shoes, right hallux nail has come off,     HPI: 72 y.o. male presenting today for evaluation of routine footcare.  Patient last seen here in our office on 05/10/2018.  That same year he also received a pair of diabetic shoes with custom molded insoles.  Presenting today for further treatment evaluation  Past Medical History:  Diagnosis Date   Allergy    Coronary artery disease    Diabetes mellitus without complication ([place])    Diabetic retinopathy ([place])    Disabled 2/2 retinopathy   GERD (gastroesophageal reflux disease)    Hyperlipidemia    Hypertension     Past Surgical History:  Procedure Laterality Date   CARDIAC CATHETERIZATION     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   CABG x 4 [place]   No Known Allergies   Physical Exam: General: The patient is alert and oriented x3 in no acute distress.  Dermatology: Skin is warm, dry and supple bilateral lower extremities.  Hyperkeratotic dystrophic nails noted bilateral.  Idiopathic loss of the left hallux nail plate also noted with regrowth of the new nail  Vascular: Palpable pedal pulses bilaterally. Capillary refill within normal limits.  Negative for any significant edema or erythema  Neurological: Light touch and protective threshold diminished  Musculoskeletal Exam: No pedal deformities noted.  No prior amputations  Assessment: 1.  Diabetes mellitus with peripheral polyneuropathy 2.  Pain due to onychomycosis of toenails both  Plan of Care:  1. Patient evaluated.  Comprehensive diabetic foot exam performed today 2.  Mechanical debridement of nails 1-5 bilateral performed using a nail nipper without incident or bleeding 3.  Appointment with diabetic shoe department for diabetic shoes with custom molded insoles 4.  Return to clinic annually      Edrick Kins, DPM [facility]  Dr.  Edrick Kins, DPM    [address], [place] [address]                Office [phone]  Fax [phone]

## 2023-01-23 NOTE — Telephone Encounter (Signed)
Patient calls nurse line in regards to CT scan.   Patient missed apt on 3/1 due to "not knowing." Patient asks I reschedule him.   Patient has been rescheduled for 3/18 @ 8am at [place].   I called patient and discussed date and time with him and his case worker.   Patient appreciative.

## 2023-02-03 ENCOUNTER — Ambulatory Visit: Payer: 59

## 2023-02-03 ENCOUNTER — Ambulatory Visit (INDEPENDENT_AMBULATORY_CARE_PROVIDER_SITE_OTHER): Payer: 59 | Admitting: Podiatry

## 2023-02-03 DIAGNOSIS — E0843 Diabetes mellitus due to underlying condition with diabetic autonomic (poly)neuropathy: Secondary | ICD-10-CM | POA: Diagnosis not present

## 2023-02-03 DIAGNOSIS — I70223 Atherosclerosis of native arteries of extremities with rest pain, bilateral legs: Secondary | ICD-10-CM | POA: Diagnosis not present

## 2023-02-03 NOTE — Progress Notes (Signed)
   Chief Complaint  Patient presents with   Diabetes    Patient came in today for right hallux pain,  sharp pain at night inside the toe, Diabetic- A1c-7.4 BG-200(yesterday) Numbness,  Diabetic shoe fitting today     HPI: 72 y.o. male PMHx T2DM, CAD presenting today for evaluation of pain and tenderness to the right lower extremity.  Patient states that he has intermittent pain to his toes especially at nighttime when he sleeps as well as calf pain throughout the day.  Denies a history of injury.  Presenting for further treatment evaluation  Past Medical History:  Diagnosis Date   Allergy    Coronary artery disease    Diabetes mellitus without complication ([place])    Diabetic retinopathy ([place])    Disabled 2/2 retinopathy   GERD (gastroesophageal reflux disease)    Hyperlipidemia    Hypertension     Past Surgical History:  Procedure Laterality Date   CARDIAC CATHETERIZATION     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   CABG x 4 [place]   No Known Allergies   Physical Exam: General: The patient is alert and oriented x3 in no acute distress.  Dermatology: Skin is warm, dry and supple bilateral lower extremities.  Idiopathic loss of the left hallux nail plate also noted with regrowth of the new nail  Vascular: Pulses seem to be diminished today.  Skin cool to touch.  No edema or erythema.  Capillary refill delayed  Neurological: Light touch and protective threshold diminished  Musculoskeletal Exam: No pedal deformities noted.  No prior amputations  Assessment: 1.  Diabetes mellitus with peripheral polyneuropathy 2.  Concern for possible ischemia bilateral lower extremities  Plan of Care:  1. Patient evaluated.   2.  Order placed for ABIs to establish vascular baseline 3.  Today the patient was fitted for custom molded diabetic insoles and shoes however the patient ultimately declined 4.  Return to clinic annually      Edrick Kins, DPM [facility]  Dr.  Edrick Kins, DPM    [address], [place] [address]                Office [phone]  Fax [phone]

## 2023-02-06 ENCOUNTER — Ambulatory Visit (HOSPITAL_COMMUNITY)
Admission: RE | Admit: 2023-02-06 | Discharge: 2023-02-06 | Disposition: A | Discharge status: Home / Self Care | Payer: 59 | Source: Ambulatory Visit | Attending: Family Medicine | Admitting: Family Medicine

## 2023-02-06 ENCOUNTER — Telehealth: Payer: Self-pay | Admitting: Hematology

## 2023-02-06 DIAGNOSIS — J90 Pleural effusion, not elsewhere classified: Secondary | ICD-10-CM | POA: Diagnosis present

## 2023-02-06 NOTE — Telephone Encounter (Signed)
Per 3/18 IB reached out to patient. Patient decided he wanted to keep appointment.

## 2023-02-08 ENCOUNTER — Telehealth: Payer: Self-pay

## 2023-02-08 NOTE — Telephone Encounter (Signed)
Patient calls nurse line requesting results from CT chest.   Please return call to patient at [phone].  Talbot Grumbling, RN

## 2023-02-09 NOTE — Telephone Encounter (Signed)
Patient LVM on nurse line requesting CT results.

## 2023-02-10 ENCOUNTER — Telehealth: Payer: Self-pay | Admitting: Hematology

## 2023-02-10 ENCOUNTER — Telehealth: Payer: Self-pay | Admitting: Student

## 2023-02-10 NOTE — Telephone Encounter (Signed)
Called patient to discuss lung CT findings. No pneumonia.  He has progression of lymph node size possibly indicative of progression of lymphoma. Already has an oncologist appointment scheduled for 4/19, but will message them about CT.  Orvis Brill, DO

## 2023-02-10 NOTE — Telephone Encounter (Signed)
Patient called to try to get sooner appointment to go over CT results. Informed MD will be leaving for vacation. Informed patient if sooner appointment opens will call.

## 2023-02-27 ENCOUNTER — Ambulatory Visit (HOSPITAL_COMMUNITY): Payer: 59

## 2023-03-09 ENCOUNTER — Other Ambulatory Visit: Payer: Self-pay

## 2023-03-09 DIAGNOSIS — C8590 Non-Hodgkin lymphoma, unspecified, unspecified site: Secondary | ICD-10-CM

## 2023-03-10 ENCOUNTER — Other Ambulatory Visit: Payer: Self-pay

## 2023-03-10 ENCOUNTER — Inpatient Hospital Stay: Payer: 59

## 2023-03-10 ENCOUNTER — Inpatient Hospital Stay: Payer: 59 | Attending: Hematology | Admitting: Hematology

## 2023-03-10 VITALS — BP 142/67 | HR 86 | Temp 97.5°F | Resp 16 | Wt 131.3 lb

## 2023-03-10 DIAGNOSIS — F411 Generalized anxiety disorder: Secondary | ICD-10-CM | POA: Insufficient documentation

## 2023-03-10 DIAGNOSIS — C8590 Non-Hodgkin lymphoma, unspecified, unspecified site: Secondary | ICD-10-CM | POA: Diagnosis not present

## 2023-03-10 DIAGNOSIS — E1165 Type 2 diabetes mellitus with hyperglycemia: Secondary | ICD-10-CM | POA: Diagnosis not present

## 2023-03-10 DIAGNOSIS — Z87891 Personal history of nicotine dependence: Secondary | ICD-10-CM | POA: Insufficient documentation

## 2023-03-10 LAB — CMP (CANCER CENTER ONLY)
ALT: 11 U/L (ref 0–44)
AST: 15 U/L (ref 15–41)
Albumin: 3.9 g/dL (ref 3.5–5.0)
Alkaline Phosphatase: 75 U/L (ref 38–126)
Anion gap: 7 (ref 5–15)
BUN: 14 mg/dL (ref 8–23)
CO2: 28 mmol/L (ref 22–32)
Calcium: 9.3 mg/dL (ref 8.9–10.3)
Chloride: 102 mmol/L (ref 98–111)
Creatinine: 0.92 mg/dL (ref 0.61–1.24)
GFR, Estimated: >60 mL/min (ref >60)
Glucose, Bld: 186 mg/dL — ABNORMAL HIGH (ref 70–99)
Potassium: 4.2 mmol/L (ref 3.5–5.1)
Sodium: 137 mmol/L (ref 135–145)
Total Bilirubin: 0.3 mg/dL (ref 0.3–1.2)
Total Protein: 7.4 g/dL (ref 6.5–8.1)

## 2023-03-10 LAB — CBC WITH DIFFERENTIAL (CANCER CENTER ONLY)
Abs Immature Granulocytes: 0.05 10*3/uL (ref 0.00–0.07)
Basophils Absolute: 0.1 10*3/uL (ref 0.0–0.1)
Basophils Relative: 1 %
Eosinophils Absolute: 0.4 10*3/uL (ref 0.0–0.5)
Eosinophils Relative: 3 %
HCT: 37.4 % — ABNORMAL LOW (ref 39.0–52.0)
Hemoglobin: 12 g/dL — ABNORMAL LOW (ref 13.0–17.0)
Immature Granulocytes: 0 %
Lymphocytes Relative: 37 %
Lymphs Abs: 5 10*3/uL — ABNORMAL HIGH (ref 0.7–4.0)
MCH: 26.2 pg (ref 26.0–34.0)
MCHC: 32.1 g/dL (ref 30.0–36.0)
MCV: 81.7 fL (ref 80.0–100.0)
Monocytes Absolute: 1.4 10*3/uL — ABNORMAL HIGH (ref 0.1–1.0)
Monocytes Relative: 10 %
Neutro Abs: 6.7 10*3/uL (ref 1.7–7.7)
Neutrophils Relative %: 49 %
Platelet Count: 256 10*3/uL (ref 150–400)
RBC: 4.58 MIL/uL (ref 4.22–5.81)
RDW: 15 % (ref 11.5–15.5)
WBC Count: 13.6 10*3/uL — ABNORMAL HIGH (ref 4.0–10.5)
nRBC: 0 % (ref 0.0–0.2)

## 2023-03-10 LAB — VITAMIN B12: Vitamin B-12: 142 pg/mL — ABNORMAL LOW (ref 180–914)

## 2023-03-10 LAB — LACTATE DEHYDROGENASE: LDH: 129 U/L (ref 98–192)

## 2023-03-10 LAB — FERRITIN: Ferritin: 36 ng/mL (ref 24–336)

## 2023-03-10 NOTE — Progress Notes (Signed)
HEMATOLOGY/ONCOLOGY CLINIC NOTE  Date of Service: 03/10/23   Patient Care Team: Darral Dash, DO as PCP - General (Family Medicine) O'Neal, Ronnald Ramp, MD as Consulting Physician (Cardiology) Johney Maine, MD as Consulting Physician (Hematology) Luciano Cutter, MD as Consulting Physician (Pulmonary Disease)  Suzanna Obey, MD as ENT  CHIEF COMPLAINTS/PURPOSE OF CONSULTATION:  Follow-up for continued evaluation and management of low-grade non-Hodgkin's lymphoma  CURRENT TREATMENT: Watchful observation  PREVIOUS TREATMENT Rituxan weekly x 4 doses  INTERVAL HISTORY:   Alec York iis here for continued evaluation and management of his notable marginal zone lymphoma. Patient was last seen by me on 09/23/2022 and was doing well overall with no new medical concerns.   Today, he is accompanied by his wife. He reports that he is feeling well overall and denies any new symptoms. No night sweats, fevers, or chills.no new LNadenopathy or hepatosplenomegaly.   He reports that one of his younger sons recently passed away from PE/COVID-19 infection.  MEDICAL HISTORY:  Past Medical History:  Diagnosis Date   Allergy    Coronary artery disease    Diabetes mellitus without complication (HCC)    Diabetic retinopathy (HCC)    Disabled 2/2 retinopathy   GERD (gastroesophageal reflux disease)    Hyperlipidemia    Hypertension     SURGICAL HISTORY: Past Surgical History:  Procedure Laterality Date   CARDIAC CATHETERIZATION     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   CABG x 4 [place]    SOCIAL HISTORY: Social History   Socioeconomic History   Marital status: Married    Spouse name: [name]   Number of children: 2   Years of education: 16   Highest education level: Bachelor's degree (e.g., BA, AB, BS)  Occupational History   Occupation: Retired  Tobacco Use   Smoking status: Former    Packs/day: 0.50    Years: 10.00    Additional pack years: 0.00     Total pack years: 5.00    Types: Cigarettes    Start date: 41    Quit date: 2010    Years since quitting: 14.3    Passive exposure: Past   Smokeless tobacco: Never  Vaping Use   Vaping Use: Never used  Substance and Sexual Activity   Alcohol use: No    Alcohol/week: 0.0 standard drinks of alcohol   Drug use: No   Sexual activity: Yes    Birth control/protection: None  Other Topics Concern   Not on file  Social History Narrative   Patient lives with his wife Alec York [facility] patient.)    Patient has 2 children, one [place] and one [place].    Patient uses [employer] transportation and Medicare transportation.    College in [place].   Social Determinants of Health   Financial Resource Strain: Low Risk  (01/12/2022)   Overall Financial Resource Strain (CARDIA)    Difficulty of Paying Living Expenses: Not hard at all  Food Insecurity: No Food Insecurity (01/12/2022)   Hunger Vital Sign    Worried About Running Out of Food in the Last Year: Never true    Ran Out of Food in the Last Year: Never true  Transportation Needs: No Transportation Needs (01/12/2022)   PRAPARE - Administrator, Civil Service (Medical): No    Lack of Transportation (Non-Medical): No  Physical Activity: Insufficiently Active (01/12/2022)   Exercise Vital Sign    Days of Exercise per Week: 7 days    Minutes of  Exercise per Session: 10 min  Stress: No Stress Concern Present (01/12/2022)   [employer] of Occupational Health - Occupational Stress Questionnaire    Feeling of Stress : Not at all  Social Connections: Moderately Isolated (01/12/2022)   Social Connection and Isolation Panel [NHANES]    Frequency of Communication with Friends and Family: Twice a week    Frequency of Social Gatherings with Friends and Family: More than three times a week    Attends Religious Services: Never    Database administrator or Organizations: No    Attends Banker Meetings: Never    Marital Status: Married   Catering manager Violence: Not At Risk (01/12/2022)   Humiliation, Afraid, Rape, and Kick questionnaire    Fear of Current or Ex-Partner: No    Emotionally Abused: No    Physically Abused: No    Sexually Abused: No    FAMILY HISTORY: Family History  Problem Relation Age of Onset   Heart disease Mother    Heart disease Father    Heart disease Brother    Hypertension Brother    Diabetes Brother     ALLERGIES:  has No Known Allergies.  MEDICATIONS:  Current Outpatient Medications  Medication Sig Dispense Refill   Accu-Chek FastClix Lancets MISC Use to test sugars up to 4 times daily.  Dx Code: E11.319 102 each 12   Accu-Chek Softclix Lancets lancets Use as instructed 100 each 12   aspirin EC 81 MG tablet Take 1 tablet (81 mg total) by mouth daily. Swallow whole. 90 tablet 3   b complex vitamins capsule Take 1 capsule by mouth daily.     blood glucose meter kit and supplies KIT Dispense based on patient and insurance preference. Use up to four times daily as directed. Dx E11.319 1 each 0   Blood Glucose Monitoring Suppl (ACCU-CHEK GUIDE) w/Device KIT 1 each by Does not apply route 4 (four) times daily as needed. Use to test sugars up to 4 times daily.  Dx Code: E11.319 1 kit 0   Blood Pressure Monitoring (BLOOD PRESSURE CUFF) MISC 1 Units by Does not apply route daily. 1 each 0   cetirizine (ZYRTEC ALLERGY) 10 MG tablet Take 1 tablet (10 mg total) by mouth daily. 90 tablet 0   cholecalciferol (VITAMIN D3) 25 MCG (1000 UNIT) tablet Take 2,000 Units by mouth daily.     diclofenac Sodium (VOLTAREN) 1 % GEL Apply 2 g topically 4 (four) times daily. (Patient not taking: Reported on 01/12/2022) 150 g 3   Dulaglutide (TRULICITY) 0.75 MG/0.5ML SOPN INJECT 0.75 MG  SUBCUTANEOUSLY ONCE A WEEK 12 mL 0   empagliflozin (JARDIANCE) 25 MG TABS tablet Take 1 tablet (25 mg total) by mouth daily. 90 tablet 0   glucose blood (ACCU-CHEK GUIDE) test strip Use to test sugars up to 4 times daily.  Dx Code:  E11.319 200 each 12   hydrocortisone cream 0.5 % Apply 1 application. topically 2 (two) times daily. 30 g 0   insulin degludec (TRESIBA FLEXTOUCH) 100 UNIT/ML FlexTouch Pen INJECT 15 UNITS SUBCUTANEOUSLY ONCE DAILY 15 mL 3   Insulin Pen Needle (RELION PEN NEEDLES) 31G X 6 MM MISC USE AS DIRECTED TO  CHECK  BLOOD  SUGAR 100 each 3   iron polysaccharides (NIFEREX) 150 MG capsule Take 1 capsule (150 mg total) by mouth daily. 30 capsule 5   Lancet Devices (ACCU-CHEK SOFTCLIX) lancets Use as instructed up to 4 times daily.  Dx E11.319 400 each 3  Lancets Misc. (ACCU-CHEK FASTCLIX LANCET) KIT Use to test sugars up to 4 times daily.  Dx Code: E11.319 1 kit 0   LORazepam (ATIVAN) 0.5 MG tablet Take 1 tablet (0.5 mg total) by mouth at bedtime as needed for anxiety. (Patient not taking: Reported on 08/10/2021) 20 tablet 0   meloxicam (MOBIC) 15 MG tablet Take 1 tablet (15 mg total) by mouth daily. 30 tablet 2   metFORMIN (GLUCOPHAGE) 1000 MG tablet Take 1 tablet (1,000 mg total) by mouth 2 (two) times daily. 180 tablet 0   metoprolol succinate (TOPROL-XL) 25 MG 24 hr tablet Take 1 tablet by mouth once daily 90 tablet 0   nitroGLYCERIN (NITROSTAT) 0.4 MG SL tablet Place 1 tablet (0.4 mg total) under the tongue every 5 (five) minutes as needed for chest pain. 30 tablet 12   pantoprazole (PROTONIX) 40 MG tablet Take 1 tablet (40 mg total) by mouth daily before breakfast. (Patient not taking: Reported on 11/16/2021) 30 tablet 0   polyethylene glycol powder (GLYCOLAX/MIRALAX) 17 GM/SCOOP powder Take 17 g by mouth daily. 500 g 0   rosuvastatin (CRESTOR) 10 MG tablet Take 1 tablet (10 mg total) by mouth daily. 90 tablet 0   vitamin B-12 (CYANOCOBALAMIN) 500 MCG tablet Take 500 mcg by mouth daily.     No current facility-administered medications for this visit.    REVIEW OF SYSTEMS:    10 Point review of Systems was done is negative except as noted above.   PHYSICAL EXAMINATION: ECOG FS:1 - Symptomatic but  completely ambulatory .BP (!) 142/67 (BP Location: Left Arm, Patient Position: Sitting) Comment: Nurse was notify  Pulse 86   Temp (!) 97.5 F (36.4 C) (Temporal)   Resp 16   Wt 131 lb 4.8 oz (59.6 kg)   SpO2 100%   BMI 19.96 kg/m   GENERAL:alert, in no acute distress and comfortable SKIN: no acute rashes, no significant lesions EYES: conjunctiva are pink and non-injected, sclera anicteric OROPHARYNX: MMM, no exudates, no oropharyngeal erythema or ulceration NECK: supple, no JVD LYMPH:  no palpable lymphadenopathy in the cervical, axillary or inguinal regions LUNGS: clear to auscultation b/l with normal respiratory effort HEART: regular rate & rhythm ABDOMEN:  normoactive bowel sounds , non tender, not distended. Extremity: no pedal edema PSYCH: alert & oriented x 3 with fluent speech NEURO: no focal motor/sensory deficits   LABORATORY DATA:  I have reviewed the data as listed  .    Latest Ref Rng & Units 03/10/2023    8:21 AM 09/23/2022    8:30 AM 05/17/2022    8:15 AM  CBC  WBC 4.0 - 10.5 K/uL 13.6  13.4  11.1   Hemoglobin 13.0 - 17.0 g/dL 29.5  62.1  30.8   Hematocrit 39.0 - 52.0 % 37.4  40.3  37.5   Platelets 150 - 400 K/uL 256  244  236    .CBC    Component Value Date/Time   WBC 13.6 (H) 03/10/2023 0821   WBC 11.1 (H) 05/17/2022 0815   RBC 4.58 03/10/2023 0821   HGB 12.0 (L) 03/10/2023 0821   HGB 12.3 (L) 03/22/2018 0941   HCT 37.4 (L) 03/10/2023 0821   HCT 38.9 03/22/2018 0941   PLT 256 03/10/2023 0821   PLT 235 03/22/2018 0941   MCV 81.7 03/10/2023 0821   MCV 85 03/22/2018 0941   MCH 26.2 03/10/2023 0821   MCHC 32.1 03/10/2023 0821   RDW 15.0 03/10/2023 0821   RDW 13.5 03/22/2018 0941  LYMPHSABS 5.0 (H) 03/10/2023 0821   LYMPHSABS 6.3 (H) 03/22/2018 0941   MONOABS 1.4 (H) 03/10/2023 0821   EOSABS 0.4 03/10/2023 0821   EOSABS 0.2 03/22/2018 0941   BASOSABS 0.1 03/10/2023 0821   BASOSABS 0.0 03/22/2018 0941     .    Latest Ref Rng & Units  03/10/2023    8:21 AM 10/31/2022   11:39 AM 09/23/2022    8:30 AM  CMP  Glucose 70 - 99 mg/dL 027  253  664   BUN 8 - 23 mg/dL Creatinine 0.61 - 1.24 mg/dL 4.03  4.74  2.59   Sodium 135 - 145 mmol/L 137  139  137   Potassium 3.5 - 5.1 mmol/L 4.2  4.8  4.6   Chloride 98 - 111 mmol/L 102  101  101   CO2 22 - 32 mmol/L Calcium 8.9 - 10.3 mg/dL 9.3  9.3  9.7   Total Protein 6.5 - 8.1 g/dL 7.4  7.2  8.0   Total Bilirubin 0.3 - 1.2 mg/dL 0.3  0.2  0.4   Alkaline Phos 38 - 126 U/L 75  80  80   AST 15 - 41 U/L ALT 0 - 44 U/L . Lab Results  Component Value Date   LDH 129 03/10/2023    06/18/18 Right Cervical LN Needle/core Biopsy:    05/23/18 Fine Needle Aspiration Flow Cytometry:    RADIOGRAPHIC STUDIES: I have personally reviewed the radiological images as listed and agreed with the findings in the report. No results found.  ASSESSMENT & PLAN:   72 y.o. male with  1. 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 Benign behavior is not established. Bulky adenopathy such as this could relate to lymphoma, or metastatic squamous cell carcinoma. There is no visible extranodal spread of tumor.  -06/18/18 biopsy favored a low grade Marginal Zone Lymphoma -06/15/18 PET/CT revealed Enlarged and hypermetabolic lymph nodes involving the neck, axilla, subpectoral and mediastinal lymph nodes. No lymphadenopathy below the diaphragm. No findings for osseous lymphoma -06/07/18 ECHO for treatment planning revealed a normal ejection fraction -02/02/21 PET/CT showed unchanged mediastinal lymphadenopathy  with similar hypermetabolic activity. Interval development of moderate right and small left pleural effusion R > L.  -06/07/2021 Underwent thoracentesis with removal of 200 ml of straw colored fluid. Cytology revealed predominantly lymphocytic (95%) with 8-9% B-cells present. This is suggestive of involvement of pleural fluid by lymphoma.   6 patient's labs from today were discussed in detail with him  #2 generalized anxiety related to family stressors with the loss of his son about a year ago and significant trouble medical interventions including CABG and lymphoma treatment. -Stable  #3  Diabetes type 2 Continue follow-up with primary care physician for continued management of his diabetes  PLAN:  -Discussed lab results on 03/10/2023 with patient in detail. CBC showed WBC of 13.6K, hemoglobin of 12.0, and platelets of 256K. -lymphocytes slightly increasing -CMP stable  -LDH normal -Ferritin 36 and B12 labs 142 -DM generally poorly controlled, blood glucose  level 186 -Discussed recent CT scan ordered by PCP which revealed a slight increase in some lymph nodes, nothing symptomatic -recommended patient to consume adequate levels of food to support weight gain -recommended patient to keep feet well moisturized to address bilateral feet discomfort -continue to monitor with labs in 6 months  Follow-up RTC with Dr Candise Che with labs in 6 months  The total time spent in the appointment was 21 minutes* .  All of the patient's questions were answered with apparent satisfaction. The patient knows to call the clinic with any problems, questions or concerns.   Wyvonnia Lora MD MS AAHIVMS [facility] Hematology/Oncology Physician [facility]  .*Total Encounter Time as defined by the Centers for Medicare and Medicaid Services includes, in addition to the face-to-face time of a patient visit (documented in the note above) non-face-to-face time: obtaining and reviewing outside history,  ordering and reviewing medications, tests or procedures, care coordination (communications with other health care professionals or caregivers) and documentation in the medical record.    I,Mitra Faeizi,acting as a Neurosurgeon for Wyvonnia Lora, MD.,have documented all relevant documentation on the behalf of Wyvonnia Lora, MD,as directed by  Wyvonnia Lora, MD while in the presence of Wyvonnia Lora, MD.  .I have reviewed the above documentation for accuracy and completeness, and I agree with the above. Johney Maine MD

## 2023-03-15 ENCOUNTER — Encounter: Payer: Self-pay | Admitting: Occupational Therapy

## 2023-03-15 ENCOUNTER — Ambulatory Visit: Payer: 59 | Attending: Family Medicine | Admitting: Occupational Therapy

## 2023-03-15 DIAGNOSIS — R41842 Visuospatial deficit: Secondary | ICD-10-CM | POA: Diagnosis present

## 2023-03-15 NOTE — Therapy (Signed)
OUTPATIENT OCCUPATIONAL THERAPY LOW VISION EVALUATION  Patient Name: Alec York MRN: [id number] DOB:07/24/1951, 72 y.o., male Today's Date: 03/15/2023  PCP: Darral Dash, DO  REFERRING PROVIDER: Stephannie Li, MD   END OF SESSION:  OT End of Session - 03/15/23 1106     Visit Number 1    Number of Visits 7    Date for OT Re-Evaluation 05/12/23    Authorization Type [employer] Medicaid - no auth required (27 VL)    OT Start Time 1102    OT Stop Time 1221    OT Time Calculation (min) 79 min    Activity Tolerance Patient tolerated treatment well    Behavior During Therapy WFL for tasks assessed/performed            Past Medical History:  Diagnosis Date   Allergy    Coronary artery disease    Diabetes mellitus without complication    Diabetic retinopathy    Disabled 2/2 retinopathy   GERD (gastroesophageal reflux disease)    Hyperlipidemia    Hypertension    Past Surgical History:  Procedure Laterality Date   CARDIAC CATHETERIZATION     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   CABG x 4 [place]   Patient Active Problem List   Diagnosis Date Noted   Dyspnea 01/13/2023   Abdominal bloating 01/13/2023   Hypertrophic scar 03/10/2022   Health maintenance examination 12/07/2021   Low back pain 09/06/2021   Gait disturbance 09/06/2021   Generalized weakness 09/06/2021   Non-Hodgkin's lymphoma 06/24/2021   Pleural effusion on right 05/13/2021   Chronic heart failure with preserved ejection fraction 01/28/2021   Coronary artery disease involving native heart without angina pectoris 01/28/2021   CLL (chronic lymphocytic leukemia) 03/04/2020   Erectile dysfunction 01/10/2020   PAD (peripheral artery disease) 07/07/2017   Knee pain, chronic 06/15/2017   Type 2 diabetes mellitus with diabetic retinopathy 05/13/2015   Essential hypertension 05/13/2015    ONSET DATE: 02/08/2023 (date of referral); Pt has had visual problems for years  REFERRING DIAG: E11.319  (ICD-10-CM) - Diabetic retinopathy   THERAPY DIAG:  Visuospatial deficit  Rationale for Evaluation and Treatment: Rehabilitation  SUBJECTIVE:   SUBJECTIVE STATEMENT: Rewetting eye drops 3-4xweek. His wife uses his phone more than him. They have gone back and forth to [place]. Their grandchildren are going to be moving to [place] next week and they are excited to live closer to them. He got his phone in [place]. While the son he lives with does speak [place], his primary language is also [place]. He has no difficulty seeing the TV.   Pt accompanied by: significant other - [name]  PERTINENT HISTORY: " This is a 72 year old male who is being seen for a chief complaint of blurred vision involving the left eye and right eye.  Blurred vision is at all distances, central, and constant and associated with dry eyes.  The blurred vision is moderate in severity.  The blurred vision has been present for years.  [name] states he has noticed a decline in his visual acuity over the past few years.  Patient states he has to use a magnify glass to read.  Patient denies any flashes of lights or floaters in both eyes.  Patient denies any new cardiac episodes or strokes.  PAIN:  Are you having pain? No  FALLS: Has patient fallen in last 6 months? No  LIVING ENVIRONMENT: Lives with: lives with their spouse and lives with their son Lives in: House/apartment Stairs: Yes:  Internal: 13 steps; can reach both Has following equipment at home: Quad cane small base and Walker - 2 wheeled  PLOF: Independent; retired Development worker, community; driving  PATIENT GOALS: Get out of the house more, see things more clearly  OBJECTIVE:   GLASSES: reading - ST bifocals (reports still does not see well)  CURRENT MODIFICATIONS/ADAPTIONS: magnifier (did not bring)  DEVICES: none  PHONE: Android  HAND DOMINANCE: Right  ADLs: Overall ADLs: mod I  IADLs: Shopping: max to total A Community mobility:  dependent Medication management: dependent Financial management: dependent  MOBILITY STATUS: Needs Assist: to navigate environment safely due to visual impairments  FUNCTIONAL OUTCOME MEASURES: PSFS: 0.3  Total score = sum of the activity scores/number of activities Minimum detectable change (90%CI) for average score = 2 points Minimum detectable change (90%CI) for single activity score = 3 points   COGNITION: Overall cognitive status: Within functional limits for tasks assessed  VISUAL FINDINGS: Baseline vision: Wears glasses for reading only Visual history: cataracts and removed in [place] in 2012   OBSERVATIONS: Pt ambulates with HHA from spouse and use of narrow base quad cane. No LOB noted. Pt appears well-kept. Dons reading glasses for reading. Poor navigation of phone.   TODAY'S TREATMENT:                                                                                                                               Therapist adjusted contrast and text size on phone and discussed magnifier app on phone. Translation provided by [employer] [facility] employee for assistance downloading app on phone.   PATIENT EDUCATION: Education details: OT Role and POC; phone accessibility features Person educated: Patient and Spouse Education method: Explanation, handout, translated instructions  Education comprehension: verbalized understanding and needs further education  HOME EXERCISE PROGRAM: Not yet initiated   GOALS:  SHORT TERM GOALS: Target date: 04/12/2023    Patient will independently recall at least 2 compensatory strategies for visual impairment without cueing. Baseline: Goal status: INITIAL  2.  Patient will return demonstration of vision adaptive device use and verbalize understanding of how to obtain item(s) if desired.  Baseline:  Goal status: INITIAL   LONG TERM GOALS: Target date: 05/12/2023  Patient will report at least two-point increase in average PSFS score or at least  three-point increase in a single activity score indicating functionally significant improvement given minimum detectable change.  Baseline: 0.3 total score (See above for individual activity scores)  Goal status: INITIAL  ASSESSMENT:  CLINICAL IMPRESSION: Patient is a 72 y.o. y.o. male who was seen today for occupational therapy evaluation for assessment of vision related impairments to ADLs and IADLs. Hx includes DMII, HTN, Non-Hodgkin's lymphoma, PAD, CHF, and CAD with MI and CABGx2. Patient currently reports difficulty reading and locating items in his environment as a part of functional deficits and impairments as noted below. Pt to benefit from skilled OT services for education on visual impairment(s), compensatory and safety  strategies, and use of AD as needed for more independent and safe completion of daily occupations.   PERFORMANCE DEFICITS: in functional skills including ADLs, IADLs, mobility, decreased knowledge of use of DME, and vision.   IMPAIRMENTS: are limiting patient from ADLs, IADLs, leisure, and social participation.   CO-MORBIDITIES: may have co-morbidities  that affects occupational performance. Patient will benefit from skilled OT to address above impairments and improve overall function.  MODIFICATION OR ASSISTANCE TO COMPLETE EVALUATION: Min-Moderate modification of tasks or assist with assess necessary to complete an evaluation.  OT OCCUPATIONAL PROFILE AND HISTORY: Problem focused assessment: Including review of records relating to presenting problem.  CLINICAL DECISION MAKING: LOW - limited treatment options, no task modification necessary  REHAB POTENTIAL: Fair given chronicity of symptoms  EVALUATION COMPLEXITY: Low    PLAN:  OT FREQUENCY: 1x/week  OT DURATION: 6 weeks  PLANNED INTERVENTIONS: self care/ADL training, therapeutic exercise, therapeutic activity, functional mobility training, patient/family education, visual/perceptual  remediation/compensation, coping strategies training, DME and/or AE instructions, and Re-evaluation  RECOMMENDED OTHER SERVICES: None at this time   CONSULTED AND AGREED WITH PLAN OF CARE: Patient and spouse  PLAN FOR NEXT SESSION: Magnifiers; Go over low vision packet; magnifier app on phone?   Delana Meyer, OT 03/15/2023, 4:13 PM

## 2023-03-16 MED ORDER — B COMPLEX VITAMINS PO CAPS: 1.0000 | ORAL_CAPSULE | Freq: Every day | ORAL | 11 refills | Status: AC

## 2023-03-16 MED ORDER — POLYSACCHARIDE IRON COMPLEX 150 MG PO CAPS: 150.0000 mg | ORAL_CAPSULE | Freq: Every day | ORAL | 5 refills | Status: DC

## 2023-03-16 MED ORDER — B-12 1000 MCG SL SUBL: 1000.0000 ug | SUBLINGUAL_TABLET | Freq: Every day | SUBLINGUAL | 11 refills | Status: AC

## 2023-03-20 ENCOUNTER — Ambulatory Visit (HOSPITAL_COMMUNITY): Payer: 59

## 2023-04-11 ENCOUNTER — Other Ambulatory Visit: Payer: Self-pay | Admitting: Student

## 2023-04-24 ENCOUNTER — Ambulatory Visit: Payer: 59 | Admitting: Podiatry

## 2023-05-11 ENCOUNTER — Other Ambulatory Visit: Payer: Self-pay | Admitting: Student

## 2023-05-11 DIAGNOSIS — E11311 Type 2 diabetes mellitus with unspecified diabetic retinopathy with macular edema: Secondary | ICD-10-CM

## 2023-05-11 DIAGNOSIS — I2510 Atherosclerotic heart disease of native coronary artery without angina pectoris: Secondary | ICD-10-CM

## 2023-05-22 ENCOUNTER — Telehealth: Payer: Self-pay

## 2023-05-22 ENCOUNTER — Ambulatory Visit (HOSPITAL_COMMUNITY)
Admission: RE | Admit: 2023-05-22 | Discharge: 2023-05-22 | Disposition: A | Discharge status: Home / Self Care | Payer: 59 | Source: Ambulatory Visit | Attending: Family Medicine | Admitting: Family Medicine

## 2023-05-22 ENCOUNTER — Ambulatory Visit (INDEPENDENT_AMBULATORY_CARE_PROVIDER_SITE_OTHER): Payer: 59 | Admitting: Family Medicine

## 2023-05-22 ENCOUNTER — Encounter: Payer: Self-pay | Admitting: Family Medicine

## 2023-05-22 VITALS — BP 110/62 | HR 78 | Wt 130.0 lb

## 2023-05-22 DIAGNOSIS — M7989 Other specified soft tissue disorders: Secondary | ICD-10-CM

## 2023-05-22 DIAGNOSIS — R600 Localized edema: Secondary | ICD-10-CM

## 2023-05-22 DIAGNOSIS — R29898 Other symptoms and signs involving the musculoskeletal system: Secondary | ICD-10-CM

## 2023-05-22 DIAGNOSIS — C8590 Non-Hodgkin lymphoma, unspecified, unspecified site: Secondary | ICD-10-CM | POA: Diagnosis not present

## 2023-05-22 DIAGNOSIS — E119 Type 2 diabetes mellitus without complications: Secondary | ICD-10-CM | POA: Diagnosis not present

## 2023-05-22 DIAGNOSIS — Z794 Long term (current) use of insulin: Secondary | ICD-10-CM

## 2023-05-22 DIAGNOSIS — E11311 Type 2 diabetes mellitus with unspecified diabetic retinopathy with macular edema: Secondary | ICD-10-CM

## 2023-05-22 DIAGNOSIS — R351 Nocturia: Secondary | ICD-10-CM

## 2023-05-22 DIAGNOSIS — M545 Low back pain, unspecified: Secondary | ICD-10-CM

## 2023-05-22 DIAGNOSIS — R5381 Other malaise: Secondary | ICD-10-CM

## 2023-05-22 LAB — POCT GLYCOSYLATED HEMOGLOBIN (HGB A1C): HbA1c, POC (controlled diabetic range): 8.5 % — AB (ref 0.0–7.0)

## 2023-05-22 MED ORDER — TRULICITY 1.5 MG/0.5ML ~~LOC~~ SOAJ
1.5000 mg | SUBCUTANEOUS | 3 refills | Status: DC
Start: 2023-05-22 — End: 2023-11-02

## 2023-05-22 NOTE — Assessment & Plan Note (Addendum)
Patient has significant deconditioning. - Physical therapy referral placed

## 2023-05-22 NOTE — Assessment & Plan Note (Signed)
Stable, follows with heme/onc. - CBC w/ diff - Uric acid

## 2023-05-22 NOTE — Progress Notes (Signed)
VASCULAR LAB    Right lower extremity venous duplex has been performed.  See CV proc for preliminary results.  Called report to [facility], Mohammed Mcandrew, RVT 05/22/2023, 2:15 PM

## 2023-05-22 NOTE — Assessment & Plan Note (Addendum)
Concern for DVT in setting of NHL and unilateral pitting edema. Wells score of 3, high risk. Patient was considering a trip to [place], but will delay pending the results of today's imaging and testing. - RLE DVT US ordered ASAP, f/u results closely

## 2023-05-22 NOTE — Assessment & Plan Note (Addendum)
No evidence of incontinence. Suspect this problem is osmotic diuresis secondary to T2DM. - Optimize diabetes control, consider medication if persistent - Urine microalbumin/creatinine ratio

## 2023-05-22 NOTE — Assessment & Plan Note (Addendum)
A1c up to 8.5 from 7.8. Instructed patient to reduce intake of sugary foods, eliminate brown sugar. Will not change insulin at this time given home AM CBG readings in 130s. Suspect patient's frequent urination is related to diabetes progression. - Increase Trulicity from 0.75 mg to 1.5 mg weekly, already max metformin and Jardiance - Urine microalbumin/creatinine ratio - BMP

## 2023-05-22 NOTE — Progress Notes (Signed)
SUBJECTIVE:   [place] video interpreter used for visit.  CHIEF COMPLAINT / HPI:  Alec York is a 72 y.o. male with a past medical history of T2DM, hypertension, CAD, myocardial infarction, non-Hodgkin's lymphoma, presenting to the clinic for diabetes follow-up and with several acute concerns noted below.  Diabetes The patient checks AM blood glucose before insulin: Average ~138 Medications: Metformin 1000 mg twice daily, Jardiance 25 mg daily, Trulicity 0.75 mg weekly. Denies missing missing any medication doses. Last A1c 7.8, taken 6 months ago. Has been having difficulty exercising due to generalized deconditioning and weakness. Notes that he has been eating a lot of sugary snacks.  Did believe that brown sugar was artificial and could be eaten.  Urinary frequency Patient has concerned because he has urinating "every 15-20 minutes" at night.  He believes that when he misses his insulin dose, the urination is worse.  This problems been going on for approximately 1 year and has been getting worse.  He denies urinary incontinence although does notes rare urinary leakage.  Denies burning, itching, pain with urination.  L arm numbness The patient has had left arm numbness for the past 1 month.  He describes it as loss of feeling and weakness that he notices most when trying to hold objects.  States that he is unable to hold a cup with his left hand but he is able to do so with his right.  Denies any recent injury or falls.  Denies dizziness, changes in vision, weakness in other extremities.  Does note generalized tiredness and weakness.  RLE swelling Patient reports he has had 6 months of swelling in his right lower extremity.  The swelling has progressively gotten worse over that time period.  He has not noticed any change in his left leg.  He does note that he had a heart attack in [place] approximately 2 years ago.  Does have some shortness of breath but it has been constant since  his heart attack and is unchanged.  Denies chest pain.  Back pain, weakness Patient notes that he has been having chronic back pain and left shoulder pain and requests a PT referral.  PERTINENT  PMH / PSH: T2DM, hypertension, CAD, PAD, myocardial infarction, non-Hodgkin's lymphoma   OBJECTIVE:   BP 110/62   Pulse 78   Wt 130 lb (59 kg)   SpO2 98%   BMI 19.77 kg/m   General: Thin elderly male resting comfortably in chair, NAD, alert and at baseline. HEENT:  Head: Normocephalic, atraumatic. Eyes: PERRLA. No conjunctival erythema or scleral injections. Mouth/Oral: Clear, MMM. Neck: Right-sided significant lymphadenopathy, known NHL site. Cardiovascular: Regular rate and rhythm. Normal S1/S2. No murmurs, rubs, or gallops appreciated. 2+ radial pulses bilaterally. Pulmonary: Clear bilaterally to ascultation. No increased WOB, no accessory muscle usage. No wheezes, crackles, or rhonchi. Skin: Warm and dry. Extremities: 2+ pitting edema up to knee on RLE, no edema on LLE. Venous stasis skin changes over bilateral shins. MSK: Thin, low muscle mass. Strength equal bilaterally but 2/5 in all muscle groups.  Neurological Examination: MS: Awake, alert, interactive. Normal eye contact, answered the questions appropriately, speech was fluent, normal comprehension.  Attention and concentration were normal. Cranial Nerves: Pupils were equal and reactive to light; EOM normal, no nystagmus; no ptsosis, intact facial sensation, face symmetric with full strength of facial muscles, palate elevation is symmetric, tongue protrusion is symmetric with full movement to both sides.  Sternocleidomastoid and trapezius are with normal strength. Strength: Symmetrical 2/5 strength in  all muscle groups bilaterally. Sensation: Intact to light touch. Romberg negative. Coordination: No dysmetria noted. Gait: Tandem gait was normal. No difficulty with balance.  Results for orders placed or performed in visit on  05/22/23 (from the past 48 hour(s))  Result Value Ref Range   HbA1c, POC (controlled diabetic range) 8.5 (A) 0.0 - 7.0 %    ASSESSMENT/PLAN:   Low back pain Patient has significant deconditioning. 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 Instructed patient to reduce intake of sugary foods, eliminate brown sugar. Will not change insulin at this time given home AM CBG readings in 130s. Suspect patient's frequent urination is related to diabetes progression. - Increase Trulicity from 0.75 mg to 1.5 mg weekly, already max metformin and Jardiance - Urine microalbumin/creatinine ratio - BMP  Frequent urination at night No evidence of incontinence. Suspect this problem is osmotic diuresis secondary to T2DM. 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 - CBC w/ diff - Uric acid  Lower extremity edema Concern for DVT in setting of NHL and unilateral pitting edema. Wells score of 3, high risk. Patient was considering a trip to [place], but will delay pending the results of today's imaging and testing. - RLE DVT US ordered ASAP, f/u results closely  Left arm weakness Physical exam is reassuring, but concern for recent stroke remains based on symptoms and history. Possibly also due to nerve impingement or cervical spine involvement in setting of NHL, but will follow closely at next appointment. - Urgent MRI w/o contrast ordered, f/u results closely - f/u in 1 week  Follow-up 05/29/23.  Trevino Wyatt Sharion Dove, MD [facility]

## 2023-05-22 NOTE — Telephone Encounter (Signed)
Called patient using [employer] ID # [id number].  Informed patient of appointment for:   MR Brain w/o contrast [facility] [place] (entrance A) Tuesday 05/30/2023 1700 hrs with arrival at 1630  .Glennie Hawk, CMA

## 2023-05-22 NOTE — Telephone Encounter (Signed)
Vascular Lab calls nurse line to get call report.   Negative for DVT.   Will forward to ordering provider.

## 2023-05-22 NOTE — Assessment & Plan Note (Addendum)
Physical exam is reassuring, but concern for recent stroke remains based on symptoms and history. Possibly also due to nerve impingement or cervical spine involvement in setting of NHL, but will follow closely at next appointment. - Urgent MRI w/o contrast ordered, f/u results closely - f/u in 1 week

## 2023-05-22 NOTE — Patient Instructions (Addendum)
It was great to see you today! Thank you for choosing [facility] for your primary care.  Today we addressed: Diabetes: You are the maximum dose of metformin and Jardiance, but we will increase your Trulicity to 1.5 mg.  2. Frequent peeing: We suspect this has to do with your diabetes and getting your blood sugars under better control will help. I recommend not drinking fluids right before bed as well.  3. Arm weakness: We will get an MRI of your brain, just in case of a stroke. It may also have to do with your nerves. We will follow up in 1-2 weeks.  4. Right leg swelling: We are going to make sure you do not have a clot in your leg and we will get an ultrasound of your leg to make sure. You will get a call about this.  We placed a referral to physical therapy as well and you will get a call from them.  We are checking some labs today, including your hemoglobin and other blood values, your uric acid, a metabolic panel.  You should return to our clinic in 1-2 weeks.  Thank you for coming to see [place] at [facility] and for the opportunity to care for you! Jeanene Mena, MD 05/22/2023, 10:20 AM  ____________________________________________________  Please arrive at least 15 minutes prior to your scheduled appointments.  If you haven't already, please set up MyChart to have easy access to your labs results, and communication with your primary care physician.  If you had blood work today, you will get a MyChart message or a letter if results are normal. Otherwise, you will get a call from [place].  If you had a referral placed, they will call you to set up an appointment. Please give [place] a call if you don't hear back in the next 2 weeks.  If you need additional refills before your next appointment, please call your pharmacy first..5

## 2023-05-23 ENCOUNTER — Telehealth: Payer: Self-pay | Admitting: Family Medicine

## 2023-05-23 LAB — CBC WITH DIFFERENTIAL/PLATELET
Basophils Absolute: 0.1 10*3/uL (ref 0.0–0.2)
Basos: 1 %
EOS (ABSOLUTE): 0.3 10*3/uL (ref 0.0–0.4)
Eos: 2 %
Hematocrit: 37.7 % (ref 37.5–51.0)
Hemoglobin: 11.8 g/dL — ABNORMAL LOW (ref 13.0–17.7)
Immature Grans (Abs): 0 10*3/uL (ref 0.0–0.1)
Immature Granulocytes: 0 %
Lymphocytes Absolute: 5.4 10*3/uL — ABNORMAL HIGH (ref 0.7–3.1)
Lymphs: 38 %
MCH: 25.5 pg — ABNORMAL LOW (ref 26.6–33.0)
MCHC: 31.3 g/dL — ABNORMAL LOW (ref 31.5–35.7)
MCV: 81 fL (ref 79–97)
Monocytes Absolute: 1 10*3/uL — ABNORMAL HIGH (ref 0.1–0.9)
Monocytes: 7 %
Neutrophils Absolute: 7.6 10*3/uL — ABNORMAL HIGH (ref 1.4–7.0)
Neutrophils: 52 %
Platelets: 295 10*3/uL (ref 150–450)
RBC: 4.63 x10E6/uL (ref 4.14–5.80)
RDW: 13.6 % (ref 11.6–15.4)
WBC: 14.4 10*3/uL — ABNORMAL HIGH (ref 3.4–10.8)

## 2023-05-23 LAB — MICROALBUMIN / CREATININE URINE RATIO
Creatinine, Urine: 28.8 mg/dL
Microalb/Creat Ratio: 174 mg/g creat — ABNORMAL HIGH (ref 0–29)
Microalbumin, Urine: 50.1 ug/mL

## 2023-05-23 LAB — BASIC METABOLIC PANEL
BUN/Creatinine Ratio: 14 (ref 10–24)
BUN: 13 mg/dL (ref 8–27)
CO2: 24 mmol/L (ref 20–29)
Calcium: 9.6 mg/dL (ref 8.6–10.2)
Chloride: 100 mmol/L (ref 96–106)
Creatinine, Ser: 0.9 mg/dL (ref 0.76–1.27)
Glucose: 127 mg/dL — ABNORMAL HIGH (ref 70–99)
Potassium: 4.6 mmol/L (ref 3.5–5.2)
Sodium: 138 mmol/L (ref 134–144)
eGFR: 92 mL/min/{1.73_m2} (ref >59)

## 2023-05-23 LAB — URIC ACID: Uric Acid: 5.9 mg/dL (ref 3.8–8.4)

## 2023-05-23 NOTE — Telephone Encounter (Signed)
Encounter created in error

## 2023-05-26 ENCOUNTER — Telehealth: Payer: Self-pay

## 2023-05-26 NOTE — Telephone Encounter (Signed)
Patient calls nurse line requesting lab results. ? ?Will forward to provider who saw patient.  ?

## 2023-05-26 NOTE — Telephone Encounter (Signed)
Patient left multiple messages regarding speaking with provider regarding recent lab work.   He has concerns about his kidney function and would like to speak with a provider as soon as possible.   Please return call to patient at [phone].  Veronda Prude, RN

## 2023-05-27 ENCOUNTER — Telehealth: Payer: Self-pay | Admitting: Family Medicine

## 2023-05-27 NOTE — Telephone Encounter (Signed)
Encounter created in error

## 2023-05-29 ENCOUNTER — Ambulatory Visit (INDEPENDENT_AMBULATORY_CARE_PROVIDER_SITE_OTHER): Payer: 59 | Admitting: Family Medicine

## 2023-05-29 ENCOUNTER — Encounter: Payer: Self-pay | Admitting: Family Medicine

## 2023-05-29 VITALS — BP 114/56 | HR 76 | Ht 68.0 in | Wt 132.1 lb

## 2023-05-29 DIAGNOSIS — H9190 Unspecified hearing loss, unspecified ear: Secondary | ICD-10-CM | POA: Insufficient documentation

## 2023-05-29 DIAGNOSIS — I252 Old myocardial infarction: Secondary | ICD-10-CM

## 2023-05-29 DIAGNOSIS — C8591 Non-Hodgkin lymphoma, unspecified, lymph nodes of head, face, and neck: Secondary | ICD-10-CM

## 2023-05-29 DIAGNOSIS — H9193 Unspecified hearing loss, bilateral: Secondary | ICD-10-CM | POA: Diagnosis not present

## 2023-05-29 DIAGNOSIS — I2510 Atherosclerotic heart disease of native coronary artery without angina pectoris: Secondary | ICD-10-CM | POA: Diagnosis not present

## 2023-05-29 DIAGNOSIS — R350 Frequency of micturition: Secondary | ICD-10-CM | POA: Diagnosis not present

## 2023-05-29 DIAGNOSIS — Z794 Long term (current) use of insulin: Secondary | ICD-10-CM

## 2023-05-29 DIAGNOSIS — E11311 Type 2 diabetes mellitus with unspecified diabetic retinopathy with macular edema: Secondary | ICD-10-CM

## 2023-05-29 LAB — POCT URINALYSIS DIP (MANUAL ENTRY)
Bilirubin, UA: NEGATIVE
Glucose, UA: >=1000 mg/dL — AB
Ketones, POC UA: NEGATIVE mg/dL
Nitrite, UA: NEGATIVE
Protein Ur, POC: NEGATIVE mg/dL
Spec Grav, UA: 1.01 (ref 1.010–1.025)
Urobilinogen, UA: 0.2 E.U./dL
pH, UA: 6 (ref 5.0–8.0)

## 2023-05-29 MED ORDER — MIRABEGRON ER 25 MG PO TB24
25.0000 mg | ORAL_TABLET | Freq: Every day | ORAL | Status: DC
Start: 2023-05-29 — End: 2024-04-26

## 2023-05-29 MED ORDER — METOPROLOL SUCCINATE ER 25 MG PO TB24: 25.0000 mg | ORAL_TABLET | Freq: Every day | ORAL | 0 refills | Status: DC

## 2023-05-29 MED ORDER — NITROGLYCERIN 0.4 MG SL SUBL
0.4000 mg | SUBLINGUAL_TABLET | SUBLINGUAL | 12 refills | Status: DC | PRN
Start: 2023-05-29 — End: 2023-12-08

## 2023-05-29 NOTE — Assessment & Plan Note (Signed)
-   Ambulatory referral to audiology

## 2023-05-29 NOTE — Assessment & Plan Note (Signed)
>>  ASSESSMENT AND PLAN FOR URINARY FREQUENCY WRITTEN ON 05/29/2023  6:13 PM BY Denicia Pagliarulo, MD  Suspect contribution of osmotic diuresis as well as pelvic floor deconditioning.  No concern for obstructive uropathy, no incontinence. - Mirabegron 25 mg daily - Can consider adding antimuscarinic if monotherapy ineffective

## 2023-05-29 NOTE — Assessment & Plan Note (Signed)
Patient has high glucose on POCT urinalysis, concerning for osmotic diuresis in setting of SGLT-2 use.  Last week, reported elevated home CBGs and had A1c of 8.2.  Will call patient to counsel on glucose management.

## 2023-05-29 NOTE — Progress Notes (Signed)
   SUBJECTIVE:   Customer service manager interpreter used.  CHIEF COMPLAINT / HPI:  Alec York is a 72 y.o. male with a past medical history of T2DM, hypertension, CAD, PAD, myocardial infarction, and non-Hodgkin's lymphoma presenting to the clinic for chronic disease follow up.  Urinary frequency Patient is still urinating often and it is interfering with his sleep to the point that he feels tired during the day. Not drinking water before bed. Denies urinary incontinence and difficulty urinating. Endorses strong urge.  Hearing loss Progressive hearing loss over the past few years. Patient requests referral to audiology.  PERTINENT PMH / PSH: T2DM, hypertension, CAD, PAD, myocardial infarction, non-Hodgkin's lymphoma   OBJECTIVE:   BP (!) 114/56   Pulse 76   Ht 5\' 8"  (1.727 m)   Wt 132 lb 2 oz (59.9 kg)   SpO2 99%   BMI 20.09 kg/m   General: Age-appropriate, resting comfortably in chair, NAD, WNWD, alert and at baseline. HEENT:  Head: Normocephalic, atraumatic. Ears: TMs non-bulging and non-erythematous bilaterally. No erythema of external ear canal. No cerumen impaction. Cardiovascular: Regular rate and rhythm. Normal S1/S2. No murmurs, rubs, or gallops appreciated. 2+ radial pulses. Pulmonary: Clear bilaterally to ascultation. No increased WOB, no accessory muscle usage. No wheezes, crackles, or rhonchi. Abdominal: Normoactive bowel sounds. No tenderness to deep or light palpation. No rebound or guarding. No HSM. Skin: Warm and dry. Extremities: No peripheral edema bilaterally.  POCT Labs Results for orders placed or performed in visit on 05/29/23 (from the past 48 hour(s))  POCT urinalysis dipstick     Status: Abnormal   Collection Time: 05/29/23  3:15 PM  Result Value Ref Range   Color, UA yellow yellow   Clarity, UA clear clear   Glucose, UA >=1,000 (A) negative mg/dL   Bilirubin, UA negative negative   Ketones, POC UA negative negative mg/dL   Spec Grav, UA  1.610 1.010 - 1.025   Blood, UA trace-intact (A) negative   pH, UA 6.0 5.0 - 8.0   Protein Ur, POC negative negative mg/dL   Urobilinogen, UA 0.2 0.2 or 1.0 E.U./dL   Nitrite, UA Negative Negative   Leukocytes, UA Small (1+) (A) Negative    ASSESSMENT/PLAN:   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  Last week, reported elevated home CBGs and had A1c of 8.2.  Will call patient to counsel on glucose management.  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  Staff message sent to Dr. Candise Che, patient's oncologist for advice, awaiting response.  Patient said he will call oncology office himself as well.  Coronary artery disease involving native heart without angina pectoris Patient on statin, ASA, but not ACEI or B-blocker. - Resumed metoprolol 25 mg daily XR that patient had discontinued himself - Consider ACEI next visit - Nitroglycerin refilled  Hearing loss - Ambulatory referral to audiology  Urinary frequency Suspect contribution of osmotic diuresis as well as pelvic floor deconditioning.  No concern for obstructive uropathy, no incontinence. - Mirabegron 25 mg daily - Can consider adding antimuscarinic if monotherapy ineffective  Return in about 6 months (around 11/29/2023).  Shonique Pelphrey Sharion Dove, MD [facility]

## 2023-05-29 NOTE — Assessment & Plan Note (Signed)
Patient on statin, ASA, but not ACEI or B-blocker. - Resumed metoprolol 25 mg daily XR that patient had discontinued himself - Consider ACEI next visit 

## 2023-05-29 NOTE — Assessment & Plan Note (Signed)
Upward trend in leukocytes and differential noted last visit.  Staff message sent to Dr. Candise Che, patient's oncologist for advice, awaiting response.  Patient said he will call oncology office himself as well.

## 2023-05-29 NOTE — Assessment & Plan Note (Addendum)
>>  ASSESSMENT AND PLAN FOR CORONARY ARTERY DISEASE INVOLVING NATIVE HEART WITHOUT ANGINA PECTORIS WRITTEN ON 05/29/2023  6:10 PM BY Nimra Puccinelli, MD  Patient on statin, ASA, but not ACEI or B-blocker. - Resumed metoprolol 25 mg daily XR that patient had discontinued himself - Consider ACEI next visit - Nitroglycerin refilled

## 2023-05-29 NOTE — Assessment & Plan Note (Deleted)
Patient on statin, ASA, but not ACEI or B-blocker. - Resumed metoprolol 25 mg daily XR that patient had discontinued himself - Consider ACEI next visit

## 2023-05-29 NOTE — Patient Instructions (Signed)
It was great to see you today! Thank you for choosing [facility] for your primary care.  Today we addressed: I am sending in your mirabegron to take daily to keep you from having to pee as often at night. I am refilling your nitroglycerin to take if you have chest pain and I am refilling your metoprolol to take daily for your heart. You will get a call from your audiologist about hearing testing. You can book your trip to [place], but please make sure there is a number we can use to reach you.  You should return to our clinic in 6 months or sooner if you have any problems with the medications we talked about.  Thank you for coming to see [place] at [facility] and for the opportunity to care for you! Alberta Lenhard, MD 05/29/2023, 3:03 PM  ____________________________________________________  Make sure to check out at the front desk before you leave today.  Please arrive at least 15 minutes prior to your scheduled appointments.  If you haven't already, please set up MyChart to have easy access to your labs results, and communication with your primary care physician.  If you had blood work today, you will get a MyChart message or a letter if results are normal. Otherwise, you will get a call from [place].  If you had a referral placed, they will call you to set up an appointment. Please give [place] a call if you don't hear back in the next 2 weeks.  If you need additional refills before your next appointment, please call your pharmacy first.

## 2023-05-29 NOTE — Assessment & Plan Note (Signed)
Suspect contribution of osmotic diuresis as well as pelvic floor deconditioning.  No concern for obstructive uropathy, no incontinence. - Mirabegron 25 mg daily - Can consider adding antimuscarinic if monotherapy ineffective

## 2023-05-30 ENCOUNTER — Ambulatory Visit (HOSPITAL_COMMUNITY)
Admission: RE | Admit: 2023-05-30 | Discharge: 2023-05-30 | Disposition: A | Discharge status: Home / Self Care | Payer: 59 | Source: Ambulatory Visit | Attending: Family Medicine | Admitting: Family Medicine

## 2023-05-30 DIAGNOSIS — R29898 Other symptoms and signs involving the musculoskeletal system: Secondary | ICD-10-CM | POA: Diagnosis not present

## 2023-05-31 ENCOUNTER — Telehealth: Payer: Self-pay

## 2023-05-31 NOTE — Telephone Encounter (Signed)
Radiology calls nurse line to give call report.   Advised we have received the MRI results.   He reports impression 5 to be noted by provider.   Will forward to ordering provider.

## 2023-06-07 ENCOUNTER — Telehealth: Payer: Self-pay | Admitting: Family Medicine

## 2023-06-07 NOTE — Telephone Encounter (Signed)
Spoke with patient to notify him that MRI brain showed right-sided lymph node swelling and that I recommend follow-up with Dr. Candise Che, patient's heme-onc physician.  I reached out to Dr. Candise Che via Epic staff message and called his office.  Patient also has lymphocytosis and leukocytosis on CBC w/ diff from 2 weeks ago and lymph node enlargement on LE DVT US.  Patient will call Dr. Clyda Greener office to schedule an appointment and discuss his upcoming [place] trip.

## 2023-06-08 ENCOUNTER — Ambulatory Visit: Payer: 59 | Admitting: Student

## 2023-06-12 ENCOUNTER — Ambulatory Visit: Payer: 59 | Attending: Family Medicine | Admitting: Audiologist

## 2023-06-12 DIAGNOSIS — H903 Sensorineural hearing loss, bilateral: Secondary | ICD-10-CM | POA: Insufficient documentation

## 2023-06-12 NOTE — Procedures (Signed)
  [facility] [address] [place], [place]  [address] [phone]  AUDIOLOGICAL  EVALUATION  NAME: Alec York     DOB:   July 15, 1951      [id number]                                                                                     DATE: 06/12/2023     REFERENT: Darral Dash, DO STATUS: Outpatient DIAGNOSIS: Bilateral Sensorineural Hearing Loss    History: Alec York was seen for an audiological evaluation. Alec York was accompanied to the appointment by his wife. Interpreting was provided via video in [place]. [name] is receiving a hearing evaluation due to concerns for decreased hearing. [name] has difficulty hearing in both ears. This difficulty began gradually over the last two years.  No pain or pressure reported in either ear. No Tinnitus reported. [name] denied any chemo or radiation, or other ototoxic medications.  Medical history positive for diabetes which is a risk factor for hearing loss. No other relevant case history reported.   Evaluation:  Otoscopy showed a clear view of the tympanic membranes, bilaterally Tympanometry results were consistent with normal middle ear function, bilaterally   Audiometric testing was completed using conventional audiometry with supraural transducer. Speech testing not available in native language. Pure tone thresholds show mild sloping to severe sensorineural  hearing loss in the right ear and mild sloping to severe sensorineural  hearing loss in the left ear.   Results:  The test results were reviewed with Alec York and his wife. [name] needs hearing aids. He was given a handout of his audiogram and a handout [employer] He will need to call them to figure out his hearing benefit and submit today's test results.   Recommendations: Amplification is necessary for both ears. Please contact your insurance provider for more information on hearing aid benefits  and where to purchase haring aids.    30 minutes spent testing and counseling on results.   Ammie Ferrier  Audiologist, Au.D., CCC-A 06/12/2023  2:57 PM  Aleila Syverson Tera Partridge, MS Audiology Student    Cc: Darral Dash, DO

## 2023-07-03 ENCOUNTER — Ambulatory Visit: Payer: 59 | Attending: Family Medicine

## 2023-07-13 ENCOUNTER — Other Ambulatory Visit: Payer: Self-pay

## 2023-07-13 DIAGNOSIS — E11311 Type 2 diabetes mellitus with unspecified diabetic retinopathy with macular edema: Secondary | ICD-10-CM

## 2023-07-13 MED ORDER — EMPAGLIFLOZIN 25 MG PO TABS: 25.0000 mg | ORAL_TABLET | Freq: Every day | ORAL | 0 refills | Status: DC

## 2023-07-20 NOTE — Therapy (Signed)
OUTPATIENT PHYSICAL THERAPY NEURO EVALUATION   Patient Name: Alec York MRN: [id number] DOB:01-02-1951, 72 y.o., male Today's Date: 07/25/2023   PCP: Darral Dash REFERRING PROVIDER: Burley Saver  END OF SESSION:  PT End of Session - 07/25/23 1217     Visit Number 1    Date for PT Re-Evaluation 10/17/23    Authorization Type [employer]    PT Start Time 1225    PT Stop Time 1310    PT Time Calculation (min) 45 min             Past Medical History:  Diagnosis Date   Allergy    Coronary artery disease    Diabetes mellitus without complication (HCC)    Diabetic retinopathy (HCC)    Disabled 2/2 retinopathy   GERD (gastroesophageal reflux disease)    Hyperlipidemia    Hypertension    Past Surgical History:  Procedure Laterality Date   CARDIAC CATHETERIZATION     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   CABG x 4 [place]   Patient Active Problem List   Diagnosis Date Noted   Hearing loss 05/29/2023   Left arm weakness 05/22/2023   Frequent urination at night 05/22/2023   Lower extremity edema 05/22/2023   Dyspnea 01/13/2023   Abdominal bloating 01/13/2023   Hypertrophic scar 03/10/2022   Health maintenance examination 12/07/2021   Low back pain 09/06/2021   Gait disturbance 09/06/2021   Generalized weakness 09/06/2021   Pleural effusion on right 05/13/2021   Chronic heart failure with preserved ejection fraction (HCC) 01/28/2021   History of MI (myocardial infarction) 01/28/2021   CLL (chronic lymphocytic leukemia) (HCC) 03/04/2020   Erectile dysfunction 01/10/2020   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) 05/31/2018   Lymphadenopathy 04/12/2018   PAD (peripheral artery disease) (HCC) 07/07/2017   Knee pain, chronic 06/15/2017   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 I have leg pain too but the back pain is too much. Only taking Tylenol every day. I have a lot of  weakness in my body.   Pt accompanied by: significant other  PERTINENT HISTORY: Patient is a 72 y.o. male here for bilateral knee pain, left arm, low back pain. Patient reports he has lost about 28 pounds over the past year related to his treatment for non-hodgkins lymphoma. Over the past several months he has had several aches and pains. Worst of which is in bilateral anterior knees. Worse with walking, standing for prolonged periods. Using a cane to help get around. No acute injury though balance has been an issue, feels he's going to fall. Also with left anterior upper arm pain. No swelling or bruising and no acute injury. Feels more with carrying items and feels weaker. Left sided low back pain without radiation into his legs. No bowel/bladder dysfunction. No numbness/tingling.  PAIN:  Are you having pain? Yes: NPRS scale: 10/10 Pain location: low back in the middle Pain description: dull, ache, constant  Aggravating factors: walking, sitting, everything hurts  Relieving factors: Tylenol helps a little bit   PRECAUTIONS: None  RED FLAGS: None   WEIGHT BEARING RESTRICTIONS: No  FALLS: Has patient fallen in last 6 months? No  LIVING ENVIRONMENT: Lives with: lives with their family Lives in: House/apartment Stairs: Yes: Internal: 14 steps; on right going up Has following equipment at home: Single point cane and Walker - 2 wheeled  PLOF: Independent with basic ADLs  and Independent with household mobility without device  PATIENT GOALS: to have less pain   OBJECTIVE:   COGNITION: Overall cognitive status: Within functional limits for tasks assessed   SENSATION: WFL  MUSCLE LENGTH: Hamstrings: BLE tightness   POSTURE: rounded shoulders, forward head, and increased thoracic kyphosis  LUMBAR ROM:  limited 50% in all directions with pain    LOWER EXTREMITY ROM:   grossly WFL    LOWER EXTREMITY MMT:  grossly 3+/5 BLE    TRANSFERS: Assistive device utilized: Counselling psychologist  Sit to stand: Modified independence Stand to sit: Modified independence Chair to chair: Modified independence  GAIT: Gait pattern: step to pattern, decreased arm swing- Right, decreased arm swing- Left, decreased step length- Right, decreased step length- Left, decreased stance time- Right, decreased stance time- Left, decreased stride length, narrow BOS, poor foot clearance- Right, and poor foot clearance- Left Distance walked: in clinic distances Assistive device utilized: Single point cane Level of assistance: Modified independence Comments: slowed speed, unsteady and higher risk of fall  FUNCTIONAL TESTS:  5 times sit to stand: 27.12s  Timed up and go (TUG): 26s   TODAY'S TREATMENT:                                                                                                                              DATE: EVAL 07/25/23    PATIENT EDUCATION: Education details: POC and HEP Person educated: Patient Education method: Explanation Education comprehension: verbalized understanding  HOME EXERCISE PROGRAM: Access Code: [email or web address] URL: [email or web address] Date: 07/25/2023 Prepared by: Cassie Freer  Exercises - Supine Bridge  - 1 x daily - 7 x weekly - 2 sets - 10 reps - Supine Lower Trunk Rotation  - 1 x daily - 7 x weekly - 2 sets - 10 reps - Sit to Stand with Armchair  - 1 x daily - 7 x weekly - 2 sets - 10 reps - Seated Hip  Abduction with Resistance  - 1 x daily - 7 x weekly - 2 sets - 10 reps - Seated Knee Lifts with Resistance  - 1 x daily - 7 x weekly - 2 sets - 10 reps - Standing March with Counter Support  - 1 x daily - 7 x weekly - 2 sets - 10 reps - Standing Hip Abduction with Counter Support  - 1 x daily - 7 x weekly - 2 sets - 10 reps   GOALS: Goals reviewed with patient? Yes  SHORT TERM GOALS: Target date: 09/05/23  Patient will be independent with initial HEP.  Goal status: INITIAL  2.  Patient will demonstrate TUG <20s  Baseline: 26s Goal status: INITIAL   LONG TERM GOALS: Target date: 10/17/23  Patient will be independent with advanced/ongoing HEP to improve outcomes and carryover.  Goal status: INITIAL  2.  Patient will report 75% improvement in low back pain to improve QOL.  Baseline: 10/10 Goal status: INITIAL  3.  Patient will demonstrate full pain free lumbar ROM to perform ADLs.   Baseline: 50% decrease Goal status: INITIAL  4.  Patient will demonstrate improved functional strength as demonstrated by 5/5 BLE MMT. Baseline: 3+/5 Goal status: INITIAL  5.  Patient will demonstrate 5xSTS <20s  Baseline: 27.12s Goal status: INITIAL  6.  Patient will demonstrate TUG <15s  Baseline: 26s Goal status: INITIAL   ASSESSMENT:  CLINICAL IMPRESSION: Patient is a 72 y.o. male who was seen today for physical therapy evaluation and treatment for low back pain and physical deconditioning. He presents with balance and gait abnormalities as well. He states he uses a rolling walker in the house and his cane outside the house. He says the walker is too hard for him to transport in and out of the car so that is why he takes the cane outside. With functional testing, he presents as a fall risk. Patient has some visual deficits that also limit his activity level. He is non compliant with HEP and does not do much activity on a regular basis. He walks with slowed gait and ataxic gait. Patient will  benefit from skilled PT to address his LBP, balance and gait impairments to increase his overall function and mobility.   OBJECTIVE IMPAIRMENTS: Abnormal gait, decreased balance, decreased coordination, decreased endurance, decreased mobility, difficulty walking, decreased ROM, decreased strength, impaired vision/preception, and pain.   ACTIVITY LIMITATIONS: sitting, standing, squatting, stairs, transfers, and locomotion level  PARTICIPATION LIMITATIONS: cleaning, laundry, driving, shopping, and community activity  PERSONAL FACTORS: Age, Education, Fitness, Past/current experiences, Time since onset of injury/illness/exacerbation, and 3+ comorbidities: DM, cancer, vision deficits, GERD  are also affecting patient's functional outcome.   REHAB POTENTIAL: Good  CLINICAL DECISION MAKING: Stable/uncomplicated  EVALUATION COMPLEXITY: Low  PLAN:  PT FREQUENCY: 2x/week  PT DURATION: 12 weeks  PLANNED INTERVENTIONS: Therapeutic exercises, Therapeutic activity, Neuromuscular re-education, Balance training, Gait training, Patient/Family education, Self Care, Joint mobilization, Stair training, Electrical stimulation, Spinal manipulation, Spinal mobilization, Cryotherapy, Moist heat, and Manual therapy  PLAN FOR NEXT SESSION: low back pain management, LE strengthening, balance   Cassie Freer, PT 07/25/2023,  1:04 PM

## 2023-07-25 ENCOUNTER — Ambulatory Visit: Payer: 59 | Attending: Family Medicine

## 2023-07-25 DIAGNOSIS — R5381 Other malaise: Secondary | ICD-10-CM | POA: Diagnosis not present

## 2023-07-25 DIAGNOSIS — M5440 Lumbago with sciatica, unspecified side: Secondary | ICD-10-CM | POA: Diagnosis present

## 2023-07-25 DIAGNOSIS — R2681 Unsteadiness on feet: Secondary | ICD-10-CM | POA: Diagnosis present

## 2023-07-25 DIAGNOSIS — R41842 Visuospatial deficit: Secondary | ICD-10-CM | POA: Diagnosis present

## 2023-07-25 DIAGNOSIS — M6281 Muscle weakness (generalized): Secondary | ICD-10-CM | POA: Insufficient documentation

## 2023-07-25 DIAGNOSIS — M5459 Other low back pain: Secondary | ICD-10-CM | POA: Diagnosis present

## 2023-07-25 DIAGNOSIS — R2689 Other abnormalities of gait and mobility: Secondary | ICD-10-CM | POA: Insufficient documentation

## 2023-07-27 ENCOUNTER — Ambulatory Visit: Payer: 59

## 2023-07-27 ENCOUNTER — Ambulatory Visit: Payer: 59 | Admitting: Physical Therapy

## 2023-07-31 ENCOUNTER — Ambulatory Visit: Payer: 59

## 2023-07-31 ENCOUNTER — Other Ambulatory Visit: Payer: Self-pay

## 2023-07-31 DIAGNOSIS — M5459 Other low back pain: Secondary | ICD-10-CM

## 2023-07-31 DIAGNOSIS — R2689 Other abnormalities of gait and mobility: Secondary | ICD-10-CM

## 2023-07-31 DIAGNOSIS — M6281 Muscle weakness (generalized): Secondary | ICD-10-CM

## 2023-07-31 DIAGNOSIS — R2681 Unsteadiness on feet: Secondary | ICD-10-CM

## 2023-07-31 NOTE — Therapy (Signed)
OUTPATIENT PHYSICAL THERAPY NEURO TREATMENT   Patient Name: Alec York MRN: [id number] DOB:1951/03/07, 72 y.o., male Today's Date: 07/31/2023   PCP: Darral Dash REFERRING PROVIDER: Burley Saver  END OF SESSION:    Past Medical History:  Diagnosis Date   Allergy    Coronary artery disease    Diabetes mellitus without complication (HCC)    Diabetic retinopathy (HCC)    Disabled 2/2 retinopathy   GERD (gastroesophageal reflux disease)    Hyperlipidemia    Hypertension    Past Surgical History:  Procedure Laterality Date   CARDIAC CATHETERIZATION     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   CABG x 4 [place]   Patient Active Problem List   Diagnosis Date Noted   Hearing loss 05/29/2023   Left arm weakness 05/22/2023   Frequent urination at night 05/22/2023   Lower extremity edema 05/22/2023   Dyspnea 01/13/2023   Abdominal bloating 01/13/2023   Hypertrophic scar 03/10/2022   Health maintenance examination 12/07/2021   Low back pain 09/06/2021   Gait disturbance 09/06/2021   Generalized weakness 09/06/2021   Pleural effusion on right 05/13/2021   Chronic heart failure with preserved ejection fraction (HCC) 01/28/2021   History of MI (myocardial infarction) 01/28/2021   CLL (chronic lymphocytic leukemia) (HCC) 03/04/2020   Erectile dysfunction 01/10/2020   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  Rationale for Evaluation and Treatment: Rehabilitation  SUBJECTIVE:                                                                                                                                                                                              SUBJECTIVE STATEMENT: I have pretty consistent  back pain. It doesn't seem to matter how I move or position. Use walker in the house.  Only taking Tylenol every day. I have a lot of weakness in my body.   Pt accompanied by: significant other  PERTINENT HISTORY: Patient is a 72 y.o. male here for bilateral knee pain, left arm, low back pain. Patient reports he has lost about 28 pounds over the past year related to his treatment for non-hodgkins lymphoma. Over the past several months he has had several aches and pains. Worst of which is in bilateral anterior knees. Worse with walking,  standing for prolonged periods. Using a cane to help get around. No acute injury though balance has been an issue, feels he's going to fall. Also with left anterior upper arm pain. No swelling or bruising and no acute injury. Feels more with carrying items and feels weaker. Left sided low back pain without radiation into his legs. No bowel/bladder dysfunction. No numbness/tingling.  PAIN:  Are you having pain? Yes: NPRS scale: 10/10 Pain location: low back in the middle Pain description: dull, ache, constant  Aggravating factors: walking, sitting, everything hurts  Relieving factors: Tylenol helps a little bit   PRECAUTIONS: None  RED FLAGS: None   WEIGHT BEARING RESTRICTIONS: No  FALLS: Has patient fallen in last 6 months? No  LIVING ENVIRONMENT: Lives with: lives with their family Lives in: House/apartment Stairs: Yes: Internal: 14 steps; on right going up Has following equipment at home: York point cane and Walker - 2 wheeled  PLOF: Independent with basic ADLs and Independent with household mobility without device  PATIENT GOALS: to have less pain   OBJECTIVE:   COGNITION: Overall cognitive status: Within functional limits for tasks assessed   SENSATION: WFL  MUSCLE LENGTH: Hamstrings: BLE tightness   POSTURE: rounded shoulders,  forward head, and increased thoracic kyphosis  LUMBAR ROM:  limited 50% in all directions with pain    LOWER EXTREMITY ROM:   grossly WFL    LOWER EXTREMITY MMT:  grossly 3+/5 BLE    TRANSFERS: Assistive device utilized: Counselling psychologist  Sit to stand: Modified independence Stand to sit: Modified independence Chair to chair: Modified independence  GAIT: Gait pattern: step to pattern, decreased arm swing- Right, decreased arm swing- Left, decreased step length- Right, decreased step length- Left, decreased stance time- Right, decreased stance time- Left, decreased stride length, narrow BOS, poor foot clearance- Right, and poor foot clearance- Left Distance walked: in clinic distances Assistive device utilized: [place] point cane Level of assistance: Modified independence Comments: slowed speed, unsteady and higher risk of fall  FUNCTIONAL TESTS:  5 times sit to stand: 27.12s  Timed up and go (TUG): 26s   TODAY'S TREATMENT:                                                                                                                              DATE:  07/31/23: Therapeutic exercise:  Initially reviewed HEP as he was instructed in at initial session: all for 10 reps, needed cueing and demo to utilize appropriate amplitude of movement Supine for LTR Supine bridging Sit to stand from elevated mat 10 reps Seated long arc quads Seated hip abd/ER with green t band Standing marching Standing alt hip abd  Nustep: 6 min, level 5, Ue's and LE's  Attempted to have patient perform standing balance activities in ll bars but he stated that he needed to stop due to GI upset, and discontinued session.  Assisted him to bathroom and informed son who was in lobby 07/27/23  NuStep LAQ HS Curls  Ball squeezes  STS  Box taps Step ups   EVAL 07/25/23    PATIENT EDUCATION: Education details: POC and HEP Person educated: Patient Education method: Explanation Education comprehension:  verbalized understanding  HOME EXERCISE PROGRAM: Access Code: [email or web address] URL: [email or web address] Date: 07/25/2023 Prepared by: Cassie Freer  Exercises - Supine Bridge  - 1 x daily - 7 x weekly - 2 sets - 10 reps - Supine Lower Trunk Rotation  - 1 x daily - 7 x weekly - 2 sets - 10 reps - Sit to Stand with Armchair  - 1 x daily - 7 x weekly - 2 sets - 10 reps - Seated Hip Abduction with Resistance  - 1 x daily - 7 x weekly - 2 sets - 10 reps - Seated Knee Lifts with Resistance  - 1 x daily - 7 x weekly - 2 sets - 10 reps - Standing March with Counter Support  - 1 x daily - 7 x weekly - 2 sets - 10 reps - Standing Hip Abduction with Counter Support  - 1 x daily - 7 x weekly - 2 sets - 10 reps   GOALS: Goals reviewed with patient? Yes  SHORT TERM GOALS: Target date: 09/05/23  Patient will be independent with initial HEP.  Goal status: INITIAL  2.  Patient will demonstrate TUG <20s  Baseline: 26s Goal status: INITIAL   LONG TERM GOALS: Target date: 10/17/23  Patient will be independent with advanced/ongoing HEP to improve outcomes and carryover.  Goal status: INITIAL  2.  Patient will report 75% improvement in low back pain to improve QOL.  Baseline: 10/10 Goal status: INITIAL  3.  Patient will demonstrate full pain free lumbar ROM to perform ADLs.   Baseline: 50% decrease Goal status: INITIAL  4.  Patient will demonstrate improved functional strength as demonstrated by 5/5 BLE MMT. Baseline: 3+/5 Goal status: INITIAL  5.  Patient will demonstrate 5xSTS <20s  Baseline: 27.12s Goal status: INITIAL  6.  Patient will demonstrate TUG <15s  Baseline: 26s Goal status: INITIAL   ASSESSMENT:  CLINICAL IMPRESSION: Patient is a 72 y.o. male who participated  today with skilled physical therapy treatment for low back pain and physical deconditioning. He participated in 30 min out of the 45 allocated then developed GI discomfort.  Needed cuing for his home  routine.  May benefit from estim  for pain in future visits.  Patient will benefit from skilled PT to address his LBP, balance and gait impairments to increase his overall function and mobility.   OBJECTIVE IMPAIRMENTS: Abnormal gait, decreased balance, decreased coordination, decreased endurance, decreased mobility, difficulty walking, decreased ROM, decreased strength, impaired vision/preception, and pain.   ACTIVITY LIMITATIONS: sitting, standing, squatting, stairs, transfers, and locomotion level  PARTICIPATION LIMITATIONS: cleaning, laundry, driving, shopping, and community activity  PERSONAL FACTORS: Age, Education, Fitness, Past/current experiences, Time since onset of injury/illness/exacerbation, and 3+ comorbidities: DM, cancer, vision deficits, GERD  are also affecting patient's functional outcome.   REHAB POTENTIAL: Good  CLINICAL DECISION MAKING: Stable/uncomplicated  EVALUATION COMPLEXITY: Low  PLAN:  PT FREQUENCY: 2x/week  PT DURATION: 12 weeks  PLANNED INTERVENTIONS: Therapeutic exercises, Therapeutic activity, Neuromuscular re-education, Balance training, Gait training, Patient/Family education, Self Care, Joint mobilization, Stair training, Electrical stimulation, Spinal manipulation, Spinal mobilization, Cryotherapy, Moist heat, and Manual therapy  PLAN FOR NEXT SESSION: low back pain management, LE strengthening, balance   Page Pucciarelli L Arcenia Scarbro, PT, DPT, OCS 07/31/2023, 3:53 PM

## 2023-08-01 ENCOUNTER — Other Ambulatory Visit: Payer: Self-pay | Admitting: Family Medicine

## 2023-08-01 ENCOUNTER — Other Ambulatory Visit: Payer: Self-pay | Admitting: Student

## 2023-08-01 DIAGNOSIS — I2510 Atherosclerotic heart disease of native coronary artery without angina pectoris: Secondary | ICD-10-CM

## 2023-08-01 DIAGNOSIS — I252 Old myocardial infarction: Secondary | ICD-10-CM

## 2023-08-02 ENCOUNTER — Other Ambulatory Visit: Payer: Self-pay

## 2023-08-02 ENCOUNTER — Ambulatory Visit: Payer: 59

## 2023-08-02 DIAGNOSIS — M5459 Other low back pain: Secondary | ICD-10-CM | POA: Diagnosis not present

## 2023-08-02 DIAGNOSIS — R2689 Other abnormalities of gait and mobility: Secondary | ICD-10-CM

## 2023-08-02 DIAGNOSIS — M5440 Lumbago with sciatica, unspecified side: Secondary | ICD-10-CM

## 2023-08-02 DIAGNOSIS — M6281 Muscle weakness (generalized): Secondary | ICD-10-CM

## 2023-08-02 DIAGNOSIS — R2681 Unsteadiness on feet: Secondary | ICD-10-CM

## 2023-08-02 DIAGNOSIS — R41842 Visuospatial deficit: Secondary | ICD-10-CM

## 2023-08-02 NOTE — Therapy (Signed)
OUTPATIENT PHYSICAL THERAPY NEURO TREATMENT   Patient Name: Alec York MRN: [id number] DOB:[birth year], 72 y.o., male Today's Date: 08/02/2023   PCP: Darral Dash REFERRING PROVIDER: Burley Saver  END OF SESSION:  PT End of Session - 08/02/23 1520     Visit Number 3    Date for PT Re-Evaluation 10/17/23    Authorization Type [employer]    PT Start Time 1515    PT Stop Time 1600    PT Time Calculation (min) 45 min              Past Medical History:  Diagnosis Date   Allergy    Coronary artery disease    Diabetes mellitus without complication (HCC)    Diabetic retinopathy (HCC)    Disabled 2/2 retinopathy   GERD (gastroesophageal reflux disease)    Hyperlipidemia    Hypertension    Past Surgical History:  Procedure Laterality Date   CARDIAC CATHETERIZATION     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   CABG x 4 [place]   Patient Active Problem List   Diagnosis Date Noted   Hearing loss 05/29/2023   Left arm weakness 05/22/2023   Frequent urination at night 05/22/2023   Lower extremity edema 05/22/2023   Dyspnea 01/13/2023   Abdominal bloating 01/13/2023   Hypertrophic scar 03/10/2022   Health maintenance examination 12/07/2021   Low back pain 09/06/2021   Gait disturbance 09/06/2021   Generalized weakness 09/06/2021   Pleural effusion on right 05/13/2021   Chronic heart failure with preserved ejection fraction (HCC) 01/28/2021   History of MI (myocardial infarction) 01/28/2021   CLL (chronic lymphocytic leukemia) (HCC) 03/04/2020   Erectile dysfunction 01/10/2020   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) 05/31/2018   Lymphadenopathy 04/12/2018   PAD (peripheral artery disease) (HCC) 07/07/2017   Knee pain, chronic 06/15/2017   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) 05/13/2015   Essential hypertension 05/13/2015    ONSET DATE: 05/22/23  REFERRING DIAG:  R53.81 (ICD-10-CM) - Physical deconditioning    THERAPY DIAG:   Other low back pain  Other abnormalities of gait and mobility  Muscle weakness (generalized)  Unsteadiness on feet  Visuospatial deficit  Bilateral low back pain with sciatica, sciatica laterality unspecified, unspecified chronicity  Rationale for Evaluation and Treatment: Rehabilitation  SUBJECTIVE:                                                                                                                                                                                             SUBJECTIVE STATEMENT: Stomach is ok today.   Pt accompanied by: significant other  PERTINENT HISTORY: Patient is a 72 y.o. male here for bilateral knee pain, left arm, low back pain. Patient reports he has lost about 28 pounds over the past year related to his treatment for non-hodgkins lymphoma. Over the past several months he has had several aches and pains. Worst of which is in bilateral anterior knees. Worse with walking, standing for prolonged periods. Using a cane to help get around. No acute injury though balance has been an issue, feels he's going to fall. Also with left anterior upper arm pain. No swelling or bruising and no acute injury. Feels more with carrying items and feels weaker. Left sided low back pain without radiation into his legs. No bowel/bladder dysfunction. No numbness/tingling.  PAIN:  Are you having pain? Yes: NPRS scale: 10/10 Pain location: low back in the middle Pain description: dull, ache, constant  Aggravating factors: walking, sitting, everything hurts  Relieving factors: Tylenol helps a little bit   PRECAUTIONS: None  RED FLAGS: None   WEIGHT BEARING RESTRICTIONS: No  FALLS: Has patient fallen in last 6 months? No  LIVING ENVIRONMENT: Lives with: lives with their family Lives in: House/apartment Stairs: Yes: Internal: 14 steps; on right going up Has following equipment at home: Single point cane and Walker - 2 wheeled  PLOF: Independent with basic ADLs and  Independent with household mobility without device  PATIENT GOALS: to have less pain   OBJECTIVE:   COGNITION: Overall cognitive status: Within functional limits for tasks assessed   SENSATION: WFL  MUSCLE LENGTH: Hamstrings: BLE tightness   POSTURE: rounded shoulders, forward head, and increased thoracic kyphosis  LUMBAR ROM:  limited 50% in all directions with pain    LOWER EXTREMITY ROM:   grossly WFL    LOWER EXTREMITY MMT:  grossly 3+/5 BLE    TRANSFERS: Assistive device utilized: Counselling psychologist  Sit to stand: Modified independence Stand to sit: Modified independence Chair to chair: Modified independence  GAIT: Gait pattern: step to pattern, decreased arm swing- Right, decreased arm swing- Left, decreased step length- Right, decreased step length- Left, decreased stance time- Right, decreased stance time- Left, decreased stride length, narrow BOS, poor foot clearance- Right, and poor foot clearance- Left Distance walked: in clinic distances Assistive device utilized: Single point cane Level of assistance: Modified independence Comments: slowed speed, unsteady and higher risk of fall  FUNCTIONAL TESTS:  5 times sit to stand: 27.12s  Timed up and go (TUG): 26s   TODAY'S TREATMENT:                                                                                                                              DATE:  08/02/23: Therapeutic exercise:  Nustep level 4 Ue's and LE's 6 min In ll bars for static standing on airex 30 sec, then horizontal head turns, 15 x, needed CGA lost balance with poor righting reaction noted Seated long arc quads 2# cuff weights, 10 x 1 each  leg  Leg press, 20# 2 sets 10 reps Supine with green physioball, 65 cm for LTR Alt SLR B heels on ball Bridging 12 reps thighs on ball  Hook lying for green t band resisted hip abd/ER Sit to stand from hi lo mat feet on airex 5 reps  07/31/23: Therapeutic exercise:  Initially reviewed HEP as  he was instructed in at initial session: all for 10 reps, needed cueing and demo to utilize appropriate amplitude of movement Supine for LTR Supine bridging Sit to stand from elevated mat 10 reps Seated long arc quads Seated hip abd/ER with green t band Standing marching Standing alt hip abd  Nustep: 6 min, level 5, Ue's and LE's  Attempted to have patient perform standing balance activities in ll bars but he stated that he needed to stop due to GI upset, and discontinued session.  Assisted him to bathroom and informed son who was in lobby 07/27/23 NuStep LAQ HS Curls  Ball squeezes  STS  Box taps Step ups   EVAL 07/25/23    PATIENT EDUCATION: Education details: POC and HEP Person educated: Patient Education method: Explanation Education comprehension: verbalized understanding  HOME EXERCISE PROGRAM: Access Code: [email or web address] URL: [email or web address] Date: 07/25/2023 Prepared by: Cassie Freer  Exercises - Supine Bridge  - 1 x daily - 7 x weekly - 2 sets - 10 reps - Supine Lower Trunk Rotation  - 1 x daily - 7 x weekly - 2 sets - 10 reps - Sit to Stand with Armchair  - 1 x daily - 7 x weekly - 2 sets - 10 reps - Seated Hip Abduction with Resistance  - 1 x daily - 7 x weekly - 2 sets - 10 reps - Seated Knee Lifts with Resistance  - 1 x daily - 7 x weekly - 2 sets - 10 reps - Standing March with Counter Support  - 1 x daily - 7 x weekly - 2 sets - 10 reps - Standing Hip Abduction with Counter Support  - 1 x daily - 7 x weekly - 2 sets - 10 reps   GOALS: Goals reviewed with patient? Yes  SHORT TERM GOALS: Target date: 09/05/23  Patient will be independent with initial HEP.  Goal status: INITIAL  2.  Patient will demonstrate TUG <20s  Baseline: 26s Goal status: INITIAL   LONG TERM GOALS: Target date: 10/17/23  Patient will be independent with advanced/ongoing HEP to improve outcomes and carryover.  Goal status: INITIAL  2.  Patient will report 75%  improvement in low back pain to improve QOL.  Baseline: 10/10 Goal status: INITIAL  3.  Patient will demonstrate full pain free lumbar ROM to perform ADLs.   Baseline: 50% decrease Goal status: INITIAL  4.  Patient will demonstrate improved functional strength as demonstrated by 5/5 BLE MMT. Baseline: 3+/5 Goal status: INITIAL  5.  Patient will demonstrate 5xSTS <20s  Baseline: 27.12s Goal status: INITIAL  6.  Patient will demonstrate TUG <15s  Baseline: 26s Goal status: INITIAL   ASSESSMENT:  CLINICAL IMPRESSION: Patient is a 72 y.o. male who participated  today with skilled physical therapy treatment for low back pain and physical deconditioning. He participated in 40 min today.  Very weak, loss of overall muscle mass. Also diminished righting reactions noted. He tolerated today's session much better than last time.   Patient will benefit from skilled PT to address his LBP, balance and gait impairments to increase his overall function and mobility.   OBJECTIVE  IMPAIRMENTS: Abnormal gait, decreased balance, decreased coordination, decreased endurance, decreased mobility, difficulty walking, decreased ROM, decreased strength, impaired vision/preception, and pain.   ACTIVITY LIMITATIONS: sitting, standing, squatting, stairs, transfers, and locomotion level  PARTICIPATION LIMITATIONS: cleaning, laundry, driving, shopping, and community activity  PERSONAL FACTORS: Age, Education, Fitness, Past/current experiences, Time since onset of injury/illness/exacerbation, and 3+ comorbidities: DM, cancer, vision deficits, GERD  are also affecting patient's functional outcome.   REHAB POTENTIAL: Good  CLINICAL DECISION MAKING: Stable/uncomplicated  EVALUATION COMPLEXITY: Low  PLAN:  PT FREQUENCY: 2x/week  PT DURATION: 12 weeks  PLANNED INTERVENTIONS: Therapeutic exercises, Therapeutic activity, Neuromuscular re-education, Balance training, Gait training, Patient/Family education, Self  Care, Joint mobilization, Stair training, Electrical stimulation, Spinal manipulation, Spinal mobilization, Cryotherapy, Moist heat, and Manual therapy  PLAN FOR NEXT SESSION: low back pain management, LE strengthening, balance   Alizae Bechtel L Gladie Gravette, PT, DPT, OCS 08/02/2023, 4:00 PM

## 2023-08-04 ENCOUNTER — Telehealth: Payer: Self-pay | Admitting: Hematology

## 2023-08-04 DIAGNOSIS — E11311 Type 2 diabetes mellitus with unspecified diabetic retinopathy with macular edema: Secondary | ICD-10-CM

## 2023-08-07 ENCOUNTER — Ambulatory Visit: Payer: 59

## 2023-08-07 NOTE — Telephone Encounter (Signed)
Patient is calling and would like a referral to an eye doctor. Patient said he forgot to mention it at last visit.

## 2023-08-09 ENCOUNTER — Ambulatory Visit: Payer: 59

## 2023-08-24 ENCOUNTER — Other Ambulatory Visit: Payer: Self-pay

## 2023-08-24 DIAGNOSIS — C8590 Non-Hodgkin lymphoma, unspecified, unspecified site: Secondary | ICD-10-CM

## 2023-08-25 ENCOUNTER — Inpatient Hospital Stay: Payer: 59 | Admitting: Hematology

## 2023-08-25 ENCOUNTER — Inpatient Hospital Stay: Payer: 59 | Attending: Hematology

## 2023-08-25 ENCOUNTER — Other Ambulatory Visit: Payer: Self-pay

## 2023-08-25 VITALS — BP 129/60 | HR 76 | Temp 97.0°F | Resp 18 | Wt 130.2 lb

## 2023-08-25 DIAGNOSIS — C8590 Non-Hodgkin lymphoma, unspecified, unspecified site: Secondary | ICD-10-CM | POA: Insufficient documentation

## 2023-08-25 DIAGNOSIS — Z79899 Other long term (current) drug therapy: Secondary | ICD-10-CM | POA: Diagnosis not present

## 2023-08-25 DIAGNOSIS — Z87891 Personal history of nicotine dependence: Secondary | ICD-10-CM | POA: Diagnosis not present

## 2023-08-25 DIAGNOSIS — E11319 Type 2 diabetes mellitus with unspecified diabetic retinopathy without macular edema: Secondary | ICD-10-CM

## 2023-08-25 DIAGNOSIS — F411 Generalized anxiety disorder: Secondary | ICD-10-CM | POA: Insufficient documentation

## 2023-08-25 LAB — CMP (CANCER CENTER ONLY)
ALT: 10 U/L (ref 0–44)
AST: 15 U/L (ref 15–41)
Albumin: 4.3 g/dL (ref 3.5–5.0)
Alkaline Phosphatase: 79 U/L (ref 38–126)
Anion gap: 7 (ref 5–15)
BUN: 21 mg/dL (ref 8–23)
CO2: 29 mmol/L (ref 22–32)
Calcium: 10 mg/dL (ref 8.9–10.3)
Chloride: 101 mmol/L (ref 98–111)
Creatinine: 0.96 mg/dL (ref 0.61–1.24)
GFR, Estimated: >60 mL/min (ref >60)
Glucose, Bld: 193 mg/dL — ABNORMAL HIGH (ref 70–99)
Potassium: 4.5 mmol/L (ref 3.5–5.1)
Sodium: 137 mmol/L (ref 135–145)
Total Bilirubin: 0.3 mg/dL (ref 0.3–1.2)
Total Protein: 8.2 g/dL — ABNORMAL HIGH (ref 6.5–8.1)

## 2023-08-25 LAB — CBC WITH DIFFERENTIAL (CANCER CENTER ONLY)
Abs Immature Granulocytes: 0.03 10*3/uL (ref 0.00–0.07)
Basophils Absolute: 0.1 10*3/uL (ref 0.0–0.1)
Basophils Relative: 1 %
Eosinophils Absolute: 0.4 10*3/uL (ref 0.0–0.5)
Eosinophils Relative: 3 %
HCT: 41.1 % (ref 39.0–52.0)
Hemoglobin: 12.8 g/dL — ABNORMAL LOW (ref 13.0–17.0)
Immature Granulocytes: 0 %
Lymphocytes Relative: 37 %
Lymphs Abs: 4.5 10*3/uL — ABNORMAL HIGH (ref 0.7–4.0)
MCH: 25.8 pg — ABNORMAL LOW (ref 26.0–34.0)
MCHC: 31.1 g/dL (ref 30.0–36.0)
MCV: 82.9 fL (ref 80.0–100.0)
Monocytes Absolute: 1.1 10*3/uL — ABNORMAL HIGH (ref 0.1–1.0)
Monocytes Relative: 9 %
Neutro Abs: 6.1 10*3/uL (ref 1.7–7.7)
Neutrophils Relative %: 50 %
Platelet Count: 267 10*3/uL (ref 150–400)
RBC: 4.96 MIL/uL (ref 4.22–5.81)
RDW: 15.3 % (ref 11.5–15.5)
Smear Review: NORMAL
WBC Count: 12.3 10*3/uL — ABNORMAL HIGH (ref 4.0–10.5)
nRBC: 0 % (ref 0.0–0.2)

## 2023-08-25 LAB — VITAMIN B12: Vitamin B-12: 128 pg/mL — ABNORMAL LOW (ref 180–914)

## 2023-08-25 LAB — FERRITIN: Ferritin: 33 ng/mL (ref 24–336)

## 2023-08-25 LAB — LACTATE DEHYDROGENASE: LDH: 143 U/L (ref 98–192)

## 2023-08-25 NOTE — Progress Notes (Signed)
HEMATOLOGY/ONCOLOGY CLINIC NOTE  Date of Service: 08/25/23   Patient Care Team: Darral Dash, DO as PCP - General (Family Medicine) O'Neal, Ronnald Ramp, MD as Consulting Physician (Cardiology) Johney Maine, MD as Consulting Physician (Hematology) Luciano Cutter, MD as Consulting Physician (Pulmonary Disease)  Suzanna Obey, MD as ENT  CHIEF COMPLAINTS/PURPOSE OF CONSULTATION:  Follow-up for continued evaluation and management of low-grade non-Hodgkin's lymphoma  CURRENT TREATMENT: Watchful observation  PREVIOUS TREATMENT Rituxan weekly x 4 doses  INTERVAL HISTORY:   Revis Jerlyn Ly iis here for continued evaluation and management of his notable marginal zone lymphoma. Patient was last seen by me on 03/10/2023 and was doing well overall with no new medical concerns.   Today, he is accompanied by his wife. Patient has no acute new symptoms. He denies any fever, chills, night sweats, new lumps/bumps, new SOB, or abdominal pain/distention. The lumps in his neck have decreased in size. His energy levels have been normal, though he notes chronic exertion issues related to previous heart issues. Patient has no other acute new focal symptoms.   He has been taking iron supplements regularly. Patient notes that he had not been taking vitamin B12 regularly for some time, though he has been taking it more regularly recently.   MEDICAL HISTORY:  Past Medical History:  Diagnosis Date   Allergy    Coronary artery disease    Diabetes mellitus without complication (HCC)    Diabetic retinopathy (HCC)    Disabled 2/2 retinopathy   GERD (gastroesophageal reflux disease)    Hyperlipidemia    Hypertension     SURGICAL HISTORY: Past Surgical History:  Procedure Laterality Date   CARDIAC CATHETERIZATION     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   CABG x 4 [place]    SOCIAL HISTORY: Social History   Socioeconomic History   Marital status: Married    Spouse name:  [name]   Number of children: 2   Years of education: 16   Highest education level: Bachelor's degree (e.g., BA, AB, BS)  Occupational History   Occupation: Retired  Tobacco Use   Smoking status: Former    Current packs/day: 0.00    Average packs/day: 0.5 packs/day for 39.0 years (19.5 ttl pk-yrs)    Types: Cigarettes    Start date: 65    Quit date: 2010    Years since quitting: 14.7    Passive exposure: Past   Smokeless tobacco: Never  Vaping Use   Vaping status: Never Used  Substance and Sexual Activity   Alcohol use: No    Alcohol/week: 0.0 standard drinks of alcohol   Drug use: No   Sexual activity: Yes    Birth control/protection: None  Other Topics Concern   Not on file  Social History Narrative   Patient lives with his wife Philis Fendt [facility] patient.)    Patient has 2 children, one [place] and one [place].    Patient uses [employer] transportation and Medicare transportation.    College in [place].   Social Determinants of Health   Financial Resource Strain: Low Risk  (01/12/2022)   Overall Financial Resource Strain (CARDIA)    Difficulty of Paying Living Expenses: Not hard at all  Food Insecurity: No Food Insecurity (01/12/2022)   Hunger Vital Sign    Worried About Running Out of Food in the Last Year: Never true    Ran Out of Food in the Last Year: Never true  Transportation Needs: No Transportation Needs (01/12/2022)   PRAPARE -  Administrator, Civil Service (Medical): No    Lack of Transportation (Non-Medical): No  Physical Activity: Insufficiently Active (01/12/2022)   Exercise Vital Sign    Days of Exercise per Week: 7 days    Minutes of Exercise per Session: 10 min  Stress: No Stress Concern Present (01/12/2022)   [employer] of Occupational Health - Occupational Stress Questionnaire    Feeling of Stress : Not at all  Social Connections: Moderately Isolated (01/12/2022)   Social Connection and Isolation Panel [NHANES]    Frequency of Communication with  Friends and Family: Twice a week    Frequency of Social Gatherings with Friends and Family: More than three times a week    Attends Religious Services: Never    Database administrator or Organizations: No    Attends Banker Meetings: Never    Marital Status: Married  Catering manager Violence: Not At Risk (01/12/2022)   Humiliation, Afraid, Rape, and Kick questionnaire    Fear of Current or Ex-Partner: No    Emotionally Abused: No    Physically Abused: No    Sexually Abused: No    FAMILY HISTORY: Family History  Problem Relation Age of Onset   Heart disease Mother    Heart disease Father    Heart disease Brother    Hypertension Brother    Diabetes Brother     ALLERGIES:  has No Known Allergies.  MEDICATIONS:  Current Outpatient Medications  Medication Sig Dispense Refill   Accu-Chek FastClix Lancets MISC Use to test sugars up to 4 times daily.  Dx Code: E11.319 102 each 12   Accu-Chek Softclix Lancets lancets Use as instructed 100 each 12   aspirin EC 81 MG tablet Take 1 tablet (81 mg total) by mouth daily. Swallow whole. 90 tablet 3   b complex vitamins capsule Take 1 capsule by mouth daily. 30 capsule 11   blood glucose meter kit and supplies KIT Dispense based on patient and insurance preference. Use up to four times daily as directed. Dx E11.319 1 each 0   Blood Glucose Monitoring Suppl (ACCU-CHEK GUIDE) w/Device KIT 1 each by Does not apply route 4 (four) times daily as needed. Use to test sugars up to 4 times daily.  Dx Code: E11.319 1 kit 0   Blood Pressure Monitoring (BLOOD PRESSURE CUFF) MISC 1 Units by Does not apply route daily. 1 each 0   cholecalciferol (VITAMIN D3) 25 MCG (1000 UNIT) tablet Take 2,000 Units by mouth daily.     Cyanocobalamin (B-12) 1000 MCG SUBL Place 1,000 mcg under the tongue daily. (Patient not taking: Reported on 05/22/2023) 30 tablet 11   diclofenac Sodium (VOLTAREN) 1 % GEL Apply 2 g topically 4 (four) times daily. (Patient not  taking: Reported on 01/12/2022) 150 g 3   Dulaglutide (TRULICITY) 1.5 MG/0.5ML SOPN Inject 1.5 mg into the skin once a week. 2 mL 3   empagliflozin (JARDIANCE) 25 MG TABS tablet Take 1 tablet (25 mg total) by mouth daily. 90 tablet 0   EQ ALLERGY RELIEF, CETIRIZINE, 10 MG tablet Take 1 tablet by mouth once daily 90 tablet 0   glucose blood (ACCU-CHEK GUIDE) test strip Use to test sugars up to 4 times daily.  Dx Code: E11.319 200 each 12   hydrocortisone cream 0.5 % Apply 1 application. topically 2 (two) times daily. 30 g 0   insulin degludec (TRESIBA FLEXTOUCH) 100 UNIT/ML FlexTouch Pen INJECT 15 UNITS SUBCUTANEOUSLY ONCE DAILY 15 mL 3  Insulin Pen Needle (RELION PEN NEEDLES) 31G X 6 MM MISC USE AS DIRECTED TO  CHECK  BLOOD  SUGAR 100 each 3   iron polysaccharides (NIFEREX) 150 MG capsule Take 1 capsule (150 mg total) by mouth daily. 30 capsule 5   Lancet Devices (ACCU-CHEK SOFTCLIX) lancets Use as instructed up to 4 times daily.  Dx E11.319 400 each 3   Lancets Misc. (ACCU-CHEK FASTCLIX LANCET) KIT Use to test sugars up to 4 times daily.  Dx Code: E11.319 1 kit 0   meloxicam (MOBIC) 15 MG tablet Take 1 tablet (15 mg total) by mouth daily. (Patient not taking: Reported on 05/22/2023) 30 tablet 2   metFORMIN (GLUCOPHAGE) 1000 MG tablet Take 1 tablet by mouth twice daily 180 tablet 0   metoprolol succinate (TOPROL-XL) 25 MG 24 hr tablet Take 1 tablet by mouth once daily 90 tablet 0   nitroGLYCERIN (NITROSTAT) 0.4 MG SL tablet Place 1 tablet (0.4 mg total) under the tongue every 5 (five) minutes as needed for chest pain. 30 tablet 12   rosuvastatin (CRESTOR) 10 MG tablet Take 1 tablet by mouth once daily 90 tablet 0   Current Facility-Administered Medications  Medication Dose Route Frequency Provider Last Rate Last Admin   mirabegron ER (MYRBETRIQ) tablet 25 mg  25 mg Oral Daily Shitarev, Dimitry, MD        REVIEW OF SYSTEMS:    10 Point review of Systems was done is negative except as noted  above.   PHYSICAL EXAMINATION: ECOG FS:1 - Symptomatic but completely ambulatory .BP 129/60 (BP Location: Left Arm, Patient Position: Sitting)   Pulse 76   Temp (!) 97 F (36.1 C) (Temporal)   Resp 18   Wt 130 lb 3 oz (59.1 kg)   SpO2 100%   BMI 19.79 kg/m    GENERAL:alert, in no acute distress and comfortable SKIN: no acute rashes, no significant lesions EYES: conjunctiva are pink and non-injected, sclera anicteric OROPHARYNX: MMM, no exudates, no oropharyngeal erythema or ulceration NECK: supple, no JVD LYMPH:  no palpable lymphadenopathy in the cervical, axillary or inguinal regions LUNGS: clear to auscultation b/l with normal respiratory effort HEART: regular rate & rhythm ABDOMEN:  normoactive bowel sounds , non tender, not distended. Extremity: no pedal edema PSYCH: alert & oriented x 3 with fluent speech NEURO: no focal motor/sensory deficits   LABORATORY DATA:  I have reviewed the data as listed  .    Latest Ref Rng & Units 08/25/2023   10:54 AM 05/22/2023   11:51 AM 03/10/2023    8:21 AM  CBC  WBC 4.0 - 10.5 K/uL 12.3  14.4  13.6   Hemoglobin 13.0 - 17.0 g/dL 16.1  09.6  04.5   Hematocrit 39.0 - 52.0 % 41.1  37.7  37.4   Platelets 150 - 400 K/uL 267  295  256    .CBC    Component Value Date/Time   WBC 12.3 (H) 08/25/2023 1054   WBC 11.1 (H) 05/17/2022 0815   RBC 4.96 08/25/2023 1054   HGB 12.8 (L) 08/25/2023 1054   HGB 11.8 (L) 05/22/2023 1151   HCT 41.1 08/25/2023 1054   HCT 37.7 05/22/2023 1151   PLT 267 08/25/2023 1054   PLT 295 05/22/2023 1151   MCV 82.9 08/25/2023 1054   MCV 81 05/22/2023 1151   MCH 25.8 (L) 08/25/2023 1054   MCHC 31.1 08/25/2023 1054   RDW 15.3 08/25/2023 1054   RDW 13.6 05/22/2023 1151   LYMPHSABS PENDING 08/25/2023 1054  LYMPHSABS 5.4 (H) 05/22/2023 1151   MONOABS PENDING 08/25/2023 1054   EOSABS PENDING 08/25/2023 1054   EOSABS 0.3 05/22/2023 1151   BASOSABS PENDING 08/25/2023 1054   BASOSABS 0.1 05/22/2023 1151      .    Latest Ref Rng & Units 08/25/2023   10:54 AM 05/22/2023   11:51 AM 03/10/2023    8:21 AM  CMP  Glucose 70 - 99 mg/dL 086  578  469   BUN 8 - 23 mg/dL 21  13  14    Creatinine 0.61 - 1.24 mg/dL 6.29  5.28  4.13   Sodium 135 - 145 mmol/L 137  138  137   Potassium 3.5 - 5.1 mmol/L 4.5  4.6  4.2   Chloride 98 - 111 mmol/L 101  100  102   CO2 22 - 32 mmol/L 29  24  28    Calcium 8.9 - 10.3 mg/dL 24.4  9.6  9.3   Total Protein 6.5 - 8.1 g/dL 8.2   7.4   Total Bilirubin 0.3 - 1.2 mg/dL 0.3   0.3   Alkaline Phos 38 - 126 U/L 79   75   AST 15 - 41 U/L 15   15   ALT 0 - 44 U/L 10   11    . Lab Results  Component Value Date   LDH 143 08/25/2023    06/18/18 Right Cervical LN Needle/core Biopsy:    05/23/18 Fine Needle Aspiration Flow Cytometry:    RADIOGRAPHIC STUDIES: I have personally reviewed the radiological images as listed and agreed with the findings in the report. No results found.  ASSESSMENT & PLAN:   72 y.o. male with  1. 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 Benign behavior is not established. Bulky adenopathy such as this could relate to lymphoma, or metastatic squamous cell carcinoma. There is no visible extranodal spread of tumor.  -06/18/18 biopsy favored a low grade Marginal Zone Lymphoma -06/15/18 PET/CT revealed Enlarged and hypermetabolic lymph nodes involving the neck, axilla, subpectoral and mediastinal lymph nodes. No lymphadenopathy below the diaphragm. No findings for osseous lymphoma -06/07/18 ECHO for treatment planning revealed a normal ejection fraction -02/02/21 PET/CT showed unchanged  mediastinal lymphadenopathy with similar hypermetabolic activity. Interval development of moderate right and small left pleural effusion R > L.  -06/07/2021 Underwent thoracentesis with removal of 200 ml of straw colored fluid. Cytology revealed predominantly lymphocytic (95%) with 8-9% B-cells present. This is suggestive of involvement of pleural fluid by lymphoma.   6 patient's labs from today were discussed in detail with him  #2 generalized anxiety related to family stressors with the loss of his son about a year ago and significant trouble medical interventions including CABG and lymphoma treatment. -Stable  #3  Diabetes type 2 Continue follow-up with primary care physician for continued management of his diabetes  PLAN:  -Discussed lab results on 08/25/23 in detail with patient. CBC normal, showed WBC stable at 12.3K, hemoglobin of 12.8, and platelets normal at 267K. -Leukocytosis stable -hemoglobin improved to 12.8 with iron. Hgb was 11.8 on 05/22/2023 -CMP shows glucose levels of 193 mg/dL -LDH wnl at 010 -patient has no new consitutional symptoms -No  new lab or clinical symptoms suggestive of lymphoma progression requiring systemic treatment at this time  -continue oral iron and B12 replacement -we shall let him know if there are any changes on iron repleacement based on iron labs -discussed risk factors of B12 and iron deficiencies including a vegetarian diet as well as Metformin as a risk factor for B12 deficiency -discussed untreated B12 deficiency would be a risk factor for neuropathy in addition to diabetes -advised patient to stay UTD with age-appropriate vaccines including flu, COVID-19 booster, and RSV -answered all of patient's questions in detail -Patient is planning on traveling to [place] and would like to delay his 22-month follow-up to his [birth year] on 04/09/2021  Follow-up RTC with Dr Candise Che with labs in 6 months (patient prefers appointment on 04/08/2024)  The total time  spent in the appointment was 25 minutes* .  All of the patient's questions were answered with apparent satisfaction. The patient knows to call the clinic with any problems, questions or concerns.   Wyvonnia Lora MD MS AAHIVMS [facility] Hematology/Oncology Physician [facility]  .*Total Encounter Time as defined by the Centers for Medicare and Medicaid Services includes, in addition to the face-to-face time of a patient visit (documented in the note above) non-face-to-face time: obtaining and reviewing outside history, ordering and reviewing medications, tests or procedures, care coordination (communications with other health care professionals or caregivers) and documentation in the medical record.    I,Mitra Faeizi,acting as a Neurosurgeon for Wyvonnia Lora, MD.,have documented all relevant documentation on the behalf of Wyvonnia Lora, MD,as directed by  Wyvonnia Lora, MD while in the presence of Wyvonnia Lora, MD.  .I have reviewed the above documentation for accuracy and completeness, and I agree with the above. Johney Maine MD

## 2023-08-26 MED ORDER — RELION PEN NEEDLES 31G X 6 MM MISC: 3 refills | Status: DC

## 2023-08-28 ENCOUNTER — Other Ambulatory Visit: Payer: Self-pay | Admitting: Hematology

## 2023-08-28 DIAGNOSIS — C8590 Non-Hodgkin lymphoma, unspecified, unspecified site: Secondary | ICD-10-CM

## 2023-08-30 ENCOUNTER — Ambulatory Visit: Payer: 59 | Attending: Family Medicine

## 2023-08-30 DIAGNOSIS — M5459 Other low back pain: Secondary | ICD-10-CM

## 2023-08-30 DIAGNOSIS — R41842 Visuospatial deficit: Secondary | ICD-10-CM

## 2023-08-30 DIAGNOSIS — R2681 Unsteadiness on feet: Secondary | ICD-10-CM

## 2023-08-30 DIAGNOSIS — R2689 Other abnormalities of gait and mobility: Secondary | ICD-10-CM | POA: Diagnosis present

## 2023-08-30 DIAGNOSIS — M6281 Muscle weakness (generalized): Secondary | ICD-10-CM

## 2023-08-30 NOTE — Therapy (Signed)
OUTPATIENT PHYSICAL THERAPY NEURO TREATMENT   Patient Name: Alec York MRN: [id number] DOB:1950/12/15, 72 y.o., male Today's Date: 08/30/2023   PCP: Darral Dash REFERRING PROVIDER: Burley Saver  END OF SESSION:  PT End of Session - 08/30/23 1659     Visit Number 4    Date for PT Re-Evaluation 10/17/23    Authorization Type [employer]    PT Start Time 1700    PT Stop Time 1745    PT Time Calculation (min) 45 min               Past Medical History:  Diagnosis Date   Allergy    Coronary artery disease    Diabetes mellitus without complication (HCC)    Diabetic retinopathy (HCC)    Disabled 2/2 retinopathy   GERD (gastroesophageal reflux disease)    Hyperlipidemia    Hypertension    Past Surgical History:  Procedure Laterality Date   CARDIAC CATHETERIZATION     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   CABG x 4 [place]   Patient Active Problem List   Diagnosis Date Noted   Hearing loss 05/29/2023   Left arm weakness 05/22/2023   Frequent urination at night 05/22/2023   Lower extremity edema 05/22/2023   Dyspnea 01/13/2023   Abdominal bloating 01/13/2023   Hypertrophic scar 03/10/2022   Health maintenance examination 12/07/2021   Low back pain 09/06/2021   Gait disturbance 09/06/2021   Generalized weakness 09/06/2021   Pleural effusion on right 05/13/2021   Chronic heart failure with preserved ejection fraction (HCC) 01/28/2021   History of MI (myocardial infarction) 01/28/2021   CLL (chronic lymphocytic leukemia) (HCC) 03/04/2020   Erectile dysfunction 01/10/2020   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) 05/31/2018   Lymphadenopathy 04/12/2018   PAD (peripheral artery disease) (HCC) 07/07/2017   Knee pain, chronic 06/15/2017   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) 05/13/2015   Essential hypertension 05/13/2015    ONSET DATE: 05/22/23  REFERRING DIAG:  R53.81 (ICD-10-CM) - Physical deconditioning    THERAPY DIAG:   Other low back pain  Other abnormalities of gait and mobility  Muscle weakness (generalized)  Unsteadiness on feet  Visuospatial deficit  Rationale for Evaluation and Treatment: Rehabilitation  SUBJECTIVE:                                                                                                                                                                                             SUBJECTIVE STATEMENT: Doing good, no falls and not really any pain.   Pt accompanied by: significant other  PERTINENT HISTORY: Patient is a 72  y.o. male here for bilateral knee pain, left arm, low back pain. Patient reports he has lost about 28 pounds over the past year related to his treatment for non-hodgkins lymphoma. Over the past several months he has had several aches and pains. Worst of which is in bilateral anterior knees. Worse with walking, standing for prolonged periods. Using a cane to help get around. No acute injury though balance has been an issue, feels he's going to fall. Also with left anterior upper arm pain. No swelling or bruising and no acute injury. Feels more with carrying items and feels weaker. Left sided low back pain without radiation into his legs. No bowel/bladder dysfunction. No numbness/tingling.  PAIN:  Are you having pain? Yes: NPRS scale: 0/10 Pain location: low back in the middle Pain description: dull, ache, constant  Aggravating factors: walking, sitting, everything hurts  Relieving factors: Tylenol helps a little bit   PRECAUTIONS: None  RED FLAGS: None   WEIGHT BEARING RESTRICTIONS: No  FALLS: Has patient fallen in last 6 months? No  LIVING ENVIRONMENT: Lives with: lives with their family Lives in: House/apartment Stairs: Yes: Internal: 14 steps; on right going up Has following equipment at home: Single point cane and Walker - 2 wheeled  PLOF: Independent with basic ADLs and Independent with household mobility without device  PATIENT GOALS: to  have less pain   OBJECTIVE:   COGNITION: Overall cognitive status: Within functional limits for tasks assessed   SENSATION: WFL  MUSCLE LENGTH: Hamstrings: BLE tightness   POSTURE: rounded shoulders, forward head, and increased thoracic kyphosis  LUMBAR ROM:  limited 50% in all directions with pain    LOWER EXTREMITY ROM:   grossly WFL    LOWER EXTREMITY MMT:  grossly 3+/5 BLE    TRANSFERS: Assistive device utilized: Counselling psychologist  Sit to stand: Modified independence Stand to sit: Modified independence Chair to chair: Modified independence  GAIT: Gait pattern: step to pattern, decreased arm swing- Right, decreased arm swing- Left, decreased step length- Right, decreased step length- Left, decreased stance time- Right, decreased stance time- Left, decreased stride length, narrow BOS, poor foot clearance- Right, and poor foot clearance- Left Distance walked: in clinic distances Assistive device utilized: Single point cane Level of assistance: Modified independence Comments: slowed speed, unsteady and higher risk of fall  FUNCTIONAL TESTS:  5 times sit to stand: 27.12s  Timed up and go (TUG): 26s   TODAY'S TREATMENT:                                                                                                                              DATE:  08/30/23 NuStep L5x53mins  Leg ext 5# 2x10 Leg curls 15# 2x10  STS 2x10 Seated hip abd red 2x10  Ball squeezes 2x10  Side steps against mat table CGA  Leg press 20# 2x10 Shoulder ext 5# 2x10  Chest press 5# 2x10  08/02/23: Therapeutic exercise:  Nustep level 4 Ue's  and LE's 6 min In ll bars for static standing on airex 30 sec, then horizontal head turns, 15 x, needed CGA lost balance with poor righting reaction noted Seated long arc quads 2# cuff weights, 10 x 1 each leg  Leg press, 20# 2 sets 10 reps Supine with green physioball, 65 cm for LTR Alt SLR B heels on ball Bridging 12 reps thighs on ball  Hook  lying for green t band resisted hip abd/ER Sit to stand from hi lo mat feet on airex 5 reps  07/31/23: Therapeutic exercise:  Initially reviewed HEP as he was instructed in at initial session: all for 10 reps, needed cueing and demo to utilize appropriate amplitude of movement Supine for LTR Supine bridging Sit to stand from elevated mat 10 reps Seated long arc quads Seated hip abd/ER with green t band Standing marching Standing alt hip abd  Nustep: 6 min, level 5, Ue's and LE's  Attempted to have patient perform standing balance activities in ll bars but he stated that he needed to stop due to GI upset, and discontinued session.  Assisted him to bathroom and informed son who was in lobby  EVAL 07/25/23    PATIENT EDUCATION: Education details: POC and HEP Person educated: Patient Education method: Explanation Education comprehension: verbalized understanding  HOME EXERCISE PROGRAM: Access Code: [email or web address] URL: [email or web address] Date: 07/25/2023 Prepared by: Cassie Freer  Exercises - Supine Bridge  - 1 x daily - 7 x weekly - 2 sets - 10 reps - Supine Lower Trunk Rotation  - 1 x daily - 7 x weekly - 2 sets - 10 reps - Sit to Stand with Armchair  - 1 x daily - 7 x weekly - 2 sets - 10 reps - Seated Hip Abduction with Resistance  - 1 x daily - 7 x weekly - 2 sets - 10 reps - Seated Knee Lifts with Resistance  - 1 x daily - 7 x weekly - 2 sets - 10 reps - Standing March with Counter Support  - 1 x daily - 7 x weekly - 2 sets - 10 reps - Standing Hip Abduction with Counter Support  - 1 x daily - 7 x weekly - 2 sets - 10 reps   GOALS: Goals reviewed with patient? Yes  SHORT TERM GOALS: Target date: 09/05/23  Patient will be independent with initial HEP.  Goal status: INITIAL  2.  Patient will demonstrate TUG <20s  Baseline: 26s Goal status: INITIAL   LONG TERM GOALS: Target date: 10/17/23  Patient will be independent with advanced/ongoing HEP to improve  outcomes and carryover.  Goal status: INITIAL  2.  Patient will report 75% improvement in low back pain to improve QOL.  Baseline: 10/10 Goal status: INITIAL  3.  Patient will demonstrate full pain free lumbar ROM to perform ADLs.   Baseline: 50% decrease Goal status: INITIAL  4.  Patient will demonstrate improved functional strength as demonstrated by 5/5 BLE MMT. Baseline: 3+/5 Goal status: INITIAL  5.  Patient will demonstrate 5xSTS <20s  Baseline: 27.12s Goal status: INITIAL  6.  Patient will demonstrate TUG <15s  Baseline: 26s Goal status: INITIAL   ASSESSMENT:  CLINICAL IMPRESSION: Patient is a 72 y.o. male who participated  today with skilled physical therapy treatment for low back pain and physical deconditioning. He participated in 40 min today. Very weak, loss of overall muscle mass. Worked on mostly LE strengthening. He tolerated today's session well and states he wants to work on  more "muscle exercises" for his arms. CGA-minA needed throughout session due to poor balance. Patient will benefit from skilled PT to address his LBP, balance and gait impairments to increase his overall function and mobility.   OBJECTIVE IMPAIRMENTS: Abnormal gait, decreased balance, decreased coordination, decreased endurance, decreased mobility, difficulty walking, decreased ROM, decreased strength, impaired vision/preception, and pain.   ACTIVITY LIMITATIONS: sitting, standing, squatting, stairs, transfers, and locomotion level  PARTICIPATION LIMITATIONS: cleaning, laundry, driving, shopping, and community activity  PERSONAL FACTORS: Age, Education, Fitness, Past/current experiences, Time since onset of injury/illness/exacerbation, and 3+ comorbidities: DM, cancer, vision deficits, GERD  are also affecting patient's functional outcome.   REHAB POTENTIAL: Good  CLINICAL DECISION MAKING: Stable/uncomplicated  EVALUATION COMPLEXITY: Low  PLAN:  PT FREQUENCY: 2x/week  PT DURATION:  12 weeks  PLANNED INTERVENTIONS: Therapeutic exercises, Therapeutic activity, Neuromuscular re-education, Balance training, Gait training, Patient/Family education, Self Care, Joint mobilization, Stair training, Electrical stimulation, Spinal manipulation, Spinal mobilization, Cryotherapy, Moist heat, and Manual therapy  PLAN FOR NEXT SESSION: low back pain management, LE strengthening, balance   Cassie Freer, PT, DPT 08/30/2023, 5:40 PM

## 2023-09-08 ENCOUNTER — Other Ambulatory Visit: Payer: 59

## 2023-09-08 ENCOUNTER — Ambulatory Visit: Payer: 59 | Admitting: Hematology

## 2023-09-14 ENCOUNTER — Ambulatory Visit: Payer: 59

## 2023-10-09 ENCOUNTER — Telehealth: Payer: Self-pay

## 2023-10-09 NOTE — Telephone Encounter (Signed)
Patient calls nurse line regarding concerns with referral to eye doctor.   He reports that he and his wife were supposed to receive referrals to the same ophthalmologist.   Wife was referred to [facility] care.   Patient has pending referral for ophthalmology.   Please send to [facility].   Veronda Prude, RN

## 2023-10-09 NOTE — Telephone Encounter (Signed)
Patient calls nurse line requesting an apt for tomorrow.   He reports since yesterday he has felt very dizzy. He reports every time he stands up he feels light headed and feels like he may fall down. He reports even with his walker he is not able to ambulate far without having to sit down.   He denies any syncope episodes. He denies any shortness of breath, chest pains, vision changes or headaches.   He reports his fasting blood sugar this morning was 200. He reports he took his insulin and reports "I have been good all day."   He reports compliance with all his daily medications.   Patient advised he should be evaluated by the ED or UC. He reports he does not want to go there due to long wait times.   Patient reports he does not live alone and reports his family will call the ambulance if needed.   Patient scheduled for tomorrow for evaluation.   Strongly encouraged ED visit if symptoms worsen.

## 2023-10-10 ENCOUNTER — Ambulatory Visit (HOSPITAL_COMMUNITY)
Admission: RE | Admit: 2023-10-10 | Discharge: 2023-10-10 | Disposition: A | Discharge status: Home / Self Care | Payer: 59 | Source: Ambulatory Visit | Attending: Family Medicine

## 2023-10-10 ENCOUNTER — Ambulatory Visit: Payer: 59

## 2023-10-10 VITALS — BP 134/46 | HR 70 | Ht 68.0 in | Wt 131.4 lb

## 2023-10-10 DIAGNOSIS — R9431 Abnormal electrocardiogram [ECG] [EKG]: Secondary | ICD-10-CM | POA: Diagnosis not present

## 2023-10-10 DIAGNOSIS — R42 Dizziness and giddiness: Secondary | ICD-10-CM | POA: Diagnosis not present

## 2023-10-10 DIAGNOSIS — I440 Atrioventricular block, first degree: Secondary | ICD-10-CM | POA: Diagnosis not present

## 2023-10-10 LAB — POCT GLYCOSYLATED HEMOGLOBIN (HGB A1C): HbA1c, POC (controlled diabetic range): 8.7 % — AB (ref 0.0–7.0)

## 2023-10-10 NOTE — Progress Notes (Signed)
    SUBJECTIVE:   CHIEF COMPLAINT / HPI:   Dizziness:  Patient has had dizziness that began 11/18.  This is reported as lightheadedness and feels as if he may fall down.  Even with his walker, he is not able to ambulate far without having to sit down.  Denies any syncope.  Fasting blood sugar this morning was 200, and has been taking his insulin as instructed.  Patient was advised that he should be evaluated in the ED via nursing team but patient declined and was scheduled in clinic today.   He notes problems with blood pressure. He reports that he feels that the DBP is contributing to the dizziness.   He does not think he likes his Trulicity, difficulty with injections. Eating and drinking normally. Still taking Metoprolol.   No syncopal episodes but does feel like he could pass out.   Does not feel as if the room is spinning.   Denies chest pain or shortness of breath.   At six this morning, his blood pressure was 120/60s--reports that this is somewhat normal for him.   Has difficulty standing and has to use cane.   PERTINENT  PMH / PSH:  DM2  HTN -previous instructed to stop metoprolol due to low blood pressure CAD  PAD - On statin  MI Non-Hodgkin's Lymphoma - See Dr. Candise Che last note, detailed plan on 08/25/23  OBJECTIVE:   BP (!) 134/46   Pulse 70   Ht 5\' 8"  (1.727 m)   Wt 131 lb 6.4 oz (59.6 kg)   SpO2 98%   BMI 19.98 kg/m    Orthostatics negative  General: Alert and oriented in no apparent distress Heart: Regular rate and rhythm with no murmurs appreciated Lungs: CTA bilaterally, no wheezing Abdomen: Bowel sounds present, no abdominal pain Skin: Warm and dry Neuro: CN III, IV,VI: EOMI CV V: Normal sensation in V1, V2, V3 CVII: Symmetric smile and brow raise CN VIII: Normal hearing CN XI: 5/5 shoulder shrug CN XII: Symmetric tongue protrusion  UE and LE strength 5/5 Normal sensation in UE and LE bilaterally  Ambulates with caution, uses cane, slight  shuffling     ASSESSMENT/PLAN:   Assessment & Plan Dizziness Multiple possible components of the dizziness including medications. STOP metoprolol for now. On Trulicity, component of dehydration? EKG obtained during visit unchanged from previous but possibly dizzy from cardiac component. Will obtain CBC, CMP as well for further assessment. Neurologically intact which is reassuring and makes CVA/TIA less likely. Does not appear to be vestibular dysfunction. Discussed ED precautions as the patient is a HIGH fall risk and is continuously dizzy with difficulty with ambulation. Instructed to keep wife at side with ambulation around house and use caution. If dizziness persists, probably warrants head imaging. Close follow up scheduled with PCP.   Interpreter utilized throughout    Alfredo Martinez, MD [facility]

## 2023-10-10 NOTE — Patient Instructions (Addendum)
It was great to see you today! Thank you for choosing [facility] for your primary care.  Today we addressed: STOP your metoprolol  Check sugars and blood pressure at home  Have someone with you for the time being until the dizziness improves  We will check labs today Please go to ER if not improving over next few days, if you have an episode of passing out, worsening shortness of breath, chest pain   If you haven't already, sign up for My Chart to have easy access to your labs results, and communication with your primary care physician. I recommend that you always bring your medications to each appointment as this makes it easy to ensure you are on the correct medications and helps [place] not miss refills when you need them. Call the clinic at [phone] if your symptoms worsen or you have any concerns.  Please arrive 15 minutes before your appointment to ensure smooth check in process.  We appreciate your efforts in making this happen.  Thank you for allowing me to participate in your care, Alec Martinez, MD 10/10/2023, 3:10 PM PGY-3, [facility]

## 2023-10-11 ENCOUNTER — Encounter: Payer: Self-pay | Admitting: Student

## 2023-10-12 ENCOUNTER — Encounter: Payer: Self-pay | Admitting: Student

## 2023-10-12 LAB — CBC WITH DIFFERENTIAL/PLATELET
Basophils Absolute: 0.1 10*3/uL (ref 0.0–0.2)
Basos: 1 %
EOS (ABSOLUTE): 0.4 10*3/uL (ref 0.0–0.4)
Eos: 3 %
Hematocrit: 40.5 % (ref 37.5–51.0)
Hemoglobin: 12.6 g/dL — ABNORMAL LOW (ref 13.0–17.7)
Immature Grans (Abs): 0 10*3/uL (ref 0.0–0.1)
Immature Granulocytes: 0 %
Lymphs: 39 %
MCH: 25.8 pg — ABNORMAL LOW (ref 26.6–33.0)
MCHC: 31.1 g/dL — ABNORMAL LOW (ref 31.5–35.7)
MCV: 83 fL (ref 79–97)
Monocytes Absolute: 1.2 10*3/uL — ABNORMAL HIGH (ref 0.1–0.9)
Monocytes: 9 %
Neutrophils Absolute: 5.6 10*3/uL — ABNORMAL HIGH (ref 0.7–3.1)
Neutrophils Absolute: 7.2 10*3/uL — ABNORMAL HIGH (ref 1.4–7.0)
Neutrophils: 48 %
Platelets: 251 10*3/uL (ref 150–450)
RBC: 4.88 x10E6/uL (ref 4.14–5.80)
RDW: 14 % (ref 11.6–15.4)
WBC: 14.5 10*3/uL — ABNORMAL HIGH (ref 3.4–10.8)

## 2023-10-12 LAB — COMPREHENSIVE METABOLIC PANEL
ALT: 13 [IU]/L (ref 0–44)
AST: 16 IU/L (ref 0–40)
Albumin: 4 g/dL (ref 3.9–4.9)
Alkaline Phosphatase: 113 [IU]/L (ref 44–121)
BUN/Creatinine Ratio: 13 (ref 10–24)
BUN: 14 mg/dL (ref 8–27)
Bilirubin Total: <0.2 mg/dL (ref 0.0–1.2)
CO2: 23 mmol/L (ref 20–29)
Calcium: 9.1 mg/dL (ref 8.6–10.2)
Chloride: 101 mmol/L (ref 96–106)
Creatinine, Ser: 1.04 mg/dL (ref 0.76–1.27)
Globulin, Total: 3.5 g/dL (ref 1.5–4.5)
Glucose: 270 mg/dL — ABNORMAL HIGH (ref 70–99)
Potassium: 4.9 mmol/L (ref 3.5–5.2)
Sodium: 139 mmol/L (ref 134–144)
Total Protein: 7.5 g/dL (ref 6.0–8.5)
eGFR: 77 mL/min/{1.73_m2} (ref >59)

## 2023-10-12 NOTE — Progress Notes (Signed)
Persistent leukocytosis-has seen onc recently  Anemia stable  CMP - nml  A1C - 8.7, recently had an increase in Trulicity with metformin and Jardiance. Worry there is a component of dehydration causing his dizziness, so will not yet increase GLP1 until acute symptoms resolve. Will send letter, no new medication changes   Instructed in letter to bring ALL medications to next visit.

## 2023-10-16 ENCOUNTER — Telehealth: Payer: Self-pay

## 2023-10-16 NOTE — Telephone Encounter (Signed)
Patient calls nurse line regarding results from visit last week.   Provided patient with results per letter from Dr. Jena Gauss.   Patient reports that he is continuing to have dizziness. He reports that it is not worsening, just persisting. He states that he did stop the metoprolol and has been drinking lots of water over the weekend.   Patient states that he would like to come in this week for follow up. Scheduled patient on Wednesday afternoon with Dr. Barbaraann Faster. Discussed ED precautions between now and appointment.   Patient verbalizes understanding.   Veronda Prude, RN

## 2023-10-18 ENCOUNTER — Encounter: Payer: Self-pay | Admitting: Student

## 2023-10-18 ENCOUNTER — Ambulatory Visit: Payer: 59 | Admitting: Student

## 2023-10-18 VITALS — BP 116/48 | HR 83 | Ht 68.0 in | Wt 129.4 lb

## 2023-10-18 DIAGNOSIS — R2689 Other abnormalities of gait and mobility: Secondary | ICD-10-CM | POA: Diagnosis not present

## 2023-10-18 NOTE — Progress Notes (Signed)
  SUBJECTIVE:   CHIEF COMPLAINT / HPI:   Dizziness -Seen 11/19 for dizziness, EKG normal, CBC & CMP normal, stopped metoprolol, but continues to have dizziness.  Report's not dizzy but issues with balance when walking. Feels unstable and like he might fall. Has stopped metop and drank lots of water with no benefit. Normal bathroom habits, no systemic symptoms. Says this has been an issue since 11/9  PERTINENT  PMH / PSH:    OBJECTIVE:  There were no vitals taken for this visit. Physical Exam Neurological:     Mental Status: He is alert.     Cranial Nerves: Cranial nerves 2-12 are intact. No cranial nerve deficit, dysarthria or facial asymmetry.     Sensory: Sensation is intact. No sensory deficit.     Motor: Atrophy present. No weakness, tremor or pronator drift.     Coordination: Romberg sign negative. Finger-Nose-Finger Test and Heel to [facility] Test normal.     Gait: Gait normal.     Deep Tendon Reflexes: Babinski sign absent on the right side. Babinski sign absent on the left side.     Reflex Scores:      Patellar reflexes are 0 on the right side and 0 on the left side.      Achilles reflexes are 0 on the right side and 0 on the left side.     ASSESSMENT/PLAN:   Assessment & Plan Balance problem Pt comes in for balance issues that have been a problem for ~ 1 month. He feels fine most of the time but feels very unsteady while getting up/walking, but his balance is fine while standing. Patient has enlarged lymphnode in neck that was noted on MRI 05/30/23, he was seen by his oncologist, but no comment was made about this. Concern that this may be causing patient's issues/representing a recurrence of his lymphoma. Patient has normal neuro exam and provider was unable to elicit reflexes. Will message oncologist and have patient get brain MRI. -MRI W/WO Brain -Provider to message oncologist No follow-ups on file. Bess Kinds, MD 10/18/2023, 1:17 PM PGY-3, [facility]

## 2023-10-18 NOTE — Patient Instructions (Addendum)
It was great to see you! Thank you for allowing me to participate in your care!  I recommend that you always bring your medications to each appointment as this makes it easy to ensure we are on the correct medications and helps [place] not miss when refills are needed.  Our plans for today:  - Balance Issues I'm not sure what is causing your balance issues, but we are going to get some imaging and send you to Physical Therapy for balance training.  - Brain MRI  Thursday December 5th at 1:30 pm  Location  [facility] Address: [address] [place], [place], [place] [address] Phone: [phone]   - Physical Therapy  For balance training. Someone should reach out to you within 2 weeks. If you have not heard anything by then, call the clinic and ask about the referral.  I'm also going to reach out to your oncologist, to let him know about the balance issues.    Take care and seek immediate care sooner if you develop any concerns.   Dr. Bess Kinds, MD [facility]

## 2023-10-26 ENCOUNTER — Ambulatory Visit (HOSPITAL_COMMUNITY)
Admission: RE | Admit: 2023-10-26 | Discharge: 2023-10-26 | Disposition: A | Discharge status: Home / Self Care | Payer: 59 | Source: Ambulatory Visit | Attending: Family Medicine | Admitting: Family Medicine

## 2023-10-26 DIAGNOSIS — R2689 Other abnormalities of gait and mobility: Secondary | ICD-10-CM | POA: Insufficient documentation

## 2023-10-26 MED ORDER — GADOBUTROL 1 MMOL/ML IV SOLN
6.0000 mL | Freq: Once | INTRAVENOUS | Status: AC | PRN
  Administered 2023-10-26: 6 mL via INTRAVENOUS

## 2023-10-26 NOTE — Assessment & Plan Note (Addendum)
Pt comes in for balance issues that have been a problem for ~ 1 month. He feels fine most of the time but feels very unsteady while getting up/walking, but his balance is fine while standing. Patient has enlarged lymphnode in neck that was noted on MRI 05/30/23, he was seen by his oncologist, but no comment was made about this. Concern that this may be causing patient's issues/representing a recurrence of his lymphoma. Patient has normal neuro exam and provider was unable to elicit reflexes. Will message oncologist and have patient get brain MRI. -MRI W/WO Brain -Provider to message oncologist

## 2023-10-29 ENCOUNTER — Other Ambulatory Visit: Payer: Self-pay | Admitting: Student

## 2023-10-29 ENCOUNTER — Other Ambulatory Visit: Payer: Self-pay | Admitting: Hematology

## 2023-10-29 DIAGNOSIS — I2510 Atherosclerotic heart disease of native coronary artery without angina pectoris: Secondary | ICD-10-CM

## 2023-10-29 DIAGNOSIS — Z794 Long term (current) use of insulin: Secondary | ICD-10-CM

## 2023-10-29 DIAGNOSIS — I252 Old myocardial infarction: Secondary | ICD-10-CM

## 2023-10-29 DIAGNOSIS — C8590 Non-Hodgkin lymphoma, unspecified, unspecified site: Secondary | ICD-10-CM

## 2023-10-30 ENCOUNTER — Ambulatory Visit: Payer: Self-pay | Admitting: Student

## 2023-11-02 ENCOUNTER — Telehealth: Payer: Self-pay | Admitting: Student

## 2023-11-02 ENCOUNTER — Other Ambulatory Visit: Payer: Self-pay | Admitting: Family Medicine

## 2023-11-02 DIAGNOSIS — E119 Type 2 diabetes mellitus without complications: Secondary | ICD-10-CM

## 2023-11-02 NOTE — Telephone Encounter (Signed)
Patient is calling and would like for Dr. Melissa Noon to call him with results from MRI.   The best call back is [phone]

## 2023-11-06 NOTE — Telephone Encounter (Signed)
Patient returns call to nurse line regarding results. Advised that these are still pending and explained delays in imaging results.   Advised that we would contact patient once we have these results.   Veronda Prude, RN

## 2023-11-16 NOTE — Telephone Encounter (Signed)
Patient returns call to nurse line in regards to results.   MRI report is back and he would like to be contacted as soon as possible to discuss.   Will forward to ordering provider.

## 2023-11-17 ENCOUNTER — Other Ambulatory Visit: Payer: Self-pay | Admitting: Student

## 2023-11-17 DIAGNOSIS — R2689 Other abnormalities of gait and mobility: Secondary | ICD-10-CM

## 2023-11-17 NOTE — Progress Notes (Signed)
Placing referral to neurologist to determine balance issues with patient, who had normal MRI. Suspect possible neuropathy at hand.

## 2023-11-29 ENCOUNTER — Encounter: Payer: Self-pay | Admitting: Neurology

## 2023-12-04 ENCOUNTER — Other Ambulatory Visit: Payer: Self-pay

## 2023-12-08 ENCOUNTER — Other Ambulatory Visit: Payer: Self-pay

## 2023-12-08 DIAGNOSIS — E119 Type 2 diabetes mellitus without complications: Secondary | ICD-10-CM

## 2023-12-08 DIAGNOSIS — E11311 Type 2 diabetes mellitus with unspecified diabetic retinopathy with macular edema: Secondary | ICD-10-CM

## 2023-12-08 DIAGNOSIS — E11319 Type 2 diabetes mellitus with unspecified diabetic retinopathy without macular edema: Secondary | ICD-10-CM

## 2023-12-08 DIAGNOSIS — I2510 Atherosclerotic heart disease of native coronary artery without angina pectoris: Secondary | ICD-10-CM

## 2023-12-08 MED ORDER — ROSUVASTATIN CALCIUM 10 MG PO TABS: 10.0000 mg | ORAL_TABLET | Freq: Every day | ORAL | 0 refills | Status: DC

## 2023-12-08 MED ORDER — TRESIBA FLEXTOUCH 100 UNIT/ML ~~LOC~~ SOPN
PEN_INJECTOR | SUBCUTANEOUS | 3 refills | Status: DC
Start: 2023-12-08 — End: 2024-01-08

## 2023-12-08 MED ORDER — NITROGLYCERIN 0.4 MG SL SUBL
0.4000 mg | SUBLINGUAL_TABLET | SUBLINGUAL | 12 refills | Status: DC | PRN
Start: 2023-12-08 — End: 2024-01-08

## 2023-12-08 MED ORDER — RELION PEN NEEDLES 31G X 6 MM MISC
3 refills | Status: AC
Start: 2023-12-08 — End: ?

## 2023-12-08 MED ORDER — ACCU-CHEK GUIDE W/DEVICE KIT: 1.0000 | PACK | Freq: Four times a day (QID) | 0 refills | Status: DC | PRN

## 2023-12-08 MED ORDER — METFORMIN HCL 1000 MG PO TABS: 1000.0000 mg | ORAL_TABLET | Freq: Two times a day (BID) | ORAL | 0 refills | Status: DC

## 2023-12-08 MED ORDER — EMPAGLIFLOZIN 25 MG PO TABS: 25.0000 mg | ORAL_TABLET | Freq: Every day | ORAL | 0 refills | Status: DC

## 2023-12-08 MED ORDER — ACCU-CHEK FASTCLIX LANCET KIT: PACK | 0 refills | Status: DC

## 2023-12-08 MED ORDER — TRULICITY 1.5 MG/0.5ML ~~LOC~~ SOAJ: 1.5000 mg | SUBCUTANEOUS | 0 refills | Status: DC

## 2023-12-11 ENCOUNTER — Telehealth: Payer: Self-pay

## 2023-12-11 NOTE — Telephone Encounter (Signed)
Pharmacy [facility] sent medical clarification request form for medication Trulicity and for Meter and Lancets.  Placed medical clarification request forms in PCP's box.  Drusilla Kanner, CMA

## 2024-01-08 ENCOUNTER — Telehealth: Payer: Self-pay

## 2024-01-08 DIAGNOSIS — E11311 Type 2 diabetes mellitus with unspecified diabetic retinopathy with macular edema: Secondary | ICD-10-CM

## 2024-01-08 DIAGNOSIS — I252 Old myocardial infarction: Secondary | ICD-10-CM

## 2024-01-08 DIAGNOSIS — I2510 Atherosclerotic heart disease of native coronary artery without angina pectoris: Secondary | ICD-10-CM

## 2024-01-08 DIAGNOSIS — E119 Type 2 diabetes mellitus without complications: Secondary | ICD-10-CM

## 2024-01-08 NOTE — Telephone Encounter (Signed)
Patient calls nurse line regarding prescriptions.   He is requesting that all refills of his medications be sent to [facility] on [address].   Called and cancelled existing refills at [facility].   Pended refills to encounter.   Veronda Prude, RN

## 2024-01-09 ENCOUNTER — Ambulatory Visit (INDEPENDENT_AMBULATORY_CARE_PROVIDER_SITE_OTHER): Payer: 59 | Admitting: Neurology

## 2024-01-09 ENCOUNTER — Encounter: Payer: Self-pay | Admitting: Neurology

## 2024-01-09 VITALS — BP 115/69 | HR 82 | Ht 68.0 in | Wt 129.0 lb

## 2024-01-09 DIAGNOSIS — E1142 Type 2 diabetes mellitus with diabetic polyneuropathy: Secondary | ICD-10-CM | POA: Diagnosis not present

## 2024-01-09 DIAGNOSIS — R278 Other lack of coordination: Secondary | ICD-10-CM | POA: Diagnosis not present

## 2024-01-09 NOTE — Progress Notes (Unsigned)
Alec York   Date: 01/09/2024   Alec York MRN: [id number] DOB: [birth year]   Dear Dr. Manson Passey:  Thank you for your kind referral of Alec York for consultation of imbalance. Although his history is well known to you, please allow [place] to reiterate it for the purpose of our medical record. The York was accompanied to the clinic by son and wife who also provides collateral information.     Alec York is a 73 y.o. right-handed male with diabetes mellitus, hypertension, hyperlipidemia, CAD, and non-Hodgkin's lymphoma presenting for evaluation of gait imbalance.   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  Diabetic neuropathy manifesting with numbness and sensory ataxia.  I had extensive discussion with the York regarding the pathogenesis, etiology, management, and natural course of neuropathy. I discussed that there is no treatment to cure neuropathy, and management is symptomatic.  Encouraged tight diabetes management and continued B12 supplementation.  York educated on daily foot inspection, fall prevention, and safety precautions around the home.  I recommend that he have handrails installed in the bathroom.    He has chronic low back pain which is most likely due to lumbar degenerative disease.  He completed PT with no benefit.  He does not have any hyperreflexia or upper motor neuron signs to suggest central canal stenosis, therefore MRI lumbar spine would be of low value at this time.  He prefers conservative therapies.  Recommend using a rollator as needed.   Return to clinic in as needed  ------------------------------------------------------------- History of present illness: Starting in 2022, he began having low back pain and after having a fall and sooner after he developed imbalance.  He reports  constant numbness involving both feet and lower legs.  His feet can feel cold.  He denies tingling, burning or pain in the legs.  Low back pain is achy.  He completed PT but did not appreciate any benefit.  He has been diabetic for many years which was poorly controlled for a long time.  His last HbA1c is 8.7.  He does not drink alcohol.  Labs indicated vitamin B12 deficiency for which he is taking oral supplementation.   Out-side paper records, electronic medical record, and images have been reviewed where available and summarized as:  Lab Results  Component Value Date   HGBA1C 8.7 (A) 10/10/2023   Lab Results  Component Value Date   VITAMINB12 128 (L) 08/25/2023   MRI brain wwo contrast 11/08/2023:  No acute intracranial abnormality or mass.   Past Medical History:  Diagnosis Date   Allergy    Coronary artery disease    Diabetes mellitus without complication (HCC)    Diabetic retinopathy (HCC)    Disabled 2/2 retinopathy   GERD (gastroesophageal reflux disease)    Hyperlipidemia    Hypertension     Past Surgical History:  Procedure Laterality Date   CARDIAC CATHETERIZATION     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   CABG x 4 [place]     Medications:  Outpatient Encounter Medications as of 01/09/2024  Medication Sig   Accu-Chek FastClix Lancets MISC Use to test sugars up to 4 times daily.  Dx Code: E11.319   Accu-Chek Softclix Lancets lancets Use as instructed   aspirin EC 81 MG tablet Take 1 tablet (81 mg total) by mouth daily. Swallow whole.   b complex vitamins capsule Take 1 capsule by mouth daily.  blood glucose meter kit and supplies KIT Dispense based on York and insurance preference. Use up to four times daily as directed. 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 Use to test sugars up to 4 times daily.  Dx Code: E11.319   Blood Pressure Monitoring (BLOOD PRESSURE  CUFF) MISC 1 Units by Does not apply route daily.   Cyanocobalamin (B-12) 1000 MCG SUBL Place 1,000 mcg under the tongue daily.   Dulaglutide (TRULICITY) 1.5 MG/0.5ML SOAJ Inject 1.5 mg into the skin once a week. INJECT 1.5MG  INTO THE SKIN ONCE WEEKLY   empagliflozin (JARDIANCE) 25 MG TABS tablet Take 1 tablet (25 mg total) by mouth daily.   EQ ALLERGY RELIEF, CETIRIZINE, 10 MG tablet Take 1 tablet by mouth once daily   FERREX 150 150 MG capsule Take 1 capsule by mouth once daily   glucose blood (ACCU-CHEK GUIDE) test strip Use to test sugars up to 4 times daily.  Dx Code: E11.319   hydrocortisone cream 0.5 % Apply 1 application. topically 2 (two) times daily.   insulin degludec (TRESIBA FLEXTOUCH) 100 UNIT/ML FlexTouch Pen INJECT 15 UNITS SUBCUTANEOUSLY ONCE DAILY   Insulin Pen Needle (RELION PEN NEEDLES) 31G X 6 MM MISC USE AS DIRECTED TO  CHECK  BLOOD  SUGAR   Lancet Devices (ACCU-CHEK SOFTCLIX) lancets Use as instructed up to 4 times daily.  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 (ACCU-CHEK FASTCLIX LANCET) KIT Use to test sugars up to 4 times daily.  Dx Code: E11.319   metFORMIN (GLUCOPHAGE) 1000 MG tablet Take 1 tablet (1,000 mg total) by mouth 2 (two) times daily.   nitroGLYCERIN (NITROSTAT) 0.4 MG SL tablet Place 1 tablet (0.4 mg total) under the tongue every 5 (five) minutes as needed for chest pain.   rosuvastatin (CRESTOR) 10 MG tablet Take 1 tablet (10 mg total) by mouth daily.   cholecalciferol (VITAMIN D3) 25 MCG (1000 UNIT) tablet Take 2,000 Units by mouth daily. (York not taking: Reported on 01/09/2024)   diclofenac Sodium (VOLTAREN) 1 % GEL Apply 2 g topically 4 (four) times daily. (York not taking: Reported on 01/09/2024)   meloxicam (MOBIC) 15 MG tablet Take 1 tablet (15 mg total) by mouth daily. (York not taking: Reported on 01/09/2024)   metoprolol succinate (TOPROL-XL) 25 MG 24 hr tablet Take 1 tablet by mouth once daily (York not taking: Reported on 01/09/2024)    Facility-Administered Encounter Medications as of 01/09/2024  Medication   mirabegron ER (MYRBETRIQ) tablet 25 mg    Allergies: No Known Allergies  Family History: Family History  Problem Relation Age of Onset   Heart disease Mother    Heart disease Father    Heart disease Brother    Hypertension Brother    Diabetes Brother     Social History: Social History   Tobacco Use   Smoking status: Former    Current packs/day: 0.00    Average packs/day: 0.5 packs/day for 39.0 years (19.5 ttl pk-yrs)    Types: Cigarettes    Start date: 50    Quit date: 2010    Years since quitting: 15.1    Passive exposure: Past   Smokeless tobacco: Never  Vaping Use   Vaping status: Never Used  Substance Use Topics   Alcohol use: No    Alcohol/week: 0.0 standard drinks of alcohol   Drug use: No   Social History   Social History  Narrative   York lives with his wife Alec York.)    York has 2 children, one [place] and one [place].    York uses [employer] transportation and Medicare transportation.    College in [place].         Are you right handed or left handed? Right Handed    Are you currently employed ? No    What is your current occupation?   Do you live at home alone? No   Who lives with you? With wife and son Alec York   What type of home do you live in: 1 story or 2 story? Lives in a two story home         Vital Signs:  BP 115/69   Pulse 82   Ht 5\' 8"  (1.727 m)   Wt 129 lb (58.5 kg)   SpO2 99%   BMI 19.61 kg/m   Neurological Exam: MENTAL STATUS including orientation to time, place, person, recent and remote memory, attention span and concentration, language, and fund of knowledge is normal.  Speech is not dysarthric.  CRANIAL NERVES: II:  Reduced visual acuity bilaterally. No visual field defects.     III-IV-VI: Pupils equal round and reactive to light.  Normal conjugate, extra-ocular eye movements in all directions of gaze.  No nystagmus.  No ptosis.   V:   Normal facial sensation.    VII:  Normal facial symmetry and movements.   VIII:  Normal hearing and vestibular function.   IX-X:  Normal palatal movement.   XI:  Normal shoulder shrug and head rotation.   XII:  Normal tongue strength and range of motion, no deviation or fasciculation.  MOTOR:  Generalized loss of muscle bulk throughout, especially in the lower legs.  No fasciculations or abnormal movements.  No pronator drift.   Upper Extremity:  Right  Left  Deltoid  5/5   5/5   Biceps  5/5   5/5   Triceps  5/5   5/5   Wrist extensors  5/5   5/5   Wrist flexors  5/5   5/5   Finger extensors  5/5   5/5   Finger flexors  5/5   5/5   Dorsal interossei  4/5   4/5   Abductor pollicis  5-/5   4/5   Tone (Ashworth scale)  0  0   Lower Extremity:  Right  Left  Hip flexors  5/5   5/5   Knee flexors  5/5   5/5   Knee extensors  5/5   5/5   Dorsiflexors  5-/5   5-/5   Plantarflexors  5-/5   5-/5   Toe extensors  4/5   4/5   Toe flexors  4/5   4/5   Tone (Ashworth scale)  0  0   MSRs:                                           Right        Left brachioradialis 1+  1+  biceps 1+  1+  triceps 1+  1+  patellar 0  0  ankle jerk 0  0  plantar response down  down   SENSORY: Vibration is reduced at the knees and absent below the ankles.  Pin prick and temperature reduced from the knees down.  Sensation intact in the hands.  Rhomberg sign  is positive.   COORDINATION/GAIT: Normal finger-to-nose-finger.  Intact rapid alternating movements bilaterally.  Gait is assisted with cane, wide-based, slow, unsteady with turns.    Thank you for allowing me to participate in York's care.  If I can answer any additional questions, I would be pleased to do so.    Sincerely,    Reganne Messerschmidt K. Allena Katz, DO

## 2024-01-09 NOTE — Patient Instructions (Addendum)
You can try using a Rollator for your balance. You can try this at a medical supply store   Recommend having hand rails placed in the bathroom

## 2024-01-11 ENCOUNTER — Other Ambulatory Visit: Payer: Self-pay | Admitting: Student

## 2024-01-11 MED ORDER — TRULICITY 1.5 MG/0.5ML ~~LOC~~ SOAJ: 1.5000 mg | SUBCUTANEOUS | 0 refills | Status: AC

## 2024-01-11 MED ORDER — TRESIBA FLEXTOUCH 100 UNIT/ML ~~LOC~~ SOPN: PEN_INJECTOR | SUBCUTANEOUS | 3 refills | Status: AC

## 2024-01-11 MED ORDER — METOPROLOL SUCCINATE ER 25 MG PO TB24: 25.0000 mg | ORAL_TABLET | Freq: Every day | ORAL | 0 refills | Status: AC

## 2024-01-11 MED ORDER — NITROGLYCERIN 0.4 MG SL SUBL: 0.4000 mg | SUBLINGUAL_TABLET | SUBLINGUAL | 12 refills | Status: AC | PRN

## 2024-01-11 MED ORDER — ROSUVASTATIN CALCIUM 10 MG PO TABS
10.0000 mg | ORAL_TABLET | Freq: Every day | ORAL | 0 refills | Status: DC
Start: 2024-01-11 — End: 2024-02-21

## 2024-01-11 MED ORDER — EMPAGLIFLOZIN 25 MG PO TABS: 25.0000 mg | ORAL_TABLET | Freq: Every day | ORAL | 0 refills | Status: DC

## 2024-01-11 MED ORDER — METFORMIN HCL 1000 MG PO TABS: 1000.0000 mg | ORAL_TABLET | Freq: Two times a day (BID) | ORAL | 0 refills | Status: DC

## 2024-02-08 ENCOUNTER — Other Ambulatory Visit: Payer: Self-pay | Admitting: Student

## 2024-02-18 ENCOUNTER — Other Ambulatory Visit: Payer: Self-pay | Admitting: Student

## 2024-02-18 DIAGNOSIS — Z794 Long term (current) use of insulin: Secondary | ICD-10-CM

## 2024-02-20 ENCOUNTER — Other Ambulatory Visit: Payer: Self-pay | Admitting: Student

## 2024-02-20 DIAGNOSIS — E11311 Type 2 diabetes mellitus with unspecified diabetic retinopathy with macular edema: Secondary | ICD-10-CM

## 2024-02-20 DIAGNOSIS — I2510 Atherosclerotic heart disease of native coronary artery without angina pectoris: Secondary | ICD-10-CM

## 2024-03-18 ENCOUNTER — Other Ambulatory Visit: Payer: Self-pay | Admitting: Hematology

## 2024-03-18 DIAGNOSIS — C8590 Non-Hodgkin lymphoma, unspecified, unspecified site: Secondary | ICD-10-CM

## 2024-04-05 ENCOUNTER — Other Ambulatory Visit: Payer: Self-pay

## 2024-04-05 DIAGNOSIS — C8590 Non-Hodgkin lymphoma, unspecified, unspecified site: Secondary | ICD-10-CM

## 2024-04-07 NOTE — Progress Notes (Signed)
 HEMATOLOGY/ONCOLOGY CLINIC NOTE  Date of Service: 04/08/2024   Patient Care Team: Dameron, Marisa, DO as PCP - General (Family Medicine) O'Neal, Cathay Clonts, MD as Consulting Physician (Cardiology) Frankie Israel, MD as Consulting Physician (Hematology) Quillian Brunt, MD as Consulting Physician (Pulmonary Disease) Patel, Donika K, DO as Consulting Physician (Neurology)  Vernadine Golas, MD as ENT  CHIEF COMPLAINTS/PURPOSE OF CONSULTATION:  Follow-up for continued evaluation and management of low-grade non-Hodgkin's lymphoma  CURRENT TREATMENT: Watchful observation  PREVIOUS TREATMENT Rituxan  weekly x 4 doses  INTERVAL HISTORY:   Alec York is a 73 y.o. male here for continued evaluation and management of his notable marginal zone lymphoma. Patient was last seen by me on 08/25/2023 and reported decreased cervical lymph nodes and chronic exertion issues related to previous heart issues. Patient is here for follow-up of his marginal zone lymphoma with his son. He notes some balance issues related to his diabetic neuropathy. He notes no new fevers or chills or night sweats.  Notes some of the cervical lymph nodes are a little more prominent.  No sudden weight loss.  Good p.o. intake.  Continues to have chronic anxiety. Plans to travel to [place] towards the end of the year and would like to be seen prior to traveling. Labs done today were discussed in detail with him.   MEDICAL HISTORY:  Past Medical History:  Diagnosis Date   Allergy    Coronary artery disease    Diabetes mellitus without complication (HCC)    Diabetic retinopathy (HCC)    Disabled 2/2 retinopathy   GERD (gastroesophageal reflux disease)    Hyperlipidemia    Hypertension     SURGICAL HISTORY: Past Surgical History:  Procedure Laterality Date   CARDIAC CATHETERIZATION     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   CABG x 4 [place]    SOCIAL HISTORY: Social History   Socioeconomic  History   Marital status: Married    Spouse name: [name]   Number of children: 2   Years of education: 16   Highest education level: Bachelor's degree (e.g., BA, AB, BS)  Occupational History   Occupation: Retired  Tobacco Use   Smoking status: Former    Current packs/day: 0.00    Average packs/day: 0.5 packs/day for 39.0 years (19.5 ttl pk-yrs)    Types: Cigarettes    Start date: 85    Quit date: 2010    Years since quitting: 15.3    Passive exposure: Past   Smokeless tobacco: Never  Vaping Use   Vaping status: Never Used  Substance and Sexual Activity   Alcohol use: No    Alcohol/week: 0.0 standard drinks of alcohol   Drug use: No   Sexual activity: Yes    Birth control/protection: None  Other Topics Concern   Not on file  Social History Narrative   Patient lives with his wife Daymon Evans [facility] patient.)    Patient has 2 children, one [place]  and one [place].    Patient uses [employer] transportation and Medicare transportation.    College in [place].         Are you right handed or left handed? Right Handed    Are you currently employed ? No    What is your current occupation?   Do you live at home alone? No   Who lives with you? With wife and son Rox Cope   What type of home do you live in: 1 story or 2 story? Lives in a two  story home        Social Drivers of Health   Financial Resource Strain: Low Risk  (01/12/2022)   Overall Financial Resource Strain (CARDIA)    Difficulty of Paying Living Expenses: Not hard at all  Food Insecurity: No Food Insecurity (01/12/2022)   Hunger Vital Sign    Worried About Running Out of Food in the Last Year: Never true    Ran Out of Food in the Last Year: Never true  Transportation Needs: No Transportation Needs (01/12/2022)   PRAPARE - Administrator, Civil Service (Medical): No    Lack of Transportation (Non-Medical): No  Physical Activity: Insufficiently Active (01/12/2022)   Exercise Vital Sign    Days of Exercise per Week: 7 days     Minutes of Exercise per Session: 10 min  Stress: No Stress Concern Present (01/12/2022)   [employer] of Occupational Health - Occupational Stress Questionnaire    Feeling of Stress : Not at all  Social Connections: Moderately Isolated (01/12/2022)   Social Connection and Isolation Panel [NHANES]    Frequency of Communication with Friends and Family: Twice a week    Frequency of Social Gatherings with Friends and Family: More than three times a week    Attends Religious Services: Never    Database administrator or Organizations: No    Attends Banker Meetings: Never    Marital Status: Married  Catering manager Violence: Not At Risk (01/12/2022)   Humiliation, Afraid, Rape, and Kick questionnaire    Fear of Current or Ex-Partner: No    Emotionally Abused: No    Physically Abused: No    Sexually Abused: No    FAMILY HISTORY: Family History  Problem Relation Age of Onset   Heart disease Mother    Heart disease Father    Heart disease Brother    Hypertension Brother    Diabetes Brother     ALLERGIES:  has no known allergies.  MEDICATIONS:  Current Outpatient Medications  Medication Sig Dispense Refill   Accu-Chek FastClix Lancets MISC Use to test sugars up to 4 times daily.  Dx Code: E11.319 102 each 12   ACCU-CHEK GUIDE TEST test strip CHECK BLOOD SUGAR UP TO 4 TIMES  DAILY 400 strip 2   Accu-Chek Softclix Lancets lancets USE TO TEST SUGARS UP TO 4 TIMES DAILY 400 each 2   aspirin  EC 81 MG tablet Take 1 tablet (81 mg total) by mouth daily. Swallow whole. 90 tablet 3   b complex vitamins capsule Take 1 capsule by mouth daily. 30 capsule 11   blood glucose meter kit and supplies KIT Dispense based on patient and insurance preference. Use up to four times daily as directed. Dx E11.319 1 each 0   Blood Glucose Monitoring Suppl (ACCU-CHEK GUIDE) w/Device KIT 1 each by Does not apply route 4 (four) times daily as needed. Use to test sugars up to 4 times daily.  Dx  Code: E11.319 1 kit 0   Blood Pressure Monitoring (BLOOD PRESSURE CUFF) MISC 1 Units by Does not apply route daily. 1 each 0   cholecalciferol (VITAMIN D3) 25 MCG (1000 UNIT) tablet Take 2,000 Units by mouth daily. (Patient not taking: Reported on 01/09/2024)     Cyanocobalamin  (B-12) 1000 MCG SUBL Place 1,000 mcg under the tongue daily. 30 tablet 11   diclofenac  Sodium (VOLTAREN ) 1 % GEL Apply 2 g topically 4 (four) times daily. (Patient not taking: Reported on 01/09/2024) 150 g 3  Dulaglutide  (TRULICITY ) 1.5 MG/0.5ML SOAJ Inject 1.5 mg into the skin once a week. INJECT 1.5MG  INTO THE SKIN ONCE WEEKLY 8 mL 0   EQ ALLERGY RELIEF, CETIRIZINE , 10 MG tablet Take 1 tablet by mouth once daily 90 tablet 0   FERREX 150 150 MG capsule Take 1 capsule by mouth once daily 90 capsule 0   hydrocortisone  cream 0.5 % Apply 1 application. topically 2 (two) times daily. 30 g 0   insulin  degludec (TRESIBA  FLEXTOUCH) 100 UNIT/ML FlexTouch Pen INJECT 15 UNITS SUBCUTANEOUSLY ONCE DAILY 15 mL 3   Insulin  Pen Needle (RELION PEN NEEDLES) 31G X 6 MM MISC USE AS DIRECTED TO  CHECK  BLOOD  SUGAR 100 each 3   JARDIANCE  25 MG TABS tablet TAKE 1 TABLET BY MOUTH DAILY 90 tablet 3   Lancet Devices (ACCU-CHEK SOFTCLIX) lancets Use as instructed up to 4 times daily.  Dx E11.319 400 each 3   Lancets Misc. (ACCU-CHEK FASTCLIX LANCET) KIT Use to test sugars up to 4 times daily.  Dx Code: E11.319 1 kit 0   meloxicam  (MOBIC ) 15 MG tablet Take 1 tablet (15 mg total) by mouth daily. (Patient not taking: Reported on 01/09/2024) 30 tablet 2   metFORMIN  (GLUCOPHAGE ) 1000 MG tablet TAKE 1 TABLET BY MOUTH TWICE  DAILY 180 tablet 3   metoprolol  succinate (TOPROL -XL) 25 MG 24 hr tablet Take 1 tablet (25 mg total) by mouth daily. 90 tablet 0   nitroGLYCERIN  (NITROSTAT ) 0.4 MG SL tablet Place 1 tablet (0.4 mg total) under the tongue every 5 (five) minutes as needed for chest pain. 30 tablet 12   rosuvastatin  (CRESTOR ) 10 MG tablet TAKE 1 TABLET BY  MOUTH DAILY 90 tablet 3   Current Facility-Administered Medications  Medication Dose Route Frequency Provider Last Rate Last Admin   mirabegron  ER (MYRBETRIQ ) tablet 25 mg  25 mg Oral Daily Shitarev, Dimitry, MD        REVIEW OF SYSTEMS:    10 Point review of Systems was done is negative except as noted above.   PHYSICAL EXAMINATION: ECOG FS:1 - Symptomatic but completely ambulatory .BP (!) 126/53   Pulse 77   Temp (!) 97.5 F (36.4 C)   Resp 18   Wt 130 lb 4.8 oz (59.1 kg)   SpO2 99%   BMI 19.81 kg/m  GENERAL:alert, in no acute distress and comfortable SKIN: no acute rashes, no significant lesions EYES: conjunctiva are pink and non-injected, sclera anicteric OROPHARYNX: MMM, no exudates, no oropharyngeal erythema or ulceration NECK: supple, no JVD LYMPH:  no palpable lymphadenopathy in the cervical, axillary or inguinal regions LUNGS: clear to auscultation b/l with normal respiratory effort HEART: regular rate & rhythm ABDOMEN:  normoactive bowel sounds , non tender, not distended. Extremity: no pedal edema PSYCH: alert & oriented x 3 with fluent speech NEURO: no focal motor/sensory deficits   LABORATORY DATA:  I have reviewed the data as listed  .    Latest Ref Rng & Units 04/08/2024    9:32 AM 10/10/2023    3:58 PM 08/25/2023   10:54 AM  CBC  WBC 4.0 - 10.5 K/uL 17.0  14.5  12.3   Hemoglobin 13.0 - 17.0 g/dL 65.7  84.6  96.2   Hematocrit 39.0 - 52.0 % 38.1  40.5  41.1   Platelets 150 - 400 K/uL 239  251  267    .CBC    Component Value Date/Time   WBC 17.0 (H) 04/08/2024 0932   WBC 11.1 (H) 05/17/2022  0815   RBC 4.78 04/08/2024 0932   HGB 12.2 (L) 04/08/2024 0932   HGB 12.6 (L) 10/10/2023 1558   HCT 38.1 (L) 04/08/2024 0932   HCT 40.5 10/10/2023 1558   PLT 239 04/08/2024 0932   PLT 251 10/10/2023 1558   MCV 79.7 (L) 04/08/2024 0932   MCV 83 10/10/2023 1558   MCH 25.5 (L) 04/08/2024 0932   MCHC 32.0 04/08/2024 0932   RDW 16.0 (H) 04/08/2024 0932   RDW  14.0 10/10/2023 1558   LYMPHSABS 7.9 (H) 04/08/2024 0932   LYMPHSABS 5.6 (H) 10/10/2023 1558   MONOABS 1.7 (H) 04/08/2024 0932   EOSABS 0.4 04/08/2024 0932   EOSABS 0.4 10/10/2023 1558   BASOSABS 0.1 04/08/2024 0932   BASOSABS 0.1 10/10/2023 1558     .    Latest Ref Rng & Units 04/08/2024    9:32 AM 10/10/2023    3:58 PM 08/25/2023   10:54 AM  CMP  Glucose 70 - 99 mg/dL 409  811  914   BUN 8 - 23 mg/dL 17  14  21    Creatinine 0.61 - 1.24 mg/dL 7.82  9.56  2.13   Sodium 135 - 145 mmol/L 138  139  137   Potassium 3.5 - 5.1 mmol/L 4.4  4.9  4.5   Chloride 98 - 111 mmol/L 102  101  101   CO2 22 - 32 mmol/L 29  23  29    Calcium  8.9 - 10.3 mg/dL 9.1  9.1  08.6   Total Protein 6.5 - 8.1 g/dL 7.3  7.5  8.2   Total Bilirubin 0.0 - 1.2 mg/dL 0.3  <5.7  0.3   Alkaline Phos 38 - 126 U/L 65  113  79   AST 15 - 41 U/L 16  16  15    ALT 0 - 44 U/L 12  13  10     . Lab Results  Component Value Date   LDH 127 04/08/2024    06/18/18 Right Cervical LN Needle/core Biopsy:    05/23/18 Fine Needle Aspiration Flow Cytometry:    RADIOGRAPHIC STUDIES: I have personally reviewed the radiological images as listed and agreed with the findings in the report. No results found.  ASSESSMENT & PLAN:   73 y.o. male with  1. 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 Benign behavior is not established. Bulky adenopathy such as this could relate to lymphoma, or metastatic squamous cell carcinoma. There is no visible extranodal spread of tumor.  -06/18/18 biopsy favored a low grade Marginal Zone Lymphoma -06/15/18 PET/CT revealed Enlarged and  hypermetabolic lymph nodes involving the neck, axilla, subpectoral and mediastinal lymph nodes. No lymphadenopathy below the diaphragm. No findings for osseous lymphoma -06/07/18 ECHO for treatment planning revealed a normal ejection fraction -02/02/21 PET/CT showed unchanged mediastinal lymphadenopathy with similar hypermetabolic activity. Interval development of moderate right and small left pleural effusion R > L.  -06/07/2021 Underwent thoracentesis with removal of 200 ml of straw colored fluid. Cytology revealed predominantly lymphocytic (95%) with 8-9% B-cells present. This is suggestive of involvement of pleural fluid by lymphoma.   6 patient's labs from today were discussed in detail with him  #2 generalized anxiety  related to family stressors with the loss of his son about a year ago and significant trouble medical interventions including CABG and lymphoma treatment. -Stable  #3  Diabetes type 2 Continue follow-up with primary care physician for continued management of his diabetes  #4 balance issues due to diabetic neuropathy  PLAN:  -discussed lab results from today, 04/08/2024, in detail with patient - CBC shows normal hemoglobin of 12.2 normal platelet count.  WBC counts are gradually increasing up to 17k CMP stable LDH within normal limits at 127 Minimal change in his cervical lymphadenopathy. - No other acute new focal symptoms or constitutional symptoms like fevers chills or night sweats. - No overt symptomatic progression or other criteria to initiate treatment for the patient's marginal zone lymphoma at this time. - He was counseled to call us  if there is any increasing shortness of breath since that would be a reason to get a repeat chest x-ray to make sure he is not developing any pleural effusion. - Counseled on management of his anxiety and breathing techniques for relaxation.  Follow-up: RTC with Dr Salomon Cree with labs on 09/16/2024    The total time spent in the  appointment was 20 minutes* .  All of the patient's questions were answered with apparent satisfaction. The patient knows to call the clinic with any problems, questions or concerns.   Jacquelyn Matt MD MS AAHIVMS [facility] Hematology/Oncology Physician [facility]  .*Total Encounter Time as defined by the Centers for Medicare and Medicaid Services includes, in addition to the face-to-face time of a patient visit (documented in the note above) non-face-to-face time: obtaining and reviewing outside history, ordering and reviewing medications, tests or procedures, care coordination (communications with other health care professionals or caregivers) and documentation in the medical record.    I,Mitra Faeizi,acting as a Neurosurgeon for Jacquelyn Matt, MD.,have documented all relevant documentation on the behalf of Jacquelyn Matt, MD,as directed by  Jacquelyn Matt, MD while in the presence of Jacquelyn Matt, MD.  .I have reviewed the above documentation for accuracy and completeness, and I agree with the above. .Avantae Bither Kishore Delesa Kawa MD

## 2024-04-08 ENCOUNTER — Inpatient Hospital Stay (HOSPITAL_BASED_OUTPATIENT_CLINIC_OR_DEPARTMENT_OTHER): Payer: 59 | Admitting: Hematology

## 2024-04-08 ENCOUNTER — Inpatient Hospital Stay: Payer: 59 | Attending: Hematology

## 2024-04-08 VITALS — BP 126/53 | HR 77 | Temp 97.5°F | Resp 18 | Wt 130.3 lb

## 2024-04-08 DIAGNOSIS — C8590 Non-Hodgkin lymphoma, unspecified, unspecified site: Secondary | ICD-10-CM | POA: Diagnosis not present

## 2024-04-08 DIAGNOSIS — E1140 Type 2 diabetes mellitus with diabetic neuropathy, unspecified: Secondary | ICD-10-CM | POA: Insufficient documentation

## 2024-04-08 DIAGNOSIS — Z79899 Other long term (current) drug therapy: Secondary | ICD-10-CM | POA: Insufficient documentation

## 2024-04-08 DIAGNOSIS — Z87891 Personal history of nicotine dependence: Secondary | ICD-10-CM | POA: Diagnosis not present

## 2024-04-08 DIAGNOSIS — F411 Generalized anxiety disorder: Secondary | ICD-10-CM | POA: Insufficient documentation

## 2024-04-08 LAB — CBC WITH DIFFERENTIAL (CANCER CENTER ONLY)
Abs Immature Granulocytes: 0.06 10*3/uL (ref 0.00–0.07)
Basophils Absolute: 0.1 10*3/uL (ref 0.0–0.1)
Basophils Relative: 1 %
Eosinophils Absolute: 0.4 10*3/uL (ref 0.0–0.5)
Eosinophils Relative: 2 %
HCT: 38.1 % — ABNORMAL LOW (ref 39.0–52.0)
Hemoglobin: 12.2 g/dL — ABNORMAL LOW (ref 13.0–17.0)
Immature Granulocytes: 0 %
Lymphocytes Relative: 47 %
Lymphs Abs: 7.9 10*3/uL — ABNORMAL HIGH (ref 0.7–4.0)
MCH: 25.5 pg — ABNORMAL LOW (ref 26.0–34.0)
MCHC: 32 g/dL (ref 30.0–36.0)
MCV: 79.7 fL — ABNORMAL LOW (ref 80.0–100.0)
Monocytes Absolute: 1.7 10*3/uL — ABNORMAL HIGH (ref 0.1–1.0)
Monocytes Relative: 10 %
Neutro Abs: 6.9 10*3/uL (ref 1.7–7.7)
Neutrophils Relative %: 40 %
Platelet Count: 239 10*3/uL (ref 150–400)
RBC: 4.78 MIL/uL (ref 4.22–5.81)
RDW: 16 % — ABNORMAL HIGH (ref 11.5–15.5)
WBC Count: 17 10*3/uL — ABNORMAL HIGH (ref 4.0–10.5)
nRBC: 0 % (ref 0.0–0.2)

## 2024-04-08 LAB — CMP (CANCER CENTER ONLY)
ALT: 12 U/L (ref 0–44)
AST: 16 U/L (ref 15–41)
Albumin: 3.9 g/dL (ref 3.5–5.0)
Alkaline Phosphatase: 65 U/L (ref 38–126)
Anion gap: 7 (ref 5–15)
BUN: 17 mg/dL (ref 8–23)
CO2: 29 mmol/L (ref 22–32)
Calcium: 9.1 mg/dL (ref 8.9–10.3)
Chloride: 102 mmol/L (ref 98–111)
Creatinine: 0.92 mg/dL (ref 0.61–1.24)
GFR, Estimated: >60 mL/min (ref >60)
Glucose, Bld: 199 mg/dL — ABNORMAL HIGH (ref 70–99)
Potassium: 4.4 mmol/L (ref 3.5–5.1)
Sodium: 138 mmol/L (ref 135–145)
Total Bilirubin: 0.3 mg/dL (ref 0.0–1.2)
Total Protein: 7.3 g/dL (ref 6.5–8.1)

## 2024-04-08 LAB — LACTATE DEHYDROGENASE: LDH: 127 U/L (ref 98–192)

## 2024-04-08 LAB — VITAMIN B12: Vitamin B-12: 596 pg/mL (ref 180–914)

## 2024-04-08 LAB — FERRITIN: Ferritin: 36 ng/mL (ref 24–336)

## 2024-04-26 ENCOUNTER — Ambulatory Visit (INDEPENDENT_AMBULATORY_CARE_PROVIDER_SITE_OTHER): Admitting: Student

## 2024-04-26 ENCOUNTER — Encounter: Payer: Self-pay | Admitting: Student

## 2024-04-26 VITALS — BP 127/47 | HR 71 | Ht 68.0 in | Wt 129.8 lb

## 2024-04-26 DIAGNOSIS — R531 Weakness: Secondary | ICD-10-CM

## 2024-04-26 DIAGNOSIS — Z794 Long term (current) use of insulin: Secondary | ICD-10-CM

## 2024-04-26 DIAGNOSIS — E46 Unspecified protein-calorie malnutrition: Secondary | ICD-10-CM

## 2024-04-26 DIAGNOSIS — E11311 Type 2 diabetes mellitus with unspecified diabetic retinopathy with macular edema: Secondary | ICD-10-CM | POA: Diagnosis not present

## 2024-04-26 LAB — POCT GLYCOSYLATED HEMOGLOBIN (HGB A1C): HbA1c, POC (controlled diabetic range): 8.8 % — AB (ref 0.0–7.0)

## 2024-04-26 NOTE — Patient Instructions (Addendum)
 It was great seeing you today.  As we discussed, - Take protein supplement (Fairlife protein shake) to help maintain muscle  - Use honey for cough   If you have any questions or concerns, please feel free to call the clinic.   Have a wonderful day,  Dr. Vallorie Gayer [facility] [phone]

## 2024-04-26 NOTE — Progress Notes (Signed)
    SUBJECTIVE:   CHIEF COMPLAINT / HPI:   Patient is concerned about his over health. 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  Wants to go to [place] in October, but worries he is too weak.  The most important thing to him is weakness and vision changes. Wants to know if there are any vitamins he can take for this.  PERTINENT  PMH / PSH: T2DM, HTN, Stage 4 Non-Hodgkin Lymphoma  OBJECTIVE:   BP (!) 127/47   Pulse 71   Ht 5\' 8"  (1.727 m)   Wt 129 lb 12.8 oz (58.9 kg)   SpO2 99%   BMI 19.74 kg/m   General: Ambulates with rollator, anxious affect, chronically ill appearing CV: RRR Resp: Speaks in full sentences. Intermittent dry cough. Normal WOB and lungs clear throughout. 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 Encouraged high-protein high-calorie supplemental shakes especially in setting of vegetarian diet Check B12 today, just had other labs done with oncology- reviewed PT referral per pt request for general frailty and weakness     Vallorie Gayer, DO [facility]

## 2024-04-28 DIAGNOSIS — E46 Unspecified protein-calorie malnutrition: Secondary | ICD-10-CM | POA: Insufficient documentation

## 2024-04-28 NOTE — Assessment & Plan Note (Addendum)
 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 Encouraged high-protein high-calorie supplemental shakes especially in setting of vegetarian diet Check B12 today, just had other labs done with oncology- reviewed PT referral per pt request for general frailty and weakness

## 2024-05-09 ENCOUNTER — Telehealth: Payer: Self-pay

## 2024-05-09 NOTE — Telephone Encounter (Signed)
 Patient calls nurse line to check status of PT referral.   Advised that referral was still pending and that we could reach out once this has been sent.   Advised patient to allow 3-4 weeks for processing of referrals.   Patient voices understanding.   Elsie Halo, RN

## 2024-05-30 ENCOUNTER — Ambulatory Visit: Admitting: Physical Therapy

## 2024-06-16 ENCOUNTER — Other Ambulatory Visit: Payer: Self-pay | Admitting: Hematology

## 2024-06-16 DIAGNOSIS — C8590 Non-Hodgkin lymphoma, unspecified, unspecified site: Secondary | ICD-10-CM

## 2024-06-28 ENCOUNTER — Telehealth: Payer: Self-pay | Admitting: Hematology

## 2024-07-09 ENCOUNTER — Other Ambulatory Visit: Payer: Self-pay

## 2024-07-09 DIAGNOSIS — I2510 Atherosclerotic heart disease of native coronary artery without angina pectoris: Secondary | ICD-10-CM

## 2024-07-09 DIAGNOSIS — E11311 Type 2 diabetes mellitus with unspecified diabetic retinopathy with macular edema: Secondary | ICD-10-CM

## 2024-07-09 MED ORDER — EMPAGLIFLOZIN 25 MG PO TABS: 25.0000 mg | ORAL_TABLET | Freq: Every day | ORAL | 3 refills | Status: AC

## 2024-07-09 MED ORDER — ROSUVASTATIN CALCIUM 10 MG PO TABS: 10.0000 mg | ORAL_TABLET | Freq: Every day | ORAL | 3 refills | Status: AC

## 2024-08-08 ENCOUNTER — Other Ambulatory Visit: Payer: Self-pay | Admitting: Hematology

## 2024-08-08 DIAGNOSIS — C8590 Non-Hodgkin lymphoma, unspecified, unspecified site: Secondary | ICD-10-CM

## 2024-08-19 ENCOUNTER — Other Ambulatory Visit: Payer: Self-pay

## 2024-08-19 DIAGNOSIS — C8590 Non-Hodgkin lymphoma, unspecified, unspecified site: Secondary | ICD-10-CM

## 2024-08-20 ENCOUNTER — Inpatient Hospital Stay (HOSPITAL_BASED_OUTPATIENT_CLINIC_OR_DEPARTMENT_OTHER): Admitting: Hematology

## 2024-08-20 ENCOUNTER — Inpatient Hospital Stay: Attending: Hematology

## 2024-08-20 VITALS — BP 121/49 | HR 82 | Temp 97.5°F | Resp 18 | Wt 128.8 lb

## 2024-08-20 DIAGNOSIS — F411 Generalized anxiety disorder: Secondary | ICD-10-CM | POA: Diagnosis not present

## 2024-08-20 DIAGNOSIS — C8590 Non-Hodgkin lymphoma, unspecified, unspecified site: Secondary | ICD-10-CM

## 2024-08-20 DIAGNOSIS — E1140 Type 2 diabetes mellitus with diabetic neuropathy, unspecified: Secondary | ICD-10-CM | POA: Diagnosis not present

## 2024-08-20 DIAGNOSIS — Z87891 Personal history of nicotine dependence: Secondary | ICD-10-CM | POA: Insufficient documentation

## 2024-08-20 DIAGNOSIS — Z79899 Other long term (current) drug therapy: Secondary | ICD-10-CM | POA: Diagnosis not present

## 2024-08-20 LAB — CMP (CANCER CENTER ONLY)
ALT: 7 U/L (ref 0–44)
AST: 14 U/L — ABNORMAL LOW (ref 15–41)
Albumin: 3.9 g/dL (ref 3.5–5.0)
Alkaline Phosphatase: 80 U/L (ref 38–126)
Anion gap: 8 (ref 5–15)
BUN: 13 mg/dL (ref 8–23)
CO2: 28 mmol/L (ref 22–32)
Calcium: 9.3 mg/dL (ref 8.9–10.3)
Chloride: 101 mmol/L (ref 98–111)
Creatinine: 0.91 mg/dL (ref 0.61–1.24)
GFR, Estimated: >60 mL/min (ref >60)
Glucose, Bld: 251 mg/dL — ABNORMAL HIGH (ref 70–99)
Potassium: 4.8 mmol/L (ref 3.5–5.1)
Sodium: 137 mmol/L (ref 135–145)
Total Bilirubin: 0.3 mg/dL (ref 0.0–1.2)
Total Protein: 7.5 g/dL (ref 6.5–8.1)

## 2024-08-20 LAB — CBC WITH DIFFERENTIAL (CANCER CENTER ONLY)
Abs Immature Granulocytes: 0.06 K/uL (ref 0.00–0.07)
Basophils Absolute: 0.1 K/uL (ref 0.0–0.1)
Basophils Relative: 1 %
Eosinophils Absolute: 0.3 K/uL (ref 0.0–0.5)
Eosinophils Relative: 2 %
HCT: 38.1 % — ABNORMAL LOW (ref 39.0–52.0)
Hemoglobin: 12 g/dL — ABNORMAL LOW (ref 13.0–17.0)
Immature Granulocytes: 0 %
Lymphocytes Relative: 49 %
Lymphs Abs: 9.1 K/uL — ABNORMAL HIGH (ref 0.7–4.0)
MCH: 25.8 pg — ABNORMAL LOW (ref 26.0–34.0)
MCHC: 31.5 g/dL (ref 30.0–36.0)
MCV: 81.8 fL (ref 80.0–100.0)
Monocytes Absolute: 1.4 K/uL — ABNORMAL HIGH (ref 0.1–1.0)
Monocytes Relative: 7 %
Neutro Abs: 7.8 K/uL — ABNORMAL HIGH (ref 1.7–7.7)
Neutrophils Relative %: 41 %
Platelet Count: 281 K/uL (ref 150–400)
RBC: 4.66 MIL/uL (ref 4.22–5.81)
RDW: 15.3 % (ref 11.5–15.5)
Smear Review: NORMAL
WBC Count: 18.7 K/uL — ABNORMAL HIGH (ref 4.0–10.5)
nRBC: 0 % (ref 0.0–0.2)

## 2024-08-20 LAB — FERRITIN: Ferritin: 53 ng/mL (ref 24–336)

## 2024-08-20 LAB — LACTATE DEHYDROGENASE: LDH: 133 U/L (ref 98–192)

## 2024-08-20 LAB — VITAMIN B12: Vitamin B-12: 2059 pg/mL — ABNORMAL HIGH (ref 180–914)

## 2024-08-20 NOTE — Progress Notes (Signed)
 HEMATOLOGY/ONCOLOGY CLINIC NOTE  Date of Service: 08/20/24    Patient Care Team: Suzen Houston NOVAK, DO as PCP - General (Family Medicine) O'Neal, Darryle Ned, MD as Consulting Physician (Cardiology) Onesimo Emaline Brink, MD as Consulting Physician (Hematology) Kassie Acquanetta Bradley, MD as Consulting Physician (Pulmonary Disease) Patel, Donika K, DO as Consulting Physician (Neurology)  Roark Rush, MD as ENT  CHIEF COMPLAINTS/PURPOSE OF CONSULTATION:  Follow-up for continued evaluation and management of low-grade non-Hodgkin's lymphoma  CURRENT TREATMENT: Watchful observation  PREVIOUS TREATMENT Rituxan  weekly x 4 doses  INTERVAL HISTORY:   Alec York is a wonderful 73 y.o. Whom was last seen on 04/08/2024. He was seen for observation of treatment and continued evaluation and management of low-grade non-Hodgkin's lymphoma.   He presents with a cane. He is accompanied by his Son.   Spleen is normal.  Pt denies infection issues, leg swelling, chest pain or breathing problems, abdominal pains, groin pain.  DM is causing diabetic neuropathy.  He plans to travel to [place] within the year.         Latest Ref Rng & Units 08/20/2024   10:49 AM 04/08/2024    9:32 AM 10/10/2023    3:58 PM  CBC  WBC 4.0 - 10.5 K/uL 18.7  17.0  14.5   Hemoglobin 13.0 - 17.0 g/dL 87.9  87.7  87.3   Hematocrit 39.0 - 52.0 % 38.1  38.1  40.5   Platelets 150 - 400 K/uL 281  239  251       Latest Ref Rng & Units 08/20/2024   10:49 AM 04/08/2024    9:32 AM 10/10/2023    3:58 PM  CMP  Glucose 70 - 99 mg/dL 748  800  729   BUN 8 - 23 mg/dL 13  17  14    Creatinine 0.61 - 1.24 mg/dL 9.08  9.07  8.95   Sodium 135 - 145 mmol/L 137  138  139   Potassium 3.5 - 5.1 mmol/L 4.8  4.4  4.9   Chloride 98 - 111 mmol/L 101  102  101   CO2 22 - 32 mmol/L 28  29  23    Calcium  8.9 - 10.3 mg/dL 9.3  9.1  9.1   Total Protein 6.5 - 8.1 g/dL 7.5  7.3  7.5   Total Bilirubin 0.0 - 1.2 mg/dL 0.3  0.3  <9.7    Alkaline Phos 38 - 126 U/L 80  65  113   AST 15 - 41 U/L 14  16  16    ALT 0 - 44 U/L 7  12  13        MEDICAL HISTORY:  Past Medical History:  Diagnosis Date   Allergy    Coronary artery disease    Diabetes mellitus without complication (HCC)    Diabetic retinopathy (HCC)    Disabled 2/2 retinopathy   GERD (gastroesophageal reflux disease)    Hyperlipidemia    Hypertension     SURGICAL HISTORY: Past Surgical History:  Procedure Laterality Date   CARDIAC CATHETERIZATION     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   CABG x 4 [place]    SOCIAL HISTORY: Social History   Socioeconomic History   Marital status: Married    Spouse name: [name]   Number of children: 2   Years of education: 16   Highest education level: Bachelor's degree (e.g., BA, AB, BS)  Occupational History   Occupation: Retired  Tobacco Use   Smoking status: Former  Current packs/day: 0.00    Average packs/day: 0.5 packs/day for 39.0 years (19.5 ttl pk-yrs)    Types: Cigarettes    Start date: 73    Quit date: 2010    Years since quitting: 15.7    Passive exposure: Past   Smokeless tobacco: Never  Vaping Use   Vaping status: Never Used  Substance and Sexual Activity   Alcohol use: No    Alcohol/week: 0.0 standard drinks of alcohol   Drug use: No   Sexual activity: Yes    Birth control/protection: None  Other Topics Concern   Not on file  Social History Narrative   Patient lives with his wife [facility] patient.)    Patient has 2 children, one [place]  and one [place].    Patient uses [employer] transportation and Medicare transportation.    College in [place].         Are you right handed or left handed? Right Handed    Are you currently employed ? No    What is your current occupation?   Do you live at home alone? No   Who lives with you? With wife and son Libby   What type of home do you live in: 1 story or 2 story? Lives in a two story home        Social Drivers of Health   Financial  Resource Strain: Low Risk  (01/12/2022)   Overall Financial Resource Strain (CARDIA)    Difficulty of Paying Living Expenses: Not hard at all  Food Insecurity: No Food Insecurity (01/12/2022)   Hunger Vital Sign    Worried About Running Out of Food in the Last Year: Never true    Ran Out of Food in the Last Year: Never true  Transportation Needs: No Transportation Needs (01/12/2022)   PRAPARE - Administrator, Civil Service (Medical): No    Lack of Transportation (Non-Medical): No  Physical Activity: Insufficiently Active (01/12/2022)   Exercise Vital Sign    Days of Exercise per Week: 7 days    Minutes of Exercise per Session: 10 min  Stress: No Stress Concern Present (01/12/2022)   [employer] of Occupational Health - Occupational Stress Questionnaire    Feeling of Stress : Not at all  Social Connections: Moderately Isolated (01/12/2022)   Social Connection and Isolation Panel    Frequency of Communication with Friends and Family: Twice a week    Frequency of Social Gatherings with Friends and Family: More than three times a week    Attends Religious Services: Never    Database administrator or Organizations: No    Attends Banker Meetings: Never    Marital Status: Married  Catering manager Violence: Not At Risk (01/12/2022)   Humiliation, Afraid, Rape, and Kick questionnaire    Fear of Current or Ex-Partner: No    Emotionally Abused: No    Physically Abused: No    Sexually Abused: No    FAMILY HISTORY: Family History  Problem Relation Age of Onset   Heart disease Mother    Heart disease Father    Heart disease Brother    Hypertension Brother    Diabetes Brother     ALLERGIES:  has no known allergies.  MEDICATIONS:  Current Outpatient Medications  Medication Sig Dispense Refill   aspirin  EC 81 MG tablet Take 1 tablet (81 mg total) by mouth daily. Swallow whole. 90 tablet 3   b complex vitamins capsule Take 1 capsule by mouth daily.  30 capsule  11   cholecalciferol (VITAMIN D3) 25 MCG (1000 UNIT) tablet Take 2,000 Units by mouth daily. (Patient not taking: Reported on 01/09/2024)     Cyanocobalamin  (B-12) 1000 MCG SUBL Place 1,000 mcg under the tongue daily. 30 tablet 11   diclofenac  Sodium (VOLTAREN ) 1 % GEL Apply 2 g topically 4 (four) times daily. (Patient not taking: Reported on 01/09/2024) 150 g 3   Dulaglutide  (TRULICITY ) 1.5 MG/0.5ML SOAJ Inject 1.5 mg into the skin once a week. INJECT 1.5MG  INTO THE SKIN ONCE WEEKLY 8 mL 0   empagliflozin  (JARDIANCE ) 25 MG TABS tablet Take 1 tablet (25 mg total) by mouth daily. 90 tablet 3   EQ ALLERGY RELIEF, CETIRIZINE , 10 MG tablet Take 1 tablet by mouth once daily 90 tablet 0   FERREX 150 150 MG capsule Take 1 capsule by mouth once daily 60 capsule 0   hydrocortisone  cream 0.5 % Apply 1 application. topically 2 (two) times daily. 30 g 0   insulin  degludec (TRESIBA  FLEXTOUCH) 100 UNIT/ML FlexTouch Pen INJECT 15 UNITS SUBCUTANEOUSLY ONCE DAILY 15 mL 3   Insulin  Pen Needle (RELION PEN NEEDLES) 31G X 6 MM MISC USE AS DIRECTED TO  CHECK  BLOOD  SUGAR 100 each 3   metFORMIN  (GLUCOPHAGE ) 1000 MG tablet TAKE 1 TABLET BY MOUTH TWICE  DAILY 180 tablet 3   metoprolol  succinate (TOPROL -XL) 25 MG 24 hr tablet Take 1 tablet (25 mg total) by mouth daily. 90 tablet 0   nitroGLYCERIN  (NITROSTAT ) 0.4 MG SL tablet Place 1 tablet (0.4 mg total) under the tongue every 5 (five) minutes as needed for chest pain. 30 tablet 12   rosuvastatin  (CRESTOR ) 10 MG tablet Take 1 tablet (10 mg total) by mouth daily. 90 tablet 3   No current facility-administered medications for this visit.    REVIEW OF SYSTEMS:    10 Point review of Systems was done is negative except as noted above.   PHYSICAL EXAMINATION: ECOG FS:1 - Symptomatic but completely ambulatory .BP (!) 121/49   Pulse 82   Temp (!) 97.5 F (36.4 C)   Resp 18   Wt 128 lb 12.8 oz (58.4 kg)   SpO2 98%   BMI 19.58 kg/m    Labs done today were discussed  in detail with him.  GENERAL:alert, in no acute distress and comfortable (+) prominent lymph node right side of neck  SKIN: no acute rashes, no significant lesions  EYES: conjunctiva are pink and non-injected, sclera anicteric OROPHARYNX: MMM, no exudates, no oropharyngeal erythema or ulceration  NECK: supple, no JVD LYMPH:  no palpable lymphadenopathy in the cervical, axillary or inguinal regions LUNGS: clear to auscultation b/l with normal respiratory effort HEART: regular rate & rhythm ABDOMEN:  normoactive bowel sounds , non tender, not distended. Extremity: no pedal edema PSYCH: alert & oriented x 3 with fluent speech NEURO: no focal motor/sensory deficits   LABORATORY DATA:  I have reviewed the data as listed      Latest Ref Rng & Units 08/20/2024   10:49 AM 04/08/2024    9:32 AM 10/10/2023    3:58 PM  CBC  WBC 4.0 - 10.5 K/uL 18.7  17.0  14.5   Hemoglobin 13.0 - 17.0 g/dL 87.9  87.7  87.3   Hematocrit 39.0 - 52.0 % 38.1  38.1  40.5   Platelets 150 - 400 K/uL 281  239  251    CBC    Component Value Date/Time   WBC 18.7 (H) 08/20/2024 1049  WBC 11.1 (H) 05/17/2022 0815   RBC 4.66 08/20/2024 1049   HGB 12.0 (L) 08/20/2024 1049   HGB 12.6 (L) 10/10/2023 1558   HCT 38.1 (L) 08/20/2024 1049   HCT 40.5 10/10/2023 1558   PLT 281 08/20/2024 1049   PLT 251 10/10/2023 1558   MCV 81.8 08/20/2024 1049   MCV 83 10/10/2023 1558   MCH 25.8 (L) 08/20/2024 1049   MCHC 31.5 08/20/2024 1049   RDW 15.3 08/20/2024 1049   RDW 14.0 10/10/2023 1558   LYMPHSABS 9.1 (H) 08/20/2024 1049   LYMPHSABS 5.6 (H) 10/10/2023 1558   MONOABS 1.4 (H) 08/20/2024 1049   EOSABS 0.3 08/20/2024 1049   EOSABS 0.4 10/10/2023 1558   BASOSABS 0.1 08/20/2024 1049   BASOSABS 0.1 10/10/2023 1558   Lab Results  Component Value Date   FERRITIN 53 08/20/2024      Latest Ref Rng & Units 08/20/2024   10:49 AM 04/08/2024    9:32 AM 10/10/2023    3:58 PM  CMP  Glucose 70 - 99 mg/dL 748  800  729   BUN 8  - 23 mg/dL 13  17  14    Creatinine 0.61 - 1.24 mg/dL 9.08  9.07  8.95   Sodium 135 - 145 mmol/L 137  138  139   Potassium 3.5 - 5.1 mmol/L 4.8  4.4  4.9   Chloride 98 - 111 mmol/L 101  102  101   CO2 22 - 32 mmol/L 28  29  23    Calcium  8.9 - 10.3 mg/dL 9.3  9.1  9.1   Total Protein 6.5 - 8.1 g/dL 7.5  7.3  7.5   Total Bilirubin 0.0 - 1.2 mg/dL 0.3  0.3  <9.7   Alkaline Phos 38 - 126 U/L 80  65  113   AST 15 - 41 U/L 14  16  16    ALT 0 - 44 U/L 7  12  13     Lab Results  Component Value Date   LDH 127 04/08/2024    06/18/18 Right Cervical LN Needle/core Biopsy:    05/23/18 Fine Needle Aspiration Flow Cytometry:    RADIOGRAPHIC STUDIES: I have personally reviewed the radiological images as listed and agreed with the findings in the report. No results found.  ASSESSMENT & PLAN:   73 y.o. male with  1. 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 Benign behavior is not established. Bulky adenopathy such as this could relate to lymphoma, or metastatic squamous cell carcinoma. There is no visible extranodal spread of tumor.  -06/18/18 biopsy favored a low grade Marginal Zone Lymphoma -06/15/18 PET/CT revealed Enlarged and hypermetabolic lymph nodes involving the neck, axilla, subpectoral and mediastinal lymph nodes. No lymphadenopathy below the diaphragm. No findings for osseous lymphoma -06/07/18 ECHO for treatment planning revealed a normal ejection fraction -02/02/21 PET/CT showed unchanged mediastinal lymphadenopathy with similar hypermetabolic activity. Interval development of moderate right and small left pleural effusion R > L.   -06/07/2021 Underwent thoracentesis with removal of 200 ml of straw colored fluid. Cytology revealed predominantly lymphocytic (95%) with 8-9% B-cells present. This is suggestive of involvement of pleural fluid by lymphoma.   6 patient's labs from  today were discussed in detail with him  #2 generalized anxiety related to family stressors with the loss of his son about a year ago and significant trouble medical interventions including CABG and lymphoma treatment. -Stable  #3  Diabetes type 2 Continue follow-up with primary care physician for continued management of his diabetes  #4 balance issues due to diabetic neuropathy  PLAN:  - Discussed lab results from today, 08/20/2024, in detail with patient.  - Recommend compression socks to help limit chance of blood clots during any long distance travel '  - Recommends vaccinations: Prevnar 20, RSV and Flu  - Recommend covid vacicnation based on patients ability to tolerate vaccination due ot immune system or diabetes   - Recent labs on 08/20/2024 showed his CBC is within normal limits with a hemoglobin of 12.0 g/dL, WBC count of 81.2 K/uL and a minimal increase in levels from the previous reading of 17.0 K/uL  , platelets of 281K. CMP was stable. Creatinine is within normal limits at 0.91mg /dL. Glucose is 250 mg/dL. - No indication for initiating treatment for his nodal marginal zone lymphoma at this time  Follow-up: Patient will call us  to set up an appointment after he returns from [place].   The total time spent in the appointment was 20 minutes*.  All of the patient's questions were answered with apparent satisfaction. The patient knows to call the clinic with any problems, questions or concerns.   Emaline Saran MD MS AAHIVMS [facility] Hematology/Oncology Physician [facility]  .*Total Encounter Time as defined by the Centers for Medicare and Medicaid Services includes, in addition to the face-to-face time of a patient  visit (documented in the note above) non-face-to-face time: obtaining and reviewing outside history, ordering and reviewing medications, tests or procedures, care coordination (communications with other health care professionals or caregivers) and documentation in the medical record.

## 2024-08-21 ENCOUNTER — Telehealth: Payer: Self-pay

## 2024-08-21 DIAGNOSIS — Z794 Long term (current) use of insulin: Secondary | ICD-10-CM

## 2024-08-21 NOTE — Telephone Encounter (Signed)
 Received call from [facility] regarding glucose meter and supplies. Insurance no longer covers one touch supplies.   They are requesting new prescriptions for Accu-chek meter and supplies.   Pended meter and supplies to this encounter.   Alec JAYSON English, RN

## 2024-08-22 MED ORDER — ACCU-CHEK SOFTCLIX LANCETS MISC: 12 refills | Status: AC

## 2024-08-22 MED ORDER — ACCU-CHEK GUIDE TEST VI STRP: ORAL_STRIP | 12 refills | Status: AC

## 2024-08-22 MED ORDER — ACCU-CHEK GUIDE W/DEVICE KIT: PACK | 0 refills | Status: AC

## 2024-09-16 ENCOUNTER — Other Ambulatory Visit

## 2024-09-16 ENCOUNTER — Ambulatory Visit: Admitting: Internal Medicine

## 2024-09-16 ENCOUNTER — Ambulatory Visit: Admitting: Hematology
